# Patient Record
Sex: Female | Born: 1990 | Race: Black or African American | Hispanic: No | Marital: Single
Health system: Midwestern US, Community
[De-identification: ages and names within clinical notes are randomized; demographics above are authoritative.]

## PROBLEM LIST (undated history)

## (undated) ENCOUNTER — Inpatient Hospital Stay (HOSPITAL_COMMUNITY): Payer: Self-pay

## (undated) DIAGNOSIS — J45909 Unspecified asthma, uncomplicated: Secondary | ICD-10-CM

## (undated) DIAGNOSIS — F419 Anxiety disorder, unspecified: Secondary | ICD-10-CM

## (undated) DIAGNOSIS — K589 Irritable bowel syndrome without diarrhea: Secondary | ICD-10-CM

## (undated) DIAGNOSIS — F329 Major depressive disorder, single episode, unspecified: Secondary | ICD-10-CM

## (undated) DIAGNOSIS — R87629 Unspecified abnormal cytological findings in specimens from vagina: Secondary | ICD-10-CM

## (undated) DIAGNOSIS — D649 Anemia, unspecified: Secondary | ICD-10-CM

## (undated) DIAGNOSIS — F32A Depression, unspecified: Secondary | ICD-10-CM

## (undated) DIAGNOSIS — K5909 Other constipation: Secondary | ICD-10-CM

## (undated) HISTORY — PX: NO PAST SURGERIES: SHX2092

---
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
## 2008-05-10 ENCOUNTER — Emergency Department (HOSPITAL_COMMUNITY): Admission: EM | Admit: 2008-05-10 | Discharge: 2008-05-11 | Payer: Self-pay | Admitting: Emergency Medicine

## 2011-02-16 LAB — COMPREHENSIVE METABOLIC PANEL
ALT: 10 U/L (ref 0–35)
Alkaline Phosphatase: 58 U/L (ref 39–117)
BUN: 7 mg/dL (ref 6–23)
CO2: 23 mEq/L (ref 19–32)
GFR calc non Af Amer: >60 mL/min (ref >60)
Glucose, Bld: 89 mg/dL (ref 70–99)
Potassium: 3.6 mEq/L (ref 3.5–5.1)
Sodium: 134 mEq/L — ABNORMAL LOW (ref 135–145)
Total Bilirubin: 0.8 mg/dL (ref 0.3–1.2)

## 2011-02-16 LAB — URINALYSIS, ROUTINE W REFLEX MICROSCOPIC
Bilirubin Urine: NEGATIVE
Glucose, UA: NEGATIVE mg/dL
Ketones, ur: 15 mg/dL — AB
Nitrite: NEGATIVE
Protein, ur: NEGATIVE mg/dL
pH: 6.5 (ref 5.0–8.0)

## 2011-02-16 LAB — URINE MICROSCOPIC-ADD ON

## 2011-02-16 LAB — LIPASE, BLOOD: Lipase: 29 U/L (ref 11–59)

## 2011-02-16 LAB — POCT I-STAT, CHEM 8
Creatinine, Ser: 0.7 mg/dL (ref 0.4–1.2)
HCT: 43 % (ref 36.0–46.0)
Hemoglobin: 14.6 g/dL (ref 12.0–15.0)
Sodium: 142 mEq/L (ref 135–145)
TCO2: 24 mmol/L (ref 0–100)

## 2011-02-16 LAB — CBC
HCT: 40 % (ref 36.0–46.0)
Hemoglobin: 13.3 g/dL (ref 12.0–15.0)
RBC: 4.48 MIL/uL (ref 3.87–5.11)

## 2011-02-16 LAB — DIFFERENTIAL
Basophils Absolute: 0.1 10*3/uL (ref 0.0–0.1)
Basophils Relative: 1 % (ref 0–1)
Eosinophils Absolute: 0 10*3/uL (ref 0.0–0.7)
Neutrophils Relative %: 72 % (ref 43–77)

## 2011-02-16 LAB — POCT PREGNANCY, URINE: Preg Test, Ur: NEGATIVE

## 2012-08-02 NOTE — ED Notes (Signed)
Triage note: pt states she is having pain to bilateral sides and abdominal pain, states she has not been able to have a BM in a few days.

## 2012-08-02 NOTE — ED Provider Notes (Addendum)
HPI Comments:   22 y.o. female presents to the ED C/O constipation and bilateral flank pain starting last night. Her pain got a lot worse tonight so she came in. Pt has not had a BM in the last few days. She has taken Mag Citrate without any help. Pt reports some chronic constipation, states she has to sit on the commode for a long time, frequently has burning pain and notices some blood on the tissue paper. Her LMP was 07/17/12. The patient denies urinary sxs, vaginal pain and discharge and any further symptoms or complaints.     Patient has a past medical history of Asthma. Pt denies hx of DM.   Pt does not have a primary doctor.     Written by Eli Phillips, ED Scribe, as dictated by Cecelia Byars, FNP-C     Patient is a 22 y.o. female presenting with flank pain. The history is provided by the patient. No language interpreter was used.   Flank Pain   This is a new problem. The current episode started yesterday. The problem has been gradually worsening. The problem occurs constantly. Patient reports not work related injury.The pain is associated with no known injury. The pain is present in the left side and right side. The pain is moderate. Associated symptoms include abdominal pain. Pertinent negatives include no abdominal swelling, no bowel incontinence, no perianal numbness, no bladder incontinence, no dysuria, no pelvic pain, no leg pain, no paresthesias, no paresis, no tingling and no weakness.        Past Medical History   Diagnosis Date   ??? Asthma         History reviewed. No pertinent past surgical history.      History reviewed. No pertinent family history.     History     Social History   ??? Marital Status: SINGLE     Spouse Name: N/A     Number of Children: N/A   ??? Years of Education: N/A     Occupational History   ??? Not on file.     Social History Main Topics   ??? Smoking status: Light Tobacco Smoker   ??? Smokeless tobacco: Not on file   ??? Alcohol Use: Yes      Comment: socially   ??? Drug Use: No   ???  Sexually Active: Not on file     Other Topics Concern   ??? Not on file     Social History Narrative   ??? No narrative on file                  ALLERGIES: Review of patient's allergies indicates no known allergies.      Review of Systems   Gastrointestinal: Positive for abdominal pain, constipation, anal bleeding and rectal pain. Negative for nausea, vomiting, abdominal distention and bowel incontinence.   Genitourinary: Positive for flank pain. Negative for bladder incontinence, dysuria, frequency, hematuria, vaginal bleeding, vaginal discharge, difficulty urinating, vaginal pain, menstrual problem and pelvic pain.   Skin: Negative for rash and wound.   Allergic/Immunologic: Negative for immunocompromised state.   Neurological: Negative for tingling, weakness and paresthesias.   Hematological: Does not bruise/bleed easily.   All other systems reviewed and are negative.        Filed Vitals:    08/02/12 2124 08/03/12 0030   BP: 142/70 130/72   Pulse: 91 84   Temp: 99.1 ??F (37.3 ??C)    Resp: 16 16   Height: 5\' 7"  (  1.702 m)    Weight: 67.4 kg (148 lb 9.4 oz)    SpO2: 98% 99%            Physical Exam   Nursing note and vitals reviewed.  Constitutional: She is oriented to person, place, and time. She appears well-developed and well-nourished. No distress.   Pt. Lying quietly on stretcher, texting on phone in NAD.    HENT:   Head: Normocephalic and atraumatic.   Right Ear: External ear normal.   Left Ear: External ear normal.   Nose: Nose normal.   Mouth/Throat: Oropharynx is clear and moist.   Eyes: Conjunctivae and EOM are normal. Pupils are equal, round, and reactive to light.   Neck: Normal range of motion. Neck supple. No JVD present. No tracheal deviation present.   Cardiovascular: Normal rate, regular rhythm, normal heart sounds and intact distal pulses.  Exam reveals no gallop and no friction rub.    No murmur heard.  Pulmonary/Chest: Effort normal and breath sounds normal. No respiratory distress. She has no  wheezes. She has no rales.   Abdominal: Soft. She exhibits no distension and no mass. Bowel sounds are decreased. There is no hepatosplenomegaly. There is generalized tenderness. There is no rebound, no guarding and no CVA tenderness.   Genitourinary: Rectal exam shows internal hemorrhoid and tenderness. Rectal exam shows anal tone normal. Guaiac negative stool.   Musculoskeletal: Normal range of motion. She exhibits no edema and no tenderness.   Neurological: She is alert and oriented to person, place, and time. She has normal reflexes. No cranial nerve deficit.   Skin: Skin is warm and dry. No rash noted.   Psychiatric: She has a normal mood and affect. Her behavior is normal.      RESULTS:  RADIOLOGY FINDINGS  11:39 PM   Abdominal Acute Series with Chest X-ray shows moderate amount of stool in the colon.   Pending review by Radiologist    XR ABD ACUTE W 1 V CHEST    (Results Pending)          LABS REVIEWED:  Labs Reviewed   CBC WITH AUTOMATED DIFF - Abnormal; Notable for the following:     ABS. BASOPHILS 0.1 (*)     All other components within normal limits   METABOLIC PANEL, COMPREHENSIVE - Abnormal; Notable for the following:     BUN 4 (*)     BUN/Creatinine ratio 5 (*)     AST 12 (*)     All other components within normal limits   URINALYSIS W/ RFLX MICROSCOPIC   HCG URINE, QL. - POC   POC URINE PREGNANCY TEST      Recent Results (from the past 12 hour(s))   URINALYSIS W/ RFLX MICROSCOPIC    Collection Time     08/02/12  9:30 PM       Result Value Range    Color YELLOW      Appearance CLEAR      Specific gravity 1.020  1.003 - 1.030      pH (UA) 6.0  5.0 - 8.0      Protein NEGATIVE   NEGATIVE mg/dL    Glucose NEGATIVE   NEGATIVE mg/dL    Ketone NEGATIVE   NEGATIVE mg/dL    Bilirubin NEGATIVE   NEGATIVE    Blood NEGATIVE   NEGATIVE    Urobilinogen 0.2  0.2 - 1.0 EU/dL    Nitrites NEGATIVE   NEGATIVE    Leukocyte Esterase NEGATIVE   NEGATIVE  HCG URINE, QL. - POC    Collection Time     08/02/12  9:39 PM        Result Value Range    Pregnancy test,urine (POC) NEGATIVE   NEGATIVE   CBC WITH AUTOMATED DIFF    Collection Time     08/02/12 10:39 PM       Result Value Range    WBC 7.8  4.6 - 13.2 K/uL    RBC 4.56  4.20 - 5.30 M/uL    HGB 13.7  12.0 - 16.0 g/dL    HCT 16.1  09.6 - 04.5 %    MCV 85.7  74.0 - 97.0 FL    MCH 30.0  24.0 - 34.0 PG    MCHC 35.0  31.0 - 37.0 g/dL    RDW 40.9  81.1 - 91.4 %    PLATELET 235  135 - 420 K/uL    MPV 11.4  9.2 - 11.8 FL    NEUTROPHILS 68  40 - 73 %    LYMPHOCYTES 23  21 - 52 %    MONOCYTES 7  3 - 10 %    EOSINOPHILS 1  0 - 5 %    BASOPHILS 1  0 - 2 %    ABS. NEUTROPHILS 5.3  1.8 - 8.0 K/UL    ABS. LYMPHOCYTES 1.8  0.9 - 3.6 K/UL    ABS. MONOCYTES 0.6  0.05 - 1.2 K/UL    ABS. EOSINOPHILS 0.1  0.0 - 0.4 K/UL    ABS. BASOPHILS 0.1 (*) 0.0 - 0.06 K/UL    DF AUTOMATED     METABOLIC PANEL, COMPREHENSIVE    Collection Time     08/02/12 10:39 PM       Result Value Range    Sodium 144  136 - 145 mmol/L    Potassium 3.7  3.5 - 5.5 mmol/L    Chloride 108  100 - 108 mmol/L    CO2 28  21 - 32 mmol/L    Anion gap 8  3.0 - 18 mmol/L    Glucose 87  74 - 99 mg/dL    BUN 4 (*) 7.0 - 18 MG/DL    Creatinine 7.82  0.6 - 1.3 MG/DL    BUN/Creatinine ratio 5 (*) 12 - 20      GFR est AA >60  >60 ml/min/1.3m2    GFR est non-AA >60  >60 ml/min/1.63m2    Calcium 9.1  8.5 - 10.1 MG/DL    Bilirubin, total 0.3  0.2 - 1.0 MG/DL    ALT 18  95.6 - 21.3 U/L    AST 12 (*) 15 - 37 U/L    Alk. phosphatase 84  50 - 136 U/L    Protein, total 7.5  6.4 - 8.2 g/dL    Albumin 3.9  3.4 - 5.0 g/dL    Globulin 3.6  2.0 - 4.0 g/dL    A-G Ratio 1.1  0.8 - 1.7           MDM     Amount and/or Complexity of Data Reviewed:   Clinical lab tests:  Reviewed and ordered  Tests in the radiology section of CPT??:  Ordered and reviewed  Discussion of test results with the performing providers:  No   Decide to obtain previous medical records or to obtain history from someone other than the patient:  No   Obtain history from someone other than the  patient:  No   Review and summarize  past medical records:  No   Discuss the patient with another provider:  No   Independant visualization of image, tracing, or specimen:  No      Procedures    MEDICATIONS GIVEN:  Medications   magnesium citrate solution 296 mL (296 mL Oral Given 08/03/12 0006)         PROGRESS NOTES:   10:21 PM   Performed initial assessment.     Procedure Note - Rectal Exam:   11:43 PM  Performed by: Cecelia Byars, FNP-C   Rectal exam performed.  Brown stool was collected.  Stool was Hemoccult tested, and found to be heme Negative.   The procedure took 1-15 minutes, and pt tolerated well.       11:48 PM    Derrica Grandpre's  results have been reviewed with her.  She has been counseled regarding her diagnosis, treatment, and plan.  She verbally conveys understanding and agreement of the signs, symptoms, diagnosis, treatment and prognosis and additionally agrees to follow up as discussed.  She also agrees with the care-plan and conveys that all of her questions have been answered.  I have also provided discharge instructions for her that include: educational information regarding their diagnosis and treatment, and list of reasons why they would want to return to the ED prior to their follow-up appointment, should her condition change.       CLINICAL IMPRESSION:    1. Constipation    2. Hemorrhoid              PLAN:  1. D/C home  2. lactulose  3. F/u with PCP, Dr. Cathlean Cower  4. Return if sxs worsen      Written by Eli Phillips, ED Scribe, as dictated by Cecelia Byars, FNP-C      I was personally available for consultation in the emergency department.  I have reviewed the chart and agree with the documentation recorded by the [facility], including the assessment, treatment plan, and disposition.  Darlys Gales, MD

## 2012-08-03 LAB — CBC WITH AUTOMATED DIFF
ABS. BASOPHILS: 0.1 10*3/uL — ABNORMAL HIGH (ref 0.0–0.06)
ABS. EOSINOPHILS: 0.1 10*3/uL (ref 0.0–0.4)
ABS. LYMPHOCYTES: 1.8 10*3/uL (ref 0.9–3.6)
ABS. MONOCYTES: 0.6 10*3/uL (ref 0.05–1.2)
ABS. NEUTROPHILS: 5.3 10*3/uL (ref 1.8–8.0)
BASOPHILS: 1 % (ref 0–2)
EOSINOPHILS: 1 % (ref 0–5)
HCT: 39.1 % (ref 35.0–45.0)
HGB: 13.7 g/dL (ref 12.0–16.0)
LYMPHOCYTES: 23 % (ref 21–52)
MCH: 30 PG (ref 24.0–34.0)
MCHC: 35 g/dL (ref 31.0–37.0)
MCV: 85.7 FL (ref 74.0–97.0)
MONOCYTES: 7 % (ref 3–10)
MPV: 11.4 FL (ref 9.2–11.8)
NEUTROPHILS: 68 % (ref 40–73)
PLATELET: 235 10*3/uL (ref 135–420)
RBC: 4.56 M/uL (ref 4.20–5.30)
RDW: 13.2 % (ref 11.6–14.5)
WBC: 7.8 10*3/uL (ref 4.6–13.2)

## 2012-08-03 LAB — URINALYSIS W/ RFLX MICROSCOPIC
Bilirubin: NEGATIVE
Blood: NEGATIVE
Glucose: NEGATIVE mg/dL
Ketone: NEGATIVE mg/dL
Leukocyte Esterase: NEGATIVE
Nitrites: NEGATIVE
Protein: NEGATIVE mg/dL
Specific gravity: 1.02 (ref 1.003–1.030)
Urobilinogen: 0.2 EU/dL (ref 0.2–1.0)
pH (UA): 6 (ref 5.0–8.0)

## 2012-08-03 LAB — METABOLIC PANEL, COMPREHENSIVE
A-G Ratio: 1.1 (ref 0.8–1.7)
ALT (SGPT): 18 U/L (ref 12.0–78.0)
AST (SGOT): 12 U/L — ABNORMAL LOW (ref 15–37)
Albumin: 3.9 g/dL (ref 3.4–5.0)
Alk. phosphatase: 84 U/L (ref 50–136)
Anion gap: 8 mmol/L (ref 3.0–18)
BUN/Creatinine ratio: 5 — ABNORMAL LOW (ref 12–20)
BUN: 4 MG/DL — ABNORMAL LOW (ref 7.0–18)
Bilirubin, total: 0.3 MG/DL (ref 0.2–1.0)
CO2: 28 mmol/L (ref 21–32)
Calcium: 9.1 MG/DL (ref 8.5–10.1)
Chloride: 108 mmol/L (ref 100–108)
Creatinine: 0.82 MG/DL (ref 0.6–1.3)
GFR est AA: >60 mL/min/{1.73_m2} (ref >60)
GFR est non-AA: >60 mL/min/{1.73_m2} (ref >60)
Globulin: 3.6 g/dL (ref 2.0–4.0)
Glucose: 87 mg/dL (ref 74–99)
Potassium: 3.7 mmol/L (ref 3.5–5.5)
Protein, total: 7.5 g/dL (ref 6.4–8.2)
Sodium: 144 mmol/L (ref 136–145)

## 2012-08-03 LAB — HCG URINE, QL. - POC: Pregnancy test,urine (POC): NEGATIVE

## 2012-08-03 MED ORDER — MAGNESIUM CITRATE ORAL SOLN
ORAL | Status: AC
Start: 2012-08-03 — End: 2012-08-03
  Administered 2012-08-03: 04:00:00 via ORAL

## 2012-08-03 NOTE — ED Notes (Signed)
I have reviewed discharge instructions with the patient.  The patient verbalized understanding.Patient armband removed and shredded.

## 2012-08-05 LAB — URINALYSIS W/ RFLX MICROSCOPIC
Bilirubin: NEGATIVE
Blood: NEGATIVE
Glucose: NEGATIVE mg/dL
Ketone: NEGATIVE mg/dL
Leukocyte Esterase: NEGATIVE
Nitrites: NEGATIVE
Protein: NEGATIVE mg/dL
Specific gravity: 1.015 (ref 1.003–1.030)
Urobilinogen: 0.2 EU/dL (ref 0.2–1.0)
pH (UA): 8.5 — ABNORMAL HIGH (ref 5.0–8.0)

## 2012-08-05 LAB — HCG URINE, QL. - POC: Pregnancy test,urine (POC): NEGATIVE

## 2012-08-05 NOTE — ED Notes (Signed)
"  I was here on Saturday with stomach pain and side pain and trouble going to the bathroom and I still can't go."

## 2012-08-05 NOTE — ED Provider Notes (Signed)
Patient is a 22 y.o. female presenting with abdominal pain.   Abdominal Pain          Past Medical History   Diagnosis Date   ??? Asthma         No past surgical history on file.      No family history on file.     History     Social History   ??? Marital Status: SINGLE     Spouse Name: N/A     Number of Children: N/A   ??? Years of Education: N/A     Occupational History   ??? Not on file.     Social History Main Topics   ??? Smoking status: Light Tobacco Smoker   ??? Smokeless tobacco: Not on file   ??? Alcohol Use: Yes      Comment: socially   ??? Drug Use: No   ??? Sexually Active: Not on file     Other Topics Concern   ??? Not on file     Social History Narrative   ??? No narrative on file                  ALLERGIES: Review of patient's allergies indicates no known allergies.      Review of Systems   Gastrointestinal: Positive for abdominal pain.       Filed Vitals:    08/05/12 1136   BP: 112/73   Pulse: 69   Temp: 98.8 ??F (37.1 ??C)   Resp: 20   Height: 5\' 7"  (1.702 m)   Weight: 67.132 kg (148 lb)   SpO2: 100%            Physical Exam     MDM    Procedures    10:24 PM    I was inadvertently assigned to this patient's treatment team.  I did not see this patient nor did I have any contact with this patient.  I had no involvement during the evaluation, treatment or disposition of this patient.  I am signing off this note to indicate only why my name appeared in the record.  Tami Ribas, MD

## 2012-08-05 NOTE — ED Notes (Signed)
No answer when called to be brought back to treatment room.

## 2014-07-05 ENCOUNTER — Inpatient Hospital Stay
Admit: 2014-07-05 | Discharge: 2014-07-05 | Disposition: A | Discharge status: Home / Self Care | Payer: MEDICAID | Attending: Emergency Medicine

## 2014-07-05 DIAGNOSIS — O200 Threatened abortion: Secondary | ICD-10-CM

## 2014-07-05 LAB — CBC WITH AUTOMATED DIFF
ABS. BASOPHILS: 0 10*3/uL (ref 0.0–0.06)
ABS. EOSINOPHILS: 0.1 10*3/uL (ref 0.0–0.4)
ABS. LYMPHOCYTES: 1.5 10*3/uL (ref 0.9–3.6)
ABS. MONOCYTES: 0.5 10*3/uL (ref 0.05–1.2)
ABS. NEUTROPHILS: 5.7 10*3/uL (ref 1.8–8.0)
BASOPHILS: 0 % (ref 0–2)
EOSINOPHILS: 1 % (ref 0–5)
HCT: 36.3 % (ref 35.0–45.0)
HGB: 12.7 g/dL (ref 12.0–16.0)
LYMPHOCYTES: 19 % — ABNORMAL LOW (ref 21–52)
MCH: 30.5 PG (ref 24.0–34.0)
MCHC: 35 g/dL (ref 31.0–37.0)
MCV: 87.1 FL (ref 74.0–97.0)
MONOCYTES: 7 % (ref 3–10)
MPV: 10.6 FL (ref 9.2–11.8)
NEUTROPHILS: 73 % (ref 40–73)
PLATELET: 231 10*3/uL (ref 135–420)
RBC: 4.17 M/uL — ABNORMAL LOW (ref 4.20–5.30)
RDW: 12.8 % (ref 11.6–14.5)
WBC: 7.7 10*3/uL (ref 4.6–13.2)

## 2014-07-05 LAB — BETA HCG, QT
Beta HCG, QT: 6661 m[IU]/mL — ABNORMAL HIGH (ref 1.0–6.0)
hCG Quant: 6661 m[IU]/mL — ABNORMAL HIGH (ref 1.0–6.0)

## 2014-07-05 LAB — URINALYSIS W/ RFLX MICROSCOPIC
Bilirubin: NEGATIVE
Blood: NEGATIVE
Glucose: NEGATIVE mg/dL
Ketone: NEGATIVE mg/dL
Leukocyte Esterase: NEGATIVE
Nitrites: NEGATIVE
Protein: NEGATIVE mg/dL
Specific gravity: <1.005 — ABNORMAL LOW (ref 1.005–1.030)
Urobilinogen: 0.2 EU/dL (ref 0.2–1.0)
pH (UA): 5.5 (ref 5.0–8.0)

## 2014-07-05 LAB — METABOLIC PANEL, COMPREHENSIVE
A-G Ratio: 1.2 (ref 0.8–1.7)
ALT (SGPT): 22 U/L (ref 13–56)
AST (SGOT): 12 U/L — ABNORMAL LOW (ref 15–37)
Albumin: 4.1 g/dL (ref 3.4–5.0)
Alk. phosphatase: 59 U/L (ref 45–117)
Anion gap: 11 mmol/L (ref 3.0–18)
BUN/Creatinine ratio: 11 — ABNORMAL LOW (ref 12–20)
BUN: 7 MG/DL (ref 7.0–18)
Bilirubin, total: 0.3 MG/DL (ref 0.2–1.0)
CO2: 26 mmol/L (ref 21–32)
Calcium: 8.7 MG/DL (ref 8.5–10.1)
Chloride: 105 mmol/L (ref 100–108)
Creatinine: 0.63 MG/DL (ref 0.6–1.3)
GFR est AA: >60 mL/min/{1.73_m2} (ref >60)
GFR est non-AA: >60 mL/min/{1.73_m2} (ref >60)
Globulin: 3.4 g/dL (ref 2.0–4.0)
Glucose: 81 mg/dL (ref 74–99)
Potassium: 3.5 mmol/L (ref 3.5–5.5)
Protein, total: 7.5 g/dL (ref 6.4–8.2)
Sodium: 142 mmol/L (ref 136–145)

## 2014-07-05 LAB — BLOOD TYPE, (ABO+RH)
ABO/Rh(D): O POS
ABO/Rh: O POS

## 2014-07-05 NOTE — ED Notes (Signed)
Triage note: pt reports being 6 weeks 3 days pregnant. Pt reports she noticed brown discharge while at work, pt then reports she wiped again and it was bright red. Pt also reports slight abdominal cramping. Pt denies saturating pads.   Sepsis Screening completed    (  )Patient meets SIRS criteria.  ( x )Patient does not meet SIRS criteria.      SIRS Criteria is achieved when two or more of the following are present  ? Temperature < 96.8??F (36??C) or > 100.9??F (38.3??C)  ? Heart Rate > 90 beats per minute  ? Respiratory Rate > 20 beats per minute  ? WBC count > 12,000 or <4,000 or > 10% bands      (  )Patient has a suspected source of infection.  ( x )Patient does not have a suspected source of infection.

## 2014-07-05 NOTE — ED Notes (Signed)
Pt given a warm blanket, call light with in reach. Pt reports she just voided as this rn asks her to provide a urine specimen. . Pt verbalizes understanding of providing a specimen as soon as she can.   Pt also informed the provider during the initial exam that pt has had an ultra sound to confirm her pregnancy.

## 2014-07-05 NOTE — ED Provider Notes (Signed)
HPI Comments:   3:16pm    24 y.o. [redacted] weeks pregnant female, G1P0A0 presents to the ED C/O abdominal pain and spotting, onset today. Seen by OB on the 17th, had ultrasound, everything normal. Associated symptoms include back pain. Hx of asthma. Hx of social alcohol and smoking, denies current smoking or drinking. Pt denies any other symptoms or complaints.     The history is provided by the patient. No language interpreter was used.        Past Medical History:   Diagnosis Date   ??? Asthma        History reviewed. No pertinent past surgical history.      History reviewed. No pertinent family history.    History     Social History   ??? Marital Status: SINGLE     Spouse Name: N/A   ??? Number of Children: N/A   ??? Years of Education: N/A     Occupational History   ??? Not on file.     Social History Main Topics   ??? Smoking status: Light Tobacco Smoker   ??? Smokeless tobacco: Not on file   ??? Alcohol Use: Yes      Comment: socially   ??? Drug Use: No   ??? Sexual Activity: Not on file     Other Topics Concern   ??? Not on file     Social History Narrative           ALLERGIES: Review of patient's allergies indicates no known allergies.      Review of Systems   Constitutional: Negative for fever and fatigue.   HENT: Negative for rhinorrhea and sore throat.    Respiratory: Negative for cough and shortness of breath.    Cardiovascular: Negative for chest pain and palpitations.   Gastrointestinal: Positive for abdominal pain. Negative for nausea, vomiting and diarrhea.   Genitourinary: Positive for vaginal bleeding. Negative for dysuria and difficulty urinating.   Musculoskeletal: Positive for back pain. Negative for myalgias and arthralgias.   Skin: Negative for color change and rash.   Neurological: Negative for light-headedness and headaches.   All other systems reviewed and are negative.      Filed Vitals:    07/05/14 1507 07/05/14 1704   BP: 125/86 117/64   Pulse: 94 76   Temp: 99.2 ??F (37.3 ??C)    Resp: 18 18    Height: 5\' 5"  (1.651 m)    Weight: 69.854 kg (154 lb)    SpO2: 98% 99%            Physical Exam   Nursing note and vitals reviewed.   GEN:  Nontoxic appearing; Alert and Oriented;  Appears well hydrated.  SKIN:  Warm and dry, no rashes. No petechia. Good skin turgor.  HEAD:  Normocephalic and Atraumatic;    ENT: Oral mucosa moist, pharynx clear;   NECK:  Supple. Trachea is Midline; No adenopathy.   HEART:  Regular rate and rhythm No murmur. No rubs.  LUNGS:  Clear bilaterally;  Normal respiratory effort,  ABD:  Normoactive bowel sounds.  Soft, slight tenderness suprapubic.  No organomegaly. No hernias.  BACK: No obvious bony spinal defects or changes; Non tender paraspinous muscles  GU: Normal inspection; no rash; NO CVAT  EXT:  No obvious soft tissue tenderness or sites of bony tenderness; Joints- good ROM  NEURO: CN- intact; No focal motor deficits; Cerebellar function- intact; DTR's - 2+     MDM  Number of Diagnoses or Management Options  Diagnosis management comments: INITIAL CLINICAL IMPRESSION and PLANS:  The patient presents with the primary complaint(s) [id number] spotting. The presentation, to include historical aspects and clinical findings are consistent with the DX of Threatened miscarriage. However, other possible DX's to consider as primary, associated with, or exacerbated by include:    Considering the above, my initial management plan to evaluate and therapeutic interventions include the following and as noted in the orders:    1.  Labs:   2.  Imaging:  3.  Medications:     Pennelope Bracken, MD       Amount and/or Complexity of Data Reviewed  Clinical lab tests: ordered and reviewed (CMP, UA, CBC)        Pelvic Exam  Date/Time: 07/05/2014 3:21 PM  Performed by: attending  Exam assisted by:  female present.  Type of exam performed: bimanual and speculum.    External genitalia appearance: normal.    Vaginal exam:  bleeding.    Bleeding: mild   Cervical exam:  normal, os closed and no cervical motion tenderness.    Bimanual exam:  normal and uterine enlargement.    Patient tolerance: Patient tolerated the procedure well with no immediate complications      ED CLINICAL SUMMARY - DISCHARGE     3:24 PM  CLINICAL COURSE while in the ED:      Intervention)s) while in ED:  ACTIONS / APPROACH: Based on the presenting ACUTE history of VAGINAL BLEEDING My initial focus was to Determine the cause and extent of the problem and Initiate Treatment as Appropriate .  Details of actions taken are noted below.       1. DIAGNOSTIC RESULTS:   No orders to display            Labs Reviewed   TOTAL HCG, QT. - Abnormal; Notable for the following:     HCG, Qt. 6661 (*)     All other components within normal limits   CBC WITH AUTOMATED DIFF - Abnormal; Notable for the following:     RBC 4.17 (*)     LYMPHOCYTES 19 (*)     All other components within normal limits   METABOLIC PANEL, COMPREHENSIVE - Abnormal; Notable for the following:     BUN/Creatinine ratio 11 (*)     AST 12 (*)     All other components within normal limits   URINALYSIS W/ RFLX MICROSCOPIC - Abnormal; Notable for the following:     Specific gravity <1.005 (*)     All other components within normal limits   TYPE, ABO & RH         No results found for this or any previous visit (from the past 12 hour(s)).    2. MEDICATIONS GIVEN: Medications - No data to display     Response to Intervention(s):   IMPROVED       Unanticipated Developments: NONE     ED COURSE - General Comment:  During the ED course I had re-evaluated the patient, answered their and /or their family's questions regarding my clinical impression, the patient's condition and plans for therapeutic interventions. The patient's ED course was uneventful and remained stable throughout.    CLINICAL IMPRESSION AND DISCUSSION:   I reviewed our electronic medical record system for any past medical  records that were available that may contribute to the patients current condition, the nursing notes and vital signs from today's visit. Based on the clinical presentation, findings and results of diagnostic studies, as well as  developments while in the ED,  I suspect the following:        For the presentation noted above    The most likely cause or diagnosis is: THREATEN MISCARRAGE        DISCUSSION REGARDING DIAGNOSIS: The specifics regarding my clinical impression / diagnosis are as follows:     At this time there is no clinical evidence to support other pertinent diagnostic considerations such as:   N/A    Finally, other diagnostic considerations during this visit are noted below in Clinical Impression.       Specific Conversations:  NONE      DISPOSITION DECISION:     DISCHARGE: I feel that we have optimized outpatient assessment and management such that Remee Charley is stable to be discharged and to continue with her care or complete any additional evaluation as appropriate at home or as an outpatient. Preparations will be made to discharge the patient.    Present condition at the time of disposition: STABLE    DISCUSSION REGARDING THE DISPOSITION:         DISCHARGE NOTE:    Joei Johndrow's  results have been reviewed with her.  She has been counseled regarding her diagnosis, treatment, and plan.  She verbally conveys understanding and agreement of the signs, symptoms, diagnosis, treatment and prognosis and additionally agrees to follow up as discussed.  She also agrees with the care-plan and conveys that all of her questions have been answered.  I have also provided discharge instructions for her that include: educational information regarding their diagnosis and treatment, and list of reasons why they would want to return to the ED prior to their follow-up appointment, should her condition change.      CLINICAL IMPRESSION  1. Threatened miscarriage in early pregnancy        PLAN:  1. D/C home  2.    Discharge Medication List as of 07/05/2014  4:56 PM      CONTINUE these medications which have NOT CHANGED    Details   hydrocortisone (ANUSOL-HC) 2.5 % rectal cream Insert  into rectum four (4) times daily., Print, Disp-30 g, R-0           3.   Follow-up Information     Follow up With Details Comments Contact Info    [facility] EMERGENCY DEPT  As needed, If symptoms worsen [address]  [place] [address]  [phone]    Betsy Coder, MD  Follow up   [address]  [facility]  [address]  [place] [address]  [phone]          Return if sxs worsen    ATTESTATIONS:  This note is prepared by Tami Ribas, MD, acting as Scribe for Tami Ribas, MD.    Tami Ribas, MD: The scribe's documentation has been prepared under my direction and personally reviewed by me in its entirety. I confirm that the note above accurately reflects all work, treatment, procedures, and medical decision making performed by me.

## 2014-07-05 NOTE — ED Notes (Signed)
I have reviewed discharge instructions with the patient.  The patient verbalized understanding.  Patient armband removed and shredded.

## 2014-11-01 ENCOUNTER — Inpatient Hospital Stay
Admit: 2014-11-01 | Discharge: 2014-11-01 | Disposition: A | Discharge status: Home / Self Care | Payer: MEDICAID | Attending: Emergency Medicine

## 2014-11-01 DIAGNOSIS — S335XXA Sprain of ligaments of lumbar spine, initial encounter: Secondary | ICD-10-CM

## 2014-11-01 LAB — URINALYSIS W/ RFLX MICROSCOPIC
Bilirubin: NEGATIVE
Blood: NEGATIVE
Glucose: NEGATIVE mg/dL
Ketone: NEGATIVE mg/dL
Leukocyte Esterase: NEGATIVE
Nitrites: NEGATIVE
Protein: NEGATIVE mg/dL
Specific gravity: 1.013 (ref 1.005–1.030)
Urobilinogen: 0.2 EU/dL (ref 0.2–1.0)
pH (UA): 5.5 (ref 5.0–8.0)

## 2014-11-01 LAB — HCG URINE, QL: HCG urine, QL: NEGATIVE

## 2014-11-01 MED ORDER — OXYCODONE-ACETAMINOPHEN 5 MG-325 MG TAB
5-325 mg | ORAL_TABLET | ORAL | Status: AC | PRN
Start: 2014-11-01 — End: ?

## 2014-11-01 MED ORDER — OXYCODONE-ACETAMINOPHEN 5 MG-325 MG TAB
5-325 mg | ORAL | Status: DC
Start: 2014-11-01 — End: 2014-11-01

## 2014-11-01 MED ORDER — CYCLOBENZAPRINE 10 MG TAB
10 mg | ORAL_TABLET | Freq: Three times a day (TID) | ORAL | Status: AC | PRN
Start: 2014-11-01 — End: ?

## 2014-11-01 MED FILL — OXYCODONE-ACETAMINOPHEN 5 MG-325 MG TAB: 5-325 mg | ORAL | Qty: 1

## 2014-11-01 NOTE — ED Notes (Signed)
Assumed care for discharge instructions.  Patient is awake, alert x 4 with quite unlabored respirations.  Home care instructions, work note for 3 days, and prescriptions x 2 were explained to patient to include warnings about driving.  Patient was ambulatory to the exit with an even steady gait

## 2014-11-01 NOTE — ED Notes (Signed)
2 weeks back pain, denies injury, increase pain this past week. "i cant get out of bed or get dressed by myself"

## 2014-11-01 NOTE — ED Provider Notes (Signed)
HPI Comments: Cheryl Benton is a  24 y.o. Normal build african [place] female smoker h/o Asthma presents to the ED via POV c/o constant non-radiating lower back pain x 2 weeks, pain increases with movement. Denies fever, chills, dizziness, HA, neck pain/stiffness, cp, sob, palps, cough, leg swelling, abd pain, n/v/d, constipation, brbpr, melena, loss of bowel/bladder control, flank pain, blood in urine, saddle anesthesia, numbness/tingling, weakness, difficulty ambulating.              Patient is a 24 y.o. female presenting with back pain.   Back Pain   Pertinent negatives include no chest pain, no fever, no numbness, no headaches, no abdominal pain, no dysuria and no weakness.        Past Medical History:   Diagnosis Date   ??? Asthma    ??? Miscarriage within last 12 months February 2016       History reviewed. No pertinent past surgical history.      History reviewed. No pertinent family history.    History     Social History   ??? Marital Status: SINGLE     Spouse Name: N/A   ??? Number of Children: N/A   ??? Years of Education: N/A     Occupational History   ??? Not on file.     Social History Main Topics   ??? Smoking status: Light Tobacco Smoker   ??? Smokeless tobacco: Not on file   ??? Alcohol Use: Yes      Comment: socially   ??? Drug Use: No   ??? Sexual Activity: Not on file     Other Topics Concern   ??? Not on file     Social History Narrative         ALLERGIES: Review of patient's allergies indicates no known allergies.    Review of Systems   Constitutional: Negative for fever and chills.   HENT: Negative for ear pain and sore throat.    Eyes: Negative for pain and visual disturbance.   Respiratory: Negative for cough and shortness of breath.    Cardiovascular: Negative for chest pain and palpitations.   Gastrointestinal: Negative for nausea, vomiting, abdominal pain and diarrhea.   Genitourinary: Negative for dysuria, urgency and frequency.   Musculoskeletal: Positive for back pain. Negative for joint swelling,  arthralgias, neck pain and neck stiffness.   Skin: Negative for color change, pallor, rash and wound.   Neurological: Negative for dizziness, weakness, numbness and headaches.   Psychiatric/Behavioral: Negative for behavioral problems. The patient is not nervous/anxious.        Filed Vitals:    11/01/14 0803   BP: 126/82   Pulse: 90   Temp: 97.7 ??F (36.5 ??C)   Resp: 16   Height:  (1.676 m)   Weight: 68.493 kg (151 lb)   SpO2: 98%            Physical Exam   Constitutional: She is oriented to person, place, and time. She appears well-developed and well-nourished. No distress.   Cardiovascular: Normal rate, regular rhythm, normal heart sounds and intact distal pulses.  Exam reveals no gallop and no friction rub.    No murmur heard.  Pulmonary/Chest: Effort normal and breath sounds normal. No respiratory distress. She has no wheezes. She has no rales.   Abdominal: Soft. Bowel sounds are normal. There is no tenderness. There is no rebound and no guarding.   Musculoskeletal:        Cervical back: Normal.  Thoracic back: Normal.        Lumbar back: She exhibits tenderness, pain and spasm. She exhibits normal range of motion, no bony tenderness, no swelling, no edema, no deformity, no laceration and normal pulse.   + Paraspinal muscle bilateral TTP. Normal inspection. No midline TTP. FROM w/ pain on flexion. MS 5/5 throughout BUE/BLE. Normal gait. Neg SLR.    Neurological: She is alert and oriented to person, place, and time. She has normal strength. She is not disoriented. No sensory deficit.   MS 5/5 throughout BUE/BLE. Normal gait.    Skin: Skin is warm and dry. No rash noted. She is not diaphoretic.   Nursing note and vitals reviewed.       MDM  Number of Diagnoses or Management Options  Sprain of lumbar region, initial encounter: new and requires workup  Diagnosis management comments: DDX: Sciatica, Lumbar radiculopathy, Back Sprain, Back Strain, DJD, HNP, Epidural abscess, Cauda Equina Syndrome,  Contusion, Pyelonephritis, Renal Colic, Rib Fracture, Rib Contusion, Costochondritis, Pleurisy, Pleurodynia, Pneumothorax, Thoracic Aneurysm, Abdominal Aortic Aneurysm, Nephro-ureteral Calculus, Acute Myocardial Infarction.     Afebrile, no focal neuro deficits. Normal gait.     Reviewed workup results, any meds, and discharge instructions OR admission plan with patient and any family present.  Answered all questions. Michaelyn Barter, PA  9:47 AM    PLEASE FOLLOW-UP AS DIRECTED WITHOUT FAIL WITHIN THE TIME FRAME RECOMMENDED AS FAILURE TO DO SO COULD RESULT IN WORSENING OF YOUR PHYSICAL CONDITION, DEATH, AND OR PERMANENT DISABILITY.     RETURN TO THE EMERGENCY DEPARTMENT AT IF YOU ARE UNABLE TO FOLLOW-UP AS DIRECTED.     RETURN TO THE EMERGENCY DEPARTMENT AT ONCE IF YOU HAVE SYMPTOMS THAT DO NOT IMPROVE WITH TREATMENT, NEW SYMPTOMS, WORSENING SYMPTOMS, OR ANY OTHER CONCERNS.     THE PATIENT AGREES WITH THE DISCHARGE PLAN AND FOLLOW-UP INSTRUCTIONS. THE PATIENT AGREES TO REVIEW ALL HANDOUTS.          Amount and/or Complexity of Data Reviewed  Clinical lab tests: reviewed and ordered  Tests in the medicine section of CPT??: reviewed and ordered  Decide to obtain previous medical records or to obtain history from someone other than the patient: yes  Review and summarize past medical records: yes  Independent visualization of images, tracings, or specimens: yes    Risk of Complications, Morbidity, and/or Mortality  Presenting problems: moderate  Diagnostic procedures: moderate  Management options: moderate    Patient Progress  Patient progress: stable      Procedures    Diagnosis:   1. Sprain of lumbar region, initial encounter          Disposition: HOME    Follow-up Information     Follow up With Details Comments Contact Info    [facility] EMERGENCY DEPT  As needed, If symptoms worsen [address] [place] [address]  [phone]    [facility] Call today Orthopedic:  Schedule appointment to be seen within 2-3 days.  [address]  [place] [address]  [phone]    Caroleen Hamman, MD Call today Primary Care Physician: Schedule appointment to be seen within 1 week for re-evaluation and establish Primary Care Physician.  [address]    Carola Rhine [place] [address]  [phone]            Current Discharge Medication List      START taking these medications  Details   oxyCODONE-acetaminophen (PERCOCET) 5-325 mg per tablet Take 1 Tab by mouth every four (4) hours as needed (BREAKTHROUGH PAIN ONLY, CAUTION MAY CAUSE DROWSINESS.). Max Daily Amount: 6 Tabs.  Qty: 20 Tab, Refills: 0      cyclobenzaprine (FLEXERIL) 10 mg tablet Take 1 Tab by mouth three (3) times daily as needed for Muscle Spasm(s).  Qty: 20 Tab, Refills: 0         CONTINUE these medications which have NOT CHANGED    Details   multivitamin (ONE A DAY) tablet Take 1 Tab by mouth daily.             Recent Results (from the past 24 hour(s))   URINALYSIS W/ RFLX MICROSCOPIC    Collection Time: 11/01/14  9:10 AM   Result Value Ref Range    Color YELLOW      Appearance CLEAR      Specific gravity 1.013 1.005 - 1.030      pH (UA) 5.5 5.0 - 8.0      Protein NEGATIVE  NEG mg/dL    Glucose NEGATIVE  NEG mg/dL    Ketone NEGATIVE  NEG mg/dL    Bilirubin NEGATIVE  NEG      Blood NEGATIVE  NEG      Urobilinogen 0.2 0.2 - 1.0 EU/dL    Nitrites NEGATIVE  NEG      Leukocyte Esterase NEGATIVE  NEG     HCG URINE, QL    Collection Time: 11/01/14  9:10 AM   Result Value Ref Range    HCG urine, Ql. NEGATIVE  NEG

## 2015-01-22 ENCOUNTER — Inpatient Hospital Stay
Admit: 2015-01-22 | Discharge: 2015-01-23 | Disposition: A | Discharge status: Home / Self Care | Payer: Self-pay | Attending: Emergency Medicine

## 2015-01-22 DIAGNOSIS — S39012A Strain of muscle, fascia and tendon of lower back, initial encounter: Secondary | ICD-10-CM

## 2015-01-22 MED ORDER — METHOCARBAMOL 750 MG TAB
750 mg | ORAL_TABLET | ORAL | 0 refills | Status: AC
Start: 2015-01-22 — End: ?

## 2015-01-22 MED ORDER — IBUPROFEN 600 MG TAB
600 mg | ORAL_TABLET | Freq: Four times a day (QID) | ORAL | 0 refills | Status: AC | PRN
Start: 2015-01-22 — End: ?

## 2015-01-22 MED ORDER — IBUPROFEN 400 MG TAB
400 mg | ORAL | Status: AC
Start: 2015-01-22 — End: 2015-01-22
  Administered 2015-01-22: 21:00:00 via ORAL

## 2015-01-22 MED ORDER — METHOCARBAMOL 750 MG TAB
750 mg | ORAL | Status: AC
Start: 2015-01-22 — End: 2015-01-22
  Administered 2015-01-22: 21:00:00 via ORAL

## 2015-01-22 MED FILL — METHOCARBAMOL 750 MG TAB: 750 mg | ORAL | Qty: 1

## 2015-01-22 MED FILL — IBUPROFEN 400 MG TAB: 400 mg | ORAL | Qty: 1
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
## 2015-03-28 ENCOUNTER — Emergency Department (HOSPITAL_COMMUNITY)
Admission: EM | Admit: 2015-03-28 | Discharge: 2015-03-28 | Discharge status: Against Medical Advice | Payer: Medicaid - Out of State | Attending: Emergency Medicine | Admitting: Emergency Medicine

## 2015-03-28 ENCOUNTER — Encounter (HOSPITAL_COMMUNITY): Payer: Self-pay | Admitting: *Deleted

## 2015-03-28 DIAGNOSIS — F17210 Nicotine dependence, cigarettes, uncomplicated: Secondary | ICD-10-CM | POA: Insufficient documentation

## 2015-03-28 DIAGNOSIS — J45909 Unspecified asthma, uncomplicated: Secondary | ICD-10-CM | POA: Insufficient documentation

## 2015-03-28 DIAGNOSIS — N644 Mastodynia: Secondary | ICD-10-CM | POA: Insufficient documentation

## 2015-03-28 HISTORY — DX: Unspecified asthma, uncomplicated: J45.909

## 2015-03-28 NOTE — ED Notes (Signed)
Pt reports taking a shower this am and noticed white discharge from left breast. Pt reports pain and that discharge is now bloody. No acute distress noted at triage.

## 2015-08-11 ENCOUNTER — Emergency Department (HOSPITAL_COMMUNITY): Payer: Self-pay

## 2015-08-11 ENCOUNTER — Emergency Department (HOSPITAL_COMMUNITY)
Admission: EM | Admit: 2015-08-11 | Discharge: 2015-08-11 | Disposition: A | Discharge status: Home / Self Care | Payer: Self-pay | Attending: Emergency Medicine | Admitting: Emergency Medicine

## 2015-08-11 ENCOUNTER — Encounter (HOSPITAL_COMMUNITY): Payer: Self-pay

## 2015-08-11 DIAGNOSIS — N390 Urinary tract infection, site not specified: Secondary | ICD-10-CM

## 2015-08-11 DIAGNOSIS — R109 Unspecified abdominal pain: Secondary | ICD-10-CM

## 2015-08-11 DIAGNOSIS — K5900 Constipation, unspecified: Secondary | ICD-10-CM

## 2015-08-11 DIAGNOSIS — F17210 Nicotine dependence, cigarettes, uncomplicated: Secondary | ICD-10-CM | POA: Insufficient documentation

## 2015-08-11 DIAGNOSIS — Z3202 Encounter for pregnancy test, result negative: Secondary | ICD-10-CM | POA: Insufficient documentation

## 2015-08-11 DIAGNOSIS — Z79899 Other long term (current) drug therapy: Secondary | ICD-10-CM | POA: Insufficient documentation

## 2015-08-11 DIAGNOSIS — J45909 Unspecified asthma, uncomplicated: Secondary | ICD-10-CM | POA: Insufficient documentation

## 2015-08-11 DIAGNOSIS — R112 Nausea with vomiting, unspecified: Secondary | ICD-10-CM | POA: Insufficient documentation

## 2015-08-11 LAB — URINALYSIS, ROUTINE W REFLEX MICROSCOPIC
Bilirubin Urine: NEGATIVE
GLUCOSE, UA: NEGATIVE mg/dL
Hgb urine dipstick: NEGATIVE
KETONES UR: NEGATIVE mg/dL
Nitrite: POSITIVE — AB
PROTEIN: NEGATIVE mg/dL
Specific Gravity, Urine: 1.011 (ref 1.005–1.030)
pH: 6 (ref 5.0–8.0)

## 2015-08-11 LAB — COMPREHENSIVE METABOLIC PANEL
ALK PHOS: 55 U/L (ref 38–126)
ALT: 8 U/L — AB (ref 14–54)
AST: 12 U/L — AB (ref 15–41)
Albumin: 4.4 g/dL (ref 3.5–5.0)
Anion gap: 9 (ref 5–15)
BILIRUBIN TOTAL: 0.2 mg/dL — AB (ref 0.3–1.2)
BUN: 8 mg/dL (ref 6–20)
CALCIUM: 9.4 mg/dL (ref 8.9–10.3)
CO2: 25 mmol/L (ref 22–32)
CREATININE: 0.5 mg/dL (ref 0.44–1.00)
Chloride: 108 mmol/L (ref 101–111)
Glucose, Bld: 92 mg/dL (ref 65–99)
Potassium: 3.9 mmol/L (ref 3.5–5.1)
Sodium: 142 mmol/L (ref 135–145)
TOTAL PROTEIN: 7.1 g/dL (ref 6.5–8.1)

## 2015-08-11 LAB — URINE MICROSCOPIC-ADD ON

## 2015-08-11 LAB — I-STAT BETA HCG BLOOD, ED (MC, WL, AP ONLY)

## 2015-08-11 LAB — CBC
HCT: 36.2 % (ref 36.0–46.0)
Hemoglobin: 12.6 g/dL (ref 12.0–15.0)
MCH: 29.7 pg (ref 26.0–34.0)
MCHC: 34.8 g/dL (ref 30.0–36.0)
MCV: 85.4 fL (ref 78.0–100.0)
PLATELETS: 260 10*3/uL (ref 150–400)
RBC: 4.24 MIL/uL (ref 3.87–5.11)
RDW: 13.8 % (ref 11.5–15.5)
WBC: 8 10*3/uL (ref 4.0–10.5)

## 2015-08-11 LAB — LIPASE, BLOOD: Lipase: 31 U/L (ref 11–51)

## 2015-08-11 MED ORDER — SULFAMETHOXAZOLE-TRIMETHOPRIM 800-160 MG PO TABS: 1.0000 | ORAL_TABLET | Freq: Two times a day (BID) | ORAL | Status: DC

## 2015-08-11 MED ORDER — ONDANSETRON 8 MG PO TBDP: 8.0000 mg | ORAL_TABLET | Freq: Three times a day (TID) | ORAL | Status: DC | PRN

## 2015-08-11 NOTE — ED Notes (Signed)
EDP at bedside  

## 2015-08-11 NOTE — Discharge Instructions (Signed)
Continue the mineral lax and Colace. Follow-up with gastroenterology as needed.  Abdominal Pain, Adult Many things can cause abdominal pain. Usually, abdominal pain is not caused by a disease and will improve without treatment. It can often be observed and treated at home. Your health care provider will do a physical exam and possibly order blood tests and X-rays to help determine the seriousness of your pain. However, in many cases, more time must pass before a clear cause of the pain can be found. Before that point, your health care provider may not know if you need more testing or further treatment. HOME CARE INSTRUCTIONS Monitor your abdominal pain for any changes. The following actions may help to alleviate any discomfort you are experiencing:  Only take over-the-counter or prescription medicines as directed by your health care provider.  Do not take laxatives unless directed to do so by your health care provider.  Try a clear liquid diet (broth, tea, or water) as directed by your health care provider. Slowly move to a bland diet as tolerated. SEEK MEDICAL CARE IF:  You have unexplained abdominal pain.  You have abdominal pain associated with nausea or diarrhea.  You have pain when you urinate or have a bowel movement.  You experience abdominal pain that wakes you in the night.  You have abdominal pain that is worsened or improved by eating food.  You have abdominal pain that is worsened with eating fatty foods.  You have a fever. SEEK IMMEDIATE MEDICAL CARE IF:  Your pain does not go away within 2 hours.  You keep throwing up (vomiting).  Your pain is felt only in portions of the abdomen, such as the right side or the left lower portion of the abdomen.  You pass bloody or black tarry stools. MAKE SURE YOU:  Understand these instructions.  Will watch your condition.  Will get help right away if you are not doing well or get worse.   This information is not intended to  replace advice given to you by your health care provider. Make sure you discuss any questions you have with your health care provider.   Document Released: 02/07/2005 Document Revised: 01/19/2015 Document Reviewed: 01/07/2013 Elsevier Interactive Patient Education 2016 [employer]  Constipation, Adult Constipation is when a person has fewer than three bowel movements a week, has difficulty having a bowel movement, or has stools that are dry, hard, or larger than normal. As people grow older, constipation is more common. A low-fiber diet, not taking in enough fluids, and taking certain medicines may make constipation worse.  CAUSES   Certain medicines, such as antidepressants, pain medicine, iron supplements, antacids, and water pills.   Certain diseases, such as diabetes, irritable bowel syndrome (IBS), thyroid disease, or depression.   Not drinking enough water.   Not eating enough fiber-rich foods.   Stress or travel.   Lack of physical activity or exercise.   Ignoring the urge to have a bowel movement.   Using laxatives too much.  SIGNS AND SYMPTOMS   Having fewer than three bowel movements a week.   Straining to have a bowel movement.   Having stools that are hard, dry, or larger than normal.   Feeling full or bloated.   Pain in the lower abdomen.   Not feeling relief after having a bowel movement.  DIAGNOSIS  Your health care provider will take a medical history and perform a physical exam. Further testing may be done for severe constipation. Some tests may include:  A barium enema X-ray to examine your rectum, colon, and, sometimes, your small intestine.   A sigmoidoscopy to examine your lower colon.   A colonoscopy to examine your entire colon. TREATMENT  Treatment will depend on the severity of your constipation and what is causing it. Some dietary treatments include drinking more fluids and eating more fiber-rich foods. Lifestyle treatments  may include regular exercise. If these diet and lifestyle recommendations do not help, your health care provider may recommend taking over-the-counter laxative medicines to help you have bowel movements. Prescription medicines may be prescribed if over-the-counter medicines do not work.  HOME CARE INSTRUCTIONS   Eat foods that have a lot of fiber, such as fruits, vegetables, whole grains, and beans.  Limit foods high in fat and processed sugars, such as french fries, hamburgers, cookies, candies, and soda.   A fiber supplement may be added to your diet if you cannot get enough fiber from foods.   Drink enough fluids to keep your urine clear or pale yellow.   Exercise regularly or as directed by your health care provider.   Go to the restroom when you have the urge to go. Do not hold it.   Only take over-the-counter or prescription medicines as directed by your health care provider. Do not take other medicines for constipation without talking to your health care provider first.  SEEK IMMEDIATE MEDICAL CARE IF:   You have bright red blood in your stool.   Your constipation lasts for more than 4 days or gets worse.   You have abdominal or rectal pain.   You have thin, pencil-like stools.   You have unexplained weight loss. MAKE SURE YOU:   Understand these instructions.  Will watch your condition.  Will get help right away if you are not doing well or get worse.   This information is not intended to replace advice given to you by your health care provider. Make sure you discuss any questions you have with your health care provider.   Document Released: 01/27/2004 Document Revised: 05/21/2014 Document Reviewed: 02/09/2013 Elsevier Interactive Patient Education [employer]

## 2015-08-11 NOTE — ED Notes (Addendum)
Pt c/o generalized abdominal pain and constipation x 2 weeks and "dark" urine, emesis, and diarrhea x 4 days.  Pain score 4/10 w/ "painful spasms."  Pt has been taking OTC medications w/o relief.  Denies Hx of IBS or SBO.  Sts "my poo is just balls."

## 2015-08-11 NOTE — ED Notes (Signed)
Pt to xray

## 2015-08-11 NOTE — ED Provider Notes (Signed)
CSN: [id number]     Arrival date & time 08/11/15  1131 History   First MD Initiated Contact with Patient 08/11/15 1531     Chief Complaint  Patient presents with  . Constipation  . Abdominal Pain     Patient is a 25 y.o. female presenting with constipation and abdominal pain. The history is provided by the patient.  Constipation Associated symptoms: abdominal pain, nausea and vomiting   Abdominal Pain Associated symptoms: constipation, nausea and vomiting   Associated symptoms: no chest pain and no shortness of breath   Patient presents with constipation or abdominal pain. She's had some constipation worse for the last 2 weeks but she does struggle with chronic constipation. No fevers. She states she has not taken many different medicines to help but still has had hard balls of stool. States she's also had some diarrhea around it but still had some pulsatile stool. She's had some dull abdominal pain. No fevers. No dysuria.  Past Medical History  Diagnosis Date  . Asthma    History reviewed. No pertinent past surgical history. History reviewed. No pertinent family history. Social History  Substance Use Topics  . Smoking status: Current Every Day Smoker    Types: Cigarettes  . Smokeless tobacco: None  . Alcohol Use: Yes     Comment: occ   OB History    No data available     Review of Systems  Constitutional: Negative for appetite change.  Respiratory: Negative for shortness of breath and stridor.   Cardiovascular: Negative for chest pain.  Gastrointestinal: Positive for nausea, vomiting, abdominal pain and constipation.  Genitourinary: Negative for flank pain.  Skin: Negative for wound.  Neurological: Negative for seizures.      Allergies  Review of patient's allergies indicates no known allergies.  Home Medications   Prior to Admission medications   Medication Sig Start Date End Date Taking? Authorizing Provider  albuterol (PROVENTIL HFA;VENTOLIN HFA) 108 (90  Base) MCG/ACT inhaler Inhale 2 puffs into the lungs every 6 (six) hours as needed for wheezing or shortness of breath.   Yes Historical Provider, MD  docusate sodium (COLACE) 100 MG capsule Take 200 mg by mouth daily as needed for mild constipation or moderate constipation.   Yes Historical Provider, MD  polyethylene glycol (MIRALAX / GLYCOLAX) packet Take 17 g by mouth daily as needed for mild constipation or moderate constipation.   Yes Historical Provider, MD  Probiotic Product (PROBIOTIC DAILY PO) Take 1 capsule by mouth daily.   Yes Historical Provider, MD   BP 119/80 mmHg  Pulse 79  Temp(Src) 98 F (36.7 C) (Oral)  Resp 16  SpO2 100%  LMP 08/09/2015 Physical Exam  Constitutional: She appears well-developed.  Neck: Neck supple.  Cardiovascular: Normal rate.   Pulmonary/Chest: Effort normal.  Abdominal: There is tenderness.  Mild diffuse abdominal pain without rebound or guarding.  Musculoskeletal: She exhibits no edema.  Neurological: She is alert.  Skin: Skin is warm.    ED Course  Procedures (including critical care time) Labs Review Labs Reviewed  COMPREHENSIVE METABOLIC PANEL - Abnormal; Notable for the following:    AST 12 (*)    ALT 8 (*)    Total Bilirubin 0.2 (*)    All other components within normal limits  URINALYSIS, ROUTINE W REFLEX MICROSCOPIC (NOT AT [facility]) - Abnormal; Notable for the following:    APPearance CLOUDY (*)    Nitrite POSITIVE (*)    Leukocytes, UA SMALL (*)    All other  components within normal limits  URINE MICROSCOPIC-ADD ON - Abnormal; Notable for the following:    Squamous Epithelial / LPF 0-5 (*)    Bacteria, UA MANY (*)    All other components within normal limits  LIPASE, BLOOD  CBC  I-STAT BETA HCG BLOOD, ED ([facility], [facility], [facility] ONLY)    Imaging Review Dg Abd 2 Views  08/11/2015  CLINICAL DATA:  Chronic constipation, abdominal pain, nausea and vomiting EXAM: ABDOMEN - 2 VIEW COMPARISON:  05/11/2008 FINDINGS: There is normal small bowel  gas pattern. Moderate colonic stool and gas noted in right colon and transverse colon. Some colonic stool and gas noted in descending colon and sigmoid colon. No free abdominal air. IMPRESSION: Normal small bowel gas pattern. Colonic stool and gas as described above. No free abdominal air. Electronically Signed   By: Natasha MeadLiviu  Pop M.D.   On: 08/11/2015 16:18   I have personally reviewed and evaluated these images and lab results as part of my medical decision-making.   EKG Interpretation None      MDM   Final diagnoses:  Constipation  Abdominal pain, unspecified abdominal location  UTI (lower urinary tract infection)    Patient with constipation and abdominal pain. History of chronic constipation. Rectal exam showed noObstruction. Urinalysis does show UTI. Will discharge with antibiotics     Benjiman CoreNathan Eben Choinski, MD 08/11/15 [phone]

## 2015-08-13 ENCOUNTER — Emergency Department (HOSPITAL_COMMUNITY)
Admission: EM | Admit: 2015-08-13 | Discharge: 2015-08-13 | Disposition: A | Discharge status: Home / Self Care | Payer: Medicaid - Out of State | Attending: Physician Assistant | Admitting: Physician Assistant

## 2015-08-13 ENCOUNTER — Encounter (HOSPITAL_COMMUNITY): Payer: Self-pay | Admitting: *Deleted

## 2015-08-13 DIAGNOSIS — F17210 Nicotine dependence, cigarettes, uncomplicated: Secondary | ICD-10-CM | POA: Insufficient documentation

## 2015-08-13 DIAGNOSIS — Z792 Long term (current) use of antibiotics: Secondary | ICD-10-CM | POA: Insufficient documentation

## 2015-08-13 DIAGNOSIS — Y998 Other external cause status: Secondary | ICD-10-CM | POA: Insufficient documentation

## 2015-08-13 DIAGNOSIS — Z23 Encounter for immunization: Secondary | ICD-10-CM | POA: Insufficient documentation

## 2015-08-13 DIAGNOSIS — Y9289 Other specified places as the place of occurrence of the external cause: Secondary | ICD-10-CM | POA: Insufficient documentation

## 2015-08-13 DIAGNOSIS — S61216A Laceration without foreign body of right little finger without damage to nail, initial encounter: Secondary | ICD-10-CM | POA: Insufficient documentation

## 2015-08-13 DIAGNOSIS — Y9389 Activity, other specified: Secondary | ICD-10-CM | POA: Insufficient documentation

## 2015-08-13 DIAGNOSIS — J45909 Unspecified asthma, uncomplicated: Secondary | ICD-10-CM | POA: Insufficient documentation

## 2015-08-13 DIAGNOSIS — S61219A Laceration without foreign body of unspecified finger without damage to nail, initial encounter: Secondary | ICD-10-CM

## 2015-08-13 DIAGNOSIS — W268XXA Contact with other sharp object(s), not elsewhere classified, initial encounter: Secondary | ICD-10-CM | POA: Insufficient documentation

## 2015-08-13 DIAGNOSIS — Z79899 Other long term (current) drug therapy: Secondary | ICD-10-CM | POA: Insufficient documentation

## 2015-08-13 MED ORDER — LIDOCAINE HCL 2 % IJ SOLN
20.0000 mL | Freq: Once | INTRAMUSCULAR | Status: AC
  Administered 2015-08-13: 400 mg via INTRADERMAL
  Filled 2015-08-13: qty 20

## 2015-08-13 MED ORDER — TETANUS-DIPHTH-ACELL PERTUSSIS 5-2.5-18.5 LF-MCG/0.5 IM SUSP
0.5000 mL | Freq: Once | INTRAMUSCULAR | Status: AC
  Administered 2015-08-13: 0.5 mL via INTRAMUSCULAR
  Filled 2015-08-13: qty 0.5

## 2015-08-13 NOTE — ED Provider Notes (Signed)
CSN: [id number]     Arrival date & time 08/13/15  1728 History  By signing my name below, I, Marisue HumbleMichelle Chaffee, attest that this documentation has been prepared under the direction and in the presence of non-physician practitioner, Rhea BleacherJosh Silverio Hagan PA-C. Electronically Signed: Marisue HumbleMichelle Chaffee, Scribe. 08/13/2015. 6:17 PM.     Chief Complaint  Patient presents with  . Finger Injury   The history is provided by the patient. No language interpreter was used.   HPI Comments:  Audrey Pearson is a 25 y.o. female with PMHx of asthma who presents to the Emergency Department complaining of laceration to right short finger ~2 hours ago. Pt states she was using a vegetable slicer when she cut her finger. She reports the wound is not very painful currently. No treatments attempted PTA and bleeding controlled PTA. Pt has no other symptoms or complaints at this time.  Past Medical History  Diagnosis Date  . Asthma    History reviewed. No pertinent past surgical history. History reviewed. No pertinent family history. Social History  Substance Use Topics  . Smoking status: Current Every Day Smoker    Types: Cigarettes  . Smokeless tobacco: None  . Alcohol Use: Yes     Comment: occ   OB History    No data available     Review of Systems  Constitutional: Negative for activity change.  Musculoskeletal: Positive for arthralgias. Negative for back pain, joint swelling and neck pain.  Skin: Positive for wound.  Neurological: Negative for weakness and numbness.    Allergies  Review of patient's allergies indicates no known allergies.  Home Medications   Prior to Admission medications   Medication Sig Start Date End Date Taking? Authorizing Provider  albuterol (PROVENTIL HFA;VENTOLIN HFA) 108 (90 Base) MCG/ACT inhaler Inhale 2 puffs into the lungs every 6 (six) hours as needed for wheezing or shortness of breath.    Historical Provider, MD  docusate sodium (COLACE) 100 MG capsule Take 200 mg by mouth  daily as needed for mild constipation or moderate constipation.    Historical Provider, MD  ondansetron (ZOFRAN-ODT) 8 MG disintegrating tablet Take 1 tablet (8 mg total) by mouth every 8 (eight) hours as needed for nausea or vomiting. 08/11/15   Benjiman CoreNathan Pickering, MD  polyethylene glycol (MIRALAX / GLYCOLAX) packet Take 17 g by mouth daily as needed for mild constipation or moderate constipation.    Historical Provider, MD  Probiotic Product (PROBIOTIC DAILY PO) Take 1 capsule by mouth daily.    Historical Provider, MD  sulfamethoxazole-trimethoprim (BACTRIM DS,SEPTRA DS) 800-160 MG tablet Take 1 tablet by mouth 2 (two) times daily. 08/11/15   Benjiman CoreNathan Pickering, MD   BP 108/60 mmHg  Pulse 68  Temp(Src) 98.6 F (37 C) (Oral)  Resp 18  Ht 5\' 7"  (1.702 m)  Wt 125 lb 8 oz (56.926 kg)  BMI 19.65 kg/m2  SpO2 98%  LMP 08/09/2015 Physical Exam  Constitutional: She appears well-developed and well-nourished.  HENT:  Head: Normocephalic and atraumatic.  Eyes: Pupils are equal, round, and reactive to light.  Neck: Normal range of motion. Neck supple.  Cardiovascular: Exam reveals no decreased pulses.   Musculoskeletal: She exhibits tenderness. She exhibits no edema.  2cm laceration noted to the ulnar aspect of the right short finger. Base of wound explored, no foreign body seen or palpated.  Neurological: She is alert. No sensory deficit.  Motor, sensation, and vascular distal to the injury is fully intact.   Skin: Skin is warm and dry.  Psychiatric: She has a normal mood and affect.  Nursing note and vitals reviewed.   ED Course  Procedures  DIAGNOSTIC STUDIES:  Oxygen Saturation is 98% on RA, normal by my interpretation.    COORDINATION OF CARE:  6:01 PM Will clean wound, numb finger, and repair laceration with stitches. Discussed treatment plan with pt at bedside and pt agreed to plan.  Digital block performed Technique: single injection into base of finger into flexor tendon  sheath Finger: R short Area prepped with alcohol wipe and iodine Medication: 2.10mL of 2% lidocaine without epinephrine  Patient tolerated procedure well. Adequate anesthesia achieved.   6:11 PM LACERATION REPAIR Performed by: Rhea Bleacher PA-C Consent: Verbal consent obtained. Risks and benefits: risks, benefits and alternatives were discussed Patient identity confirmed: provided demographic data Time out performed prior to procedure Prepped and Draped in normal sterile fashion Wound explored Laceration Location: R 5th digit Laceration Length: 2cm No Foreign Bodies seen or palpated Anesthesia: digital block (see above) Irrigation method: dermal cleanser Amount of cleaning: standard Skin closure: 6-0 Ethilon Number of sutures or staples: 6 Technique: simple interrupted Patient tolerance: Patient tolerated the procedure well with no immediate complications.   6:48 PM Patient counseled on wound care. Patient counseled on need to return or see PCP/urgent care for suture removal in 7 days. Patient was urged to return to the Emergency Department urgently with worsening pain, swelling, expanding erythema especially if it streaks away from the affected area, fever, or if they have any other concerns. Patient verbalized understanding.     MDM   Final diagnoses:  Finger laceration, initial encounter   Patient with finger laceration. Full range of motion and I do not suspect tendon involvement. Base of wound fully explored in a bloodless field, no foreign body seen. CMS intact distally. Tetanus updated.   I personally performed the services described in this documentation, which was scribed in my presence. The recorded information has been reviewed and is accurate.     Renne Crigler, PA-C 08/13/15 1849  Courteney Randall An, MD 08/13/15 1900

## 2015-08-13 NOTE — Discharge Instructions (Signed)
Please read and follow all provided instructions.  Your diagnoses today include:  1. Finger laceration, initial encounter    Tests performed today include:  Vital signs. See below for your results today.   Medications prescribed:   None  Take any prescribed medications only as directed.   Home care instructions:  Follow any educational materials and wound care instructions contained in this packet.   Keep affected area above the level of your heart when possible to minimize swelling. Wash area gently twice a day with warm soapy water. Do not apply alcohol or hydrogen peroxide. Cover the area if it draining or weeping.   Follow-up instructions: Suture Removal: Return to the Emergency Department or see your primary care care doctor in 7 days for a recheck of your wound and removal of your sutures or staples.    Return instructions:  Return to the Emergency Department if you have:  Fever  Worsening pain  Worsening swelling of the wound  Pus draining from the wound  Redness of the skin that moves away from the wound, especially if it streaks away from the affected area   Any other emergent concerns  Your vital signs today were: BP 108/60 mmHg   Pulse 68   Temp(Src) 98.6 F (37 C) (Oral)   Resp 18   Ht 5\' 7"  (1.702 m)   Wt 56.926 kg   BMI 19.65 kg/m2   SpO2 98%   LMP 08/09/2015 If your blood pressure (BP) was elevated above 135/85 this visit, please have this repeated by your doctor within one month. --------------

## 2015-08-13 NOTE — ED Notes (Signed)
Declined W/C at D/C and was escorted to lobby by RN. 

## 2015-08-13 NOTE — ED Notes (Signed)
PT reports she was using a vegetable slicer and cut her Rt small finger

## 2015-08-18 ENCOUNTER — Emergency Department (HOSPITAL_COMMUNITY)
Admission: EM | Admit: 2015-08-18 | Discharge: 2015-08-18 | Disposition: A | Discharge status: Home / Self Care | Payer: Self-pay | Attending: Emergency Medicine | Admitting: Emergency Medicine

## 2015-08-18 ENCOUNTER — Encounter (HOSPITAL_COMMUNITY): Payer: Self-pay | Admitting: *Deleted

## 2015-08-18 DIAGNOSIS — Z87891 Personal history of nicotine dependence: Secondary | ICD-10-CM | POA: Insufficient documentation

## 2015-08-18 DIAGNOSIS — J45909 Unspecified asthma, uncomplicated: Secondary | ICD-10-CM | POA: Insufficient documentation

## 2015-08-18 DIAGNOSIS — Z792 Long term (current) use of antibiotics: Secondary | ICD-10-CM | POA: Insufficient documentation

## 2015-08-18 DIAGNOSIS — N12 Tubulo-interstitial nephritis, not specified as acute or chronic: Secondary | ICD-10-CM

## 2015-08-18 DIAGNOSIS — Z79899 Other long term (current) drug therapy: Secondary | ICD-10-CM | POA: Insufficient documentation

## 2015-08-18 DIAGNOSIS — Z87440 Personal history of urinary (tract) infections: Secondary | ICD-10-CM | POA: Insufficient documentation

## 2015-08-18 DIAGNOSIS — R630 Anorexia: Secondary | ICD-10-CM | POA: Insufficient documentation

## 2015-08-18 LAB — COMPREHENSIVE METABOLIC PANEL
ALT: 12 U/L — AB (ref 14–54)
ANION GAP: 11 (ref 5–15)
AST: 16 U/L (ref 15–41)
Albumin: 3.7 g/dL (ref 3.5–5.0)
Alkaline Phosphatase: 56 U/L (ref 38–126)
BILIRUBIN TOTAL: 0.9 mg/dL (ref 0.3–1.2)
BUN: 5 mg/dL — ABNORMAL LOW (ref 6–20)
CALCIUM: 9.3 mg/dL (ref 8.9–10.3)
CO2: 22 mmol/L (ref 22–32)
CREATININE: 0.68 mg/dL (ref 0.44–1.00)
Chloride: 108 mmol/L (ref 101–111)
Glucose, Bld: 99 mg/dL (ref 65–99)
Potassium: 3.6 mmol/L (ref 3.5–5.1)
SODIUM: 141 mmol/L (ref 135–145)
TOTAL PROTEIN: 7 g/dL (ref 6.5–8.1)

## 2015-08-18 LAB — CBC WITH DIFFERENTIAL/PLATELET
Basophils Absolute: 0 10*3/uL (ref 0.0–0.1)
Basophils Relative: 0 %
EOS ABS: 0 10*3/uL (ref 0.0–0.7)
Eosinophils Relative: 0 %
HCT: 38.4 % (ref 36.0–46.0)
HEMOGLOBIN: 13 g/dL (ref 12.0–15.0)
LYMPHS ABS: 1.3 10*3/uL (ref 0.7–4.0)
LYMPHS PCT: 10 %
MCH: 29.5 pg (ref 26.0–34.0)
MCHC: 33.9 g/dL (ref 30.0–36.0)
MCV: 87.3 fL (ref 78.0–100.0)
MONOS PCT: 8 %
Monocytes Absolute: 1.1 10*3/uL — ABNORMAL HIGH (ref 0.1–1.0)
Neutro Abs: 10.9 10*3/uL — ABNORMAL HIGH (ref 1.7–7.7)
Neutrophils Relative %: 82 %
Platelets: 202 10*3/uL (ref 150–400)
RBC: 4.4 MIL/uL (ref 3.87–5.11)
RDW: 13.9 % (ref 11.5–15.5)
WBC: 13.3 10*3/uL — ABNORMAL HIGH (ref 4.0–10.5)

## 2015-08-18 LAB — URINALYSIS, ROUTINE W REFLEX MICROSCOPIC
BILIRUBIN URINE: NEGATIVE
Glucose, UA: NEGATIVE mg/dL
KETONES UR: 15 mg/dL — AB
Nitrite: POSITIVE — AB
Protein, ur: 30 mg/dL — AB
SPECIFIC GRAVITY, URINE: 1.015 (ref 1.005–1.030)
pH: 5.5 (ref 5.0–8.0)

## 2015-08-18 LAB — I-STAT BETA HCG BLOOD, ED (MC, WL, AP ONLY): I-stat hCG, quantitative: <5 m[IU]/mL (ref <5)

## 2015-08-18 LAB — URINE MICROSCOPIC-ADD ON

## 2015-08-18 MED ORDER — CEPHALEXIN 500 MG PO CAPS
500.0000 mg | ORAL_CAPSULE | Freq: Four times a day (QID) | ORAL | Status: DC
Start: 2015-08-18 — End: 2016-01-12

## 2015-08-18 MED ORDER — NAPROXEN 250 MG PO TABS: 250.0000 mg | ORAL_TABLET | Freq: Two times a day (BID) | ORAL | Status: DC

## 2015-08-18 MED ORDER — ONDANSETRON HCL 4 MG/2ML IJ SOLN
4.0000 mg | Freq: Once | INTRAMUSCULAR | Status: AC
  Administered 2015-08-18: 4 mg via INTRAVENOUS
  Filled 2015-08-18: qty 2

## 2015-08-18 MED ORDER — ONDANSETRON 4 MG PO TBDP: 4.0000 mg | ORAL_TABLET | Freq: Three times a day (TID) | ORAL | Status: DC | PRN

## 2015-08-18 MED ORDER — SODIUM CHLORIDE 0.9 % IV BOLUS (SEPSIS)
1000.0000 mL | Freq: Once | INTRAVENOUS | Status: AC
  Administered 2015-08-18: 1000 mL via INTRAVENOUS

## 2015-08-18 MED ORDER — CEFTRIAXONE SODIUM 1 G IJ SOLR
1.0000 g | Freq: Once | INTRAMUSCULAR | Status: AC
  Administered 2015-08-18: 1 g via INTRAVENOUS
  Filled 2015-08-18: qty 10

## 2015-08-18 MED ORDER — ACETAMINOPHEN 325 MG PO TABS
650.0000 mg | ORAL_TABLET | Freq: Once | ORAL | Status: AC
  Administered 2015-08-18: 650 mg via ORAL
  Filled 2015-08-18: qty 2

## 2015-08-18 MED ORDER — MORPHINE SULFATE (PF) 4 MG/ML IV SOLN
4.0000 mg | Freq: Once | INTRAVENOUS | Status: AC
  Administered 2015-08-18: 4 mg via INTRAVENOUS
  Filled 2015-08-18: qty 1

## 2015-08-18 NOTE — ED Notes (Signed)
Pt ambulates independently and with steady gait at time of discharge. Discharge instructions and follow up information reviewed with patient. No other questions or concerns voiced at this time. RX x 3. 

## 2015-08-18 NOTE — Discharge Instructions (Signed)

## 2015-08-18 NOTE — ED Notes (Signed)
Pt reports R flank pain, pt reports having dx of UTI x 1 wk ago, pt reports fever, pt A&O x4

## 2015-08-18 NOTE — ED Notes (Signed)
Pa at the bedside

## 2015-08-18 NOTE — ED Provider Notes (Signed)
CSN: [id number]     Arrival date & time 08/18/15  0816 History   First MD Initiated Contact with Patient 08/18/15 [phone]     Chief Complaint  Patient presents with  . Fever    Audrey Pearson is a 25 y.o. female who presents the emergency department complaining of right sided abdominal pain and fever since yesterday. The patient reports she was seen in the emergency department diagnosed with a urinary tract infection approximately 1 week ago. Patient completed a three-day course of Bactrim with relief. Patient reports that yesterday she began having right flank pain with fever and chills. She also reports some intermittent nausea. No vomiting or diarrhea. She reports decreased appetite. She denies any current urinary symptoms. She denies vaginal bleeding or vaginal discharge. No previous abdominal surgeries. Nothing for treatment prior to arrival today. The patient denies vomiting, diarrhea, hematemesis, hematochezia, dysuria, hematuria, urinary frequency, urinary urgency, vaginal bleeding, vaginal discharge or rashes.  The history is provided by the patient. No language interpreter was used.    Past Medical History  Diagnosis Date  . Asthma    History reviewed. No pertinent past surgical history. No family history on file. Social History  Substance Use Topics  . Smoking status: Former Smoker    Types: Cigarettes    Quit date: 05/15/2015  . Smokeless tobacco: None  . Alcohol Use: No     Comment: occ   OB History    No data available     Review of Systems  Constitutional: Positive for fever, chills and appetite change.  HENT: Negative for congestion and sore throat.   Eyes: Negative for visual disturbance.  Respiratory: Negative for cough, shortness of breath and wheezing.   Cardiovascular: Negative for chest pain and palpitations.  Gastrointestinal: Positive for nausea and abdominal pain. Negative for vomiting, diarrhea and blood in stool.  Genitourinary: Positive for flank pain.  Negative for dysuria, urgency, frequency, hematuria, vaginal bleeding, vaginal discharge, difficulty urinating and pelvic pain.  Musculoskeletal: Negative for back pain and neck pain.  Skin: Negative for rash.  Neurological: Negative for syncope, light-headedness and headaches.      Allergies  Review of patient's allergies indicates no known allergies.  Home Medications   Prior to Admission medications   Medication Sig Start Date End Date Taking? Authorizing Provider  CHARCOAL ACTIVATED PO Take 2 tablets by mouth 2 (two) times daily.   Yes Historical Provider, MD  naproxen sodium (ANAPROX) 220 MG tablet Take 440 mg by mouth daily as needed (pain (pinky)).   Yes Historical Provider, MD  albuterol (PROVENTIL HFA;VENTOLIN HFA) 108 (90 Base) MCG/ACT inhaler Inhale 2 puffs into the lungs every 6 (six) hours as needed for wheezing or shortness of breath.    Historical Provider, MD  cephALEXin (KEFLEX) 500 MG capsule Take 1 capsule (500 mg total) by mouth 4 (four) times daily. 08/18/15   Everlene FarrierWilliam Rylah Fukuda, PA-C  docusate sodium (COLACE) 100 MG capsule Take 200 mg by mouth daily as needed for mild constipation or moderate constipation.    Historical Provider, MD  naproxen (NAPROSYN) 250 MG tablet Take 1 tablet (250 mg total) by mouth 2 (two) times daily with a meal. 08/18/15   Everlene FarrierWilliam Jarick Harkins, PA-C  ondansetron (ZOFRAN ODT) 4 MG disintegrating tablet Take 1 tablet (4 mg total) by mouth every 8 (eight) hours as needed for nausea or vomiting. 08/18/15   Everlene FarrierWilliam Maryella Abood, PA-C  polyethylene glycol (MIRALAX / GLYCOLAX) packet Take 17 g by mouth daily as needed for mild  constipation or moderate constipation.    Historical Provider, MD  Probiotic Product (PROBIOTIC DAILY PO) Take 1 capsule by mouth daily.    Historical Provider, MD   BP 103/71 mmHg  Pulse 89  Temp(Src) 99.4 F (37.4 C) (Oral)  Resp 18  Ht  (1.702 m)  Wt 56.501 kg  BMI 19.50 kg/m2  SpO2 100%  LMP 08/09/2015 Physical Exam   Constitutional: She appears well-developed and well-nourished. No distress.  Nontoxic appearing.  HENT:  Head: Normocephalic and atraumatic.  Mouth/Throat: Oropharynx is clear and moist.  Eyes: Conjunctivae are normal. Pupils are equal, round, and reactive to light. Right eye exhibits no discharge. Left eye exhibits no discharge.  Neck: Neck supple.  Cardiovascular: Normal rate, regular rhythm, normal heart sounds and intact distal pulses.  Exam reveals no gallop and no friction rub.   No murmur heard. Heart rate is 92.  Pulmonary/Chest: Effort normal and breath sounds normal. No respiratory distress. She has no wheezes. She has no rales.  Lungs are clear to auscultation bilaterally.  Abdominal: Soft. Bowel sounds are normal. She exhibits no distension and no mass. There is tenderness. There is no rebound and no guarding.  Patient has right CVA tenderness. No RLQ tenderness. Abdomen is soft. Bowel sounds are present. No psoas or obturator sign.   Musculoskeletal: She exhibits no edema.  Lymphadenopathy:    She has no cervical adenopathy.  Neurological: She is alert. Coordination normal.  Skin: Skin is warm and dry. No rash noted. She is not diaphoretic. No erythema. No pallor.  Psychiatric: She has a normal mood and affect. Her behavior is normal.  Nursing note and vitals reviewed.   ED Course  Procedures (including critical care time) Labs Review Labs Reviewed  URINALYSIS, ROUTINE W REFLEX MICROSCOPIC (NOT AT [facility]) - Abnormal; Notable for the following:    Color, Urine AMBER (*)    APPearance TURBID (*)    Hgb urine dipstick MODERATE (*)    Ketones, ur 15 (*)    Protein, ur 30 (*)    Nitrite POSITIVE (*)    Leukocytes, UA LARGE (*)    All other components within normal limits  URINE MICROSCOPIC-ADD ON - Abnormal; Notable for the following:    Squamous Epithelial / LPF 6-30 (*)    Bacteria, UA MANY (*)    All other components within normal limits  CBC WITH  DIFFERENTIAL/PLATELET - Abnormal; Notable for the following:    WBC 13.3 (*)    Neutro Abs 10.9 (*)    Monocytes Absolute 1.1 (*)    All other components within normal limits  COMPREHENSIVE METABOLIC PANEL - Abnormal; Notable for the following:    BUN 5 (*)    ALT 12 (*)    All other components within normal limits  URINE CULTURE  I-STAT BETA HCG BLOOD, ED ([facility], [facility], [facility] ONLY)    Imaging Review No results found. I have personally reviewed and evaluated these lab results as part of my medical decision-making.   EKG Interpretation None      Filed Vitals:   08/18/15 1030 08/18/15 1141 08/18/15 1200 08/18/15 1245  BP: 113/64  103/62 103/71  Pulse: 98  100 89  Temp:  99.4 F (37.4 C)    TempSrc:  Oral    Resp:      Height:      Weight:      SpO2: 99%  95% 100%     MDM   Meds given in ED:  Medications  sodium chloride 0.9 % bolus 1,000 mL (0 mLs Intravenous Stopped 08/18/15 1141)  ondansetron (ZOFRAN) injection 4 mg (4 mg Intravenous Given 08/18/15 1038)  acetaminophen (TYLENOL) tablet 650 mg (650 mg Oral Given 08/18/15 1034)  cefTRIAXone (ROCEPHIN) 1 g in dextrose 5 % 50 mL IVPB (0 g Intravenous Stopped 08/18/15 1113)  morphine 4 MG/ML injection 4 mg (4 mg Intravenous Given 08/18/15 1316)    New Prescriptions   CEPHALEXIN (KEFLEX) 500 MG CAPSULE    Take 1 capsule (500 mg total) by mouth 4 (four) times daily.   NAPROXEN (NAPROSYN) 250 MG TABLET    Take 1 tablet (250 mg total) by mouth 2 (two) times daily with a meal.   ONDANSETRON (ZOFRAN ODT) 4 MG DISINTEGRATING TABLET    Take 1 tablet (4 mg total) by mouth every 8 (eight) hours as needed for nausea or vomiting.    Final diagnoses:  Pyelonephritis   This is a 25 y.o. female who presents the emergency department complaining of right sided abdominal pain and fever since yesterday. The patient reports she was seen in the emergency department diagnosed with a urinary tract infection approximately 1 week ago. Patient completed a  three-day course of Bactrim with relief. Patient reports that yesterday she began having right flank pain with fever and chills. She also reports some intermittent nausea. No vomiting or diarrhea. She reports decreased appetite. She denies any current urinary symptoms. She denies vaginal bleeding or vaginal discharge.  On arrival to the emergency room the patient has a temperature of 100.3. This improved after Tylenol. On exam she is nontoxic appearing. Her abdomen is soft and she has right CVA tenderness. Patient avoids of constant pain that is not colicky. I have low suspicion for renal stone.  She is a negative pregnancy test. CBC is remarkable for a white count of 13,000. CMP is unremarkable. Preserved kidney function. Urinalysis is nitrite positive with large leukocytes and too numerous to count white blood cells. Urine sent for culture. Patient with right pyelonephritis. Patient is provided with a gram of Rocephin in the emergency department. She is tolerating by mouth in the emergency department she is tolerating water and ginger ale prior to discharge. She reports having better after morphine. Will discharge with Keflex 500 mg 4 times a day for 10 days for pyelonephritis. Will also provide with prescriptions for naproxen and Zofran. I discussed certain specific return precautions. I advised the patient to follow-up with their primary care provider this week. I advised the patient to return to the emergency department with new or worsening symptoms or new concerns. The patient verbalized understanding and agreement with plan.      Everlene Farrier, PA-C 08/18/15 1409  Benjiman Core, MD 08/18/15 [phone]

## 2015-08-20 ENCOUNTER — Encounter (HOSPITAL_COMMUNITY): Payer: Self-pay | Admitting: *Deleted

## 2015-08-20 ENCOUNTER — Ambulatory Visit (HOSPITAL_COMMUNITY)
Admission: EM | Admit: 2015-08-20 | Discharge: 2015-08-20 | Disposition: A | Discharge status: Home / Self Care | Payer: Self-pay | Attending: Family Medicine | Admitting: Family Medicine

## 2015-08-20 DIAGNOSIS — Z4802 Encounter for removal of sutures: Secondary | ICD-10-CM

## 2015-08-20 LAB — URINE CULTURE

## 2015-08-20 NOTE — ED Notes (Signed)
Pt       Reports       She  Is    Here  For  Suture  Removal          Sutures  Have  Been in  For  About  1  Week  Appears  To be  Well  Healing

## 2015-08-20 NOTE — Discharge Instructions (Signed)
RETURN AS NEEDED 

## 2015-08-20 NOTE — ED Provider Notes (Signed)
CSN: [id number]     Arrival date & time 08/20/15  1430 History   First MD Initiated Contact with Patient 08/20/15 1512     Chief Complaint  Patient presents with  . Suture / Staple Removal   (Consider location/radiation/quality/duration/timing/severity/associated sxs/prior Treatment) Patient is a 25 y.o. female presenting with suture removal. The history is provided by the patient and a friend.  Suture / Staple Removal This is a new problem. The current episode started more than 2 days ago (lac to r5th finger, 1wk ago , here for sr.). The problem has been rapidly improving. Associated symptoms comments: No problems..    Past Medical History  Diagnosis Date  . Asthma    History reviewed. No pertinent past surgical history. History reviewed. No pertinent family history. Social History  Substance Use Topics  . Smoking status: Former Smoker    Types: Cigarettes    Quit date: 05/15/2015  . Smokeless tobacco: None  . Alcohol Use: No     Comment: occ   OB History    No data available     Review of Systems  Constitutional: Negative.   Skin: Positive for wound.  All other systems reviewed and are negative.   Allergies  Review of patient's allergies indicates no known allergies.  Home Medications   Prior to Admission medications   Medication Sig Start Date End Date Taking? Authorizing Provider  albuterol (PROVENTIL HFA;VENTOLIN HFA) 108 (90 Base) MCG/ACT inhaler Inhale 2 puffs into the lungs every 6 (six) hours as needed for wheezing or shortness of breath.    Historical Provider, MD  cephALEXin (KEFLEX) 500 MG capsule Take 1 capsule (500 mg total) by mouth 4 (four) times daily. 08/18/15   Everlene Farrier, PA-C  CHARCOAL ACTIVATED PO Take 2 tablets by mouth 2 (two) times daily.    Historical Provider, MD  docusate sodium (COLACE) 100 MG capsule Take 200 mg by mouth daily as needed for mild constipation or moderate constipation.    Historical Provider, MD  naproxen (NAPROSYN) 250 MG  tablet Take 1 tablet (250 mg total) by mouth 2 (two) times daily with a meal. 08/18/15   Everlene Farrier, PA-C  naproxen sodium (ANAPROX) 220 MG tablet Take 440 mg by mouth daily as needed (pain (pinky)).    Historical Provider, MD  ondansetron (ZOFRAN ODT) 4 MG disintegrating tablet Take 1 tablet (4 mg total) by mouth every 8 (eight) hours as needed for nausea or vomiting. 08/18/15   Everlene Farrier, PA-C  polyethylene glycol (MIRALAX / GLYCOLAX) packet Take 17 g by mouth daily as needed for mild constipation or moderate constipation.    Historical Provider, MD  Probiotic Product (PROBIOTIC DAILY PO) Take 1 capsule by mouth daily.    Historical Provider, MD   Meds Ordered and Administered this Visit  Medications - No data to display  BP 112/73 mmHg  Pulse 82  Temp(Src) 98.7 F (37.1 C) (Oral)  Resp 16  SpO2 100%  LMP 08/09/2015 No data found.   Physical Exam  Constitutional: She is oriented to person, place, and time. She appears well-developed and well-nourished. No distress.  Musculoskeletal: Normal range of motion. She exhibits no tenderness.  Neurological: She is alert and oriented to person, place, and time.  Skin: Skin is warm and dry.  6 stitches removed, without diff, nvt intact, no infection.  Nursing note and vitals reviewed.   ED Course  Procedures (including critical care time)  Labs Review Labs Reviewed - No data to display  Imaging  Review No results found.   Visual Acuity Review  Right Eye Distance:   Left Eye Distance:   Bilateral Distance:    Right Eye Near:   Left Eye Near:    Bilateral Near:         MDM   1. Visit for suture removal    6 STITCHES REMOVED.    Linna HoffJames D Kindl, MD 08/20/15 [phone]

## 2015-08-21 ENCOUNTER — Telehealth: Payer: Self-pay

## 2015-08-21 NOTE — Telephone Encounter (Signed)
Post ED Visit - Positive Culture Follow-up  Culture report reviewed by antimicrobial stewardship pharmacist:  []  Enzo BiNathan Batchelder, Pharm.D. []  Celedonio MiyamotoJeremy Frens, Pharm.D., BCPS []  Garvin FilaMike Maccia, Pharm.D. []  Georgina PillionElizabeth Martin, Pharm.D., BCPS []  BredaMinh Pham, [address]Ph[address]D., BCPS, AAHIVP []  Estella HuskMichelle Turner, Pharm.D., BCPS, AAHIVP []  Tennis Mustassie Stewart, Pharm.D. []  Sherle Poeob Vincent, [address]Ph[address] Leone HavenAubrey Jones Pharm D Positive urine culture Treated with cephalexin, organism sensitive to the same and no further patient follow-up is required at this time.  Jerry CarasCullom, Chaunte Hornbeck Burnett 08/21/2015, 1:56 PM

## 2015-09-14 ENCOUNTER — Encounter (HOSPITAL_COMMUNITY): Payer: Self-pay | Admitting: Emergency Medicine

## 2015-09-14 ENCOUNTER — Emergency Department (HOSPITAL_COMMUNITY)
Admission: EM | Admit: 2015-09-14 | Discharge: 2015-09-15 | Disposition: A | Discharge status: Home / Self Care | Payer: Self-pay | Attending: Emergency Medicine | Admitting: Emergency Medicine

## 2015-09-14 DIAGNOSIS — J45901 Unspecified asthma with (acute) exacerbation: Secondary | ICD-10-CM | POA: Insufficient documentation

## 2015-09-14 DIAGNOSIS — Z3202 Encounter for pregnancy test, result negative: Secondary | ICD-10-CM | POA: Insufficient documentation

## 2015-09-14 DIAGNOSIS — R1012 Left upper quadrant pain: Secondary | ICD-10-CM | POA: Insufficient documentation

## 2015-09-14 DIAGNOSIS — R1110 Vomiting, unspecified: Secondary | ICD-10-CM | POA: Insufficient documentation

## 2015-09-14 DIAGNOSIS — Z87891 Personal history of nicotine dependence: Secondary | ICD-10-CM | POA: Insufficient documentation

## 2015-09-14 LAB — CBC
HCT: 39.6 % (ref 36.0–46.0)
Hemoglobin: 13.5 g/dL (ref 12.0–15.0)
MCH: 29.7 pg (ref 26.0–34.0)
MCHC: 34.1 g/dL (ref 30.0–36.0)
MCV: 87.2 fL (ref 78.0–100.0)
PLATELETS: 241 10*3/uL (ref 150–400)
RBC: 4.54 MIL/uL (ref 3.87–5.11)
RDW: 13.8 % (ref 11.5–15.5)
WBC: 8.9 10*3/uL (ref 4.0–10.5)

## 2015-09-14 LAB — URINALYSIS, ROUTINE W REFLEX MICROSCOPIC
BILIRUBIN URINE: NEGATIVE
GLUCOSE, UA: NEGATIVE mg/dL
HGB URINE DIPSTICK: NEGATIVE
KETONES UR: NEGATIVE mg/dL
Leukocytes, UA: NEGATIVE
Nitrite: NEGATIVE
Protein, ur: NEGATIVE mg/dL
Specific Gravity, Urine: 1.017 (ref 1.005–1.030)
pH: 6.5 (ref 5.0–8.0)

## 2015-09-14 LAB — COMPREHENSIVE METABOLIC PANEL
ALBUMIN: 4.1 g/dL (ref 3.5–5.0)
ALK PHOS: 58 U/L (ref 38–126)
ALT: 17 U/L (ref 14–54)
ANION GAP: 9 (ref 5–15)
AST: 21 U/L (ref 15–41)
BUN: 8 mg/dL (ref 6–20)
CHLORIDE: 105 mmol/L (ref 101–111)
CO2: 26 mmol/L (ref 22–32)
CREATININE: 0.66 mg/dL (ref 0.44–1.00)
Calcium: 9.4 mg/dL (ref 8.9–10.3)
GFR calc Af Amer: >60 mL/min (ref >60)
GFR calc non Af Amer: >60 mL/min (ref >60)
GLUCOSE: 88 mg/dL (ref 65–99)
Potassium: 3.8 mmol/L (ref 3.5–5.1)
SODIUM: 140 mmol/L (ref 135–145)
Total Bilirubin: 0.4 mg/dL (ref 0.3–1.2)
Total Protein: 7.2 g/dL (ref 6.5–8.1)

## 2015-09-14 LAB — LIPASE, BLOOD: Lipase: 44 U/L (ref 11–51)

## 2015-09-14 MED ORDER — ALBUTEROL SULFATE (2.5 MG/3ML) 0.083% IN NEBU
5.0000 mg | INHALATION_SOLUTION | Freq: Once | RESPIRATORY_TRACT | Status: AC
  Administered 2015-09-14: 5 mg via RESPIRATORY_TRACT

## 2015-09-14 MED ORDER — ALBUTEROL SULFATE (2.5 MG/3ML) 0.083% IN NEBU
INHALATION_SOLUTION | RESPIRATORY_TRACT | Status: AC
  Filled 2015-09-14: qty 3

## 2015-09-14 NOTE — ED Notes (Signed)
Pt c/o stomach pains, vomiting, and asthma attack, pt has bilaterally wheezing. Pt tender to palpation on abdomen. Pt instructed to take deep breaths and is calming down. Pain 7/10.

## 2015-09-14 NOTE — ED Provider Notes (Signed)
CSN: [id number]     Arrival date & time 09/14/15  2139 History  By signing my name below, I, Phillis Haggis, attest that this documentation has been prepared under the direction and in the presence of Gilda Crease, MD. Electronically Signed: Phillis Haggis, ED Scribe. 09/14/2015. 12:15 AM.  Chief Complaint  Patient presents with  . Abdominal Pain  . Asthma   The history is provided by the patient. No language interpreter was used.  HPI Comments: Audrey Pearson is a 25 y.o. Female with a hx of asthma who presents to the Emergency Department complaining of cramping, upper abdominal pain that radiates to the left middle back onset earlier this evening. Pt reports associated intermittent vomiting that began when she was at church. She reports that she had an asthma attack at church with wheezing that has since resolved. She did not have her inhaler on hand during the asthma attack. Pt states that she had "kidney problems" last month with similar symptoms. She has not taken anything for her symptoms. She denies diarrhea, dysuria, hematuria, frequency, or urgency. Pt states that she missed her period last month and does not know if her pills from her kidney infection last month may have caused this. She is not currently on her menstrual period. 08/09/15 was the last day of her last menstrual period.  Past Medical History  Diagnosis Date  . Asthma    History reviewed. No pertinent past surgical history. No family history on file. Social History  Substance Use Topics  . Smoking status: Former Smoker    Types: Cigarettes    Quit date: 05/15/2015  . Smokeless tobacco: None  . Alcohol Use: No     Comment: occ   OB History    No data available     Review of Systems  Respiratory: Positive for wheezing.   Gastrointestinal: Positive for vomiting and abdominal pain.  All other systems reviewed and are negative.  Allergies  Review of patient's allergies indicates no known allergies.  Home  Medications   Prior to Admission medications   Medication Sig Start Date End Date Taking? Authorizing Provider  cephALEXin (KEFLEX) 500 MG capsule Take 1 capsule (500 mg total) by mouth 4 (four) times daily. Patient not taking: Reported on 09/15/2015 08/18/15   Everlene Farrier, PA-C  dicyclomine (BENTYL) 20 MG tablet Take 1 tablet (20 mg total) by mouth 2 (two) times daily. 09/15/15   Gilda Crease, MD  famotidine (PEPCID) 20 MG tablet Take 1 tablet (20 mg total) by mouth 2 (two) times daily. 09/15/15   Gilda Crease, MD  naproxen (NAPROSYN) 250 MG tablet Take 1 tablet (250 mg total) by mouth 2 (two) times daily with a meal. Patient not taking: Reported on 09/15/2015 08/18/15   Everlene Farrier, PA-C  ondansetron (ZOFRAN ODT) 4 MG disintegrating tablet Take 1 tablet (4 mg total) by mouth every 8 (eight) hours as needed for nausea or vomiting. Patient not taking: Reported on 09/15/2015 08/18/15   Everlene Farrier, PA-C  traMADol (ULTRAM) 50 MG tablet Take 1 tablet (50 mg total) by mouth every 6 (six) hours as needed. 09/15/15   Gilda Crease, MD   BP 103/65 mmHg  Pulse 79  Temp(Src) 98.3 F (36.8 C) (Oral)  Resp 16  Ht  (1.702 m)  Wt 125 lb (56.7 kg)  BMI 19.57 kg/m2  SpO2 100% Physical Exam  Constitutional: She is oriented to person, place, and time. She appears well-developed and well-nourished. No distress.  HENT:  Head: Normocephalic and atraumatic.  Right Ear: Hearing normal.  Left Ear: Hearing normal.  Nose: Nose normal.  Mouth/Throat: Oropharynx is clear and moist and mucous membranes are normal.  Eyes: Conjunctivae and EOM are normal. Pupils are equal, round, and reactive to light.  Neck: Normal range of motion. Neck supple.  Cardiovascular: Regular rhythm, S1 normal and S2 normal.  Exam reveals no gallop and no friction rub.   No murmur heard. Pulmonary/Chest: Effort normal and breath sounds normal. No respiratory distress. She has no wheezes. She exhibits no  tenderness.  Abdominal: Soft. Normal appearance and bowel sounds are normal. There is no hepatosplenomegaly. There is no tenderness. There is no rebound, no guarding, no tenderness at McBurney's point and negative Murphy's sign. No hernia.  Musculoskeletal: Normal range of motion.  Neurological: She is alert and oriented to person, place, and time. She has normal strength. No cranial nerve deficit or sensory deficit. Coordination normal. GCS eye subscore is 4. GCS verbal subscore is 5. GCS motor subscore is 6.  Skin: Skin is warm, dry and intact. No rash noted. No cyanosis.  Psychiatric: She has a normal mood and affect. Her speech is normal and behavior is normal. Thought content normal.  Nursing note and vitals reviewed.   ED Course  Procedures (including critical care time) DIAGNOSTIC STUDIES: Oxygen Saturation is 100% on RA, normal by my interpretation.    COORDINATION OF CARE: 12:13 AM-Discussed treatment plan which includes labs with pt at bedside and pt agreed to plan.   Labs Review Labs Reviewed  LIPASE, BLOOD  COMPREHENSIVE METABOLIC PANEL  CBC  URINALYSIS, ROUTINE W REFLEX MICROSCOPIC (NOT AT [facility])  PREGNANCY, URINE    Imaging Review No results found. I have personally reviewed and evaluated these images and lab results as part of my medical decision-making.   EKG Interpretation None      MDM   Final diagnoses:  Left upper quadrant pain   Patient presents to the emergency department for evaluation of abdominal pain. Patient reported diffuse abdominal pain, however, currently she is having left upper abdominal pain. Patient reports that she has had a lot of "stomach problems". Mother reports that she has had frequent episodes of abdominal pain, mostly left-sided. No family history of inflammatory bowel disease. Patient was treated for urinary tract infection last month. Urinalysis today, however, is unremarkable. Blood work is unremarkable. Abdominal examination is  benign today. Patient does not require any imaging at this time, but will recommend gastroenterology follow-up.  Patient also reports that she has not had a menstrual periods since the end of March. Urine pregnancy is negative today. She is normally fairly regular. Not expressing any pelvic pain, bleeding, discharge. Will recommend follow-up with OB/GYN.  I personally performed the services described in this documentation, which was scribed in my presence. The recorded information has been reviewed and is accurate.    Gilda Creasehristopher J Pollina, MD 09/15/15 [phone]

## 2015-09-15 LAB — PREGNANCY, URINE: PREG TEST UR: NEGATIVE

## 2015-09-15 MED ORDER — FAMOTIDINE 20 MG PO TABS: 20.0000 mg | ORAL_TABLET | Freq: Two times a day (BID) | ORAL | Status: DC

## 2015-09-15 MED ORDER — TRAMADOL HCL 50 MG PO TABS: 50.0000 mg | ORAL_TABLET | Freq: Four times a day (QID) | ORAL | Status: DC | PRN

## 2015-09-15 MED ORDER — DICYCLOMINE HCL 20 MG PO TABS: 20.0000 mg | ORAL_TABLET | Freq: Two times a day (BID) | ORAL | Status: DC

## 2015-09-15 MED ORDER — OXYCODONE-ACETAMINOPHEN 5-325 MG PO TABS
1.0000 | ORAL_TABLET | Freq: Once | ORAL | Status: AC
  Administered 2015-09-15: 1 via ORAL
  Filled 2015-09-15: qty 1

## 2015-09-15 NOTE — Discharge Instructions (Signed)

## 2015-09-30 ENCOUNTER — Other Ambulatory Visit: Payer: Self-pay | Admitting: Obstetrics & Gynecology

## 2015-09-30 DIAGNOSIS — N6452 Nipple discharge: Secondary | ICD-10-CM

## 2015-10-04 ENCOUNTER — Ambulatory Visit
Admission: RE | Admit: 2015-10-04 | Discharge: 2015-10-04 | Disposition: A | Discharge status: Home / Self Care | Payer: No Typology Code available for payment source | Source: Ambulatory Visit | Attending: Obstetrics & Gynecology | Admitting: Obstetrics & Gynecology

## 2015-10-04 DIAGNOSIS — N6452 Nipple discharge: Secondary | ICD-10-CM

## 2015-11-16 ENCOUNTER — Encounter (HOSPITAL_COMMUNITY): Payer: Self-pay

## 2015-11-16 ENCOUNTER — Emergency Department (HOSPITAL_COMMUNITY): Payer: Self-pay

## 2015-11-16 ENCOUNTER — Emergency Department (HOSPITAL_COMMUNITY)
Admission: EM | Admit: 2015-11-16 | Discharge: 2015-11-16 | Disposition: A | Discharge status: Home / Self Care | Payer: Self-pay | Attending: Emergency Medicine | Admitting: Emergency Medicine

## 2015-11-16 DIAGNOSIS — Z3202 Encounter for pregnancy test, result negative: Secondary | ICD-10-CM | POA: Insufficient documentation

## 2015-11-16 DIAGNOSIS — Z79899 Other long term (current) drug therapy: Secondary | ICD-10-CM | POA: Insufficient documentation

## 2015-11-16 DIAGNOSIS — R1032 Left lower quadrant pain: Secondary | ICD-10-CM

## 2015-11-16 DIAGNOSIS — J45909 Unspecified asthma, uncomplicated: Secondary | ICD-10-CM | POA: Insufficient documentation

## 2015-11-16 DIAGNOSIS — K5900 Constipation, unspecified: Secondary | ICD-10-CM | POA: Insufficient documentation

## 2015-11-16 DIAGNOSIS — F17210 Nicotine dependence, cigarettes, uncomplicated: Secondary | ICD-10-CM | POA: Insufficient documentation

## 2015-11-16 HISTORY — DX: Irritable bowel syndrome, unspecified: K58.9

## 2015-11-16 LAB — CBC
HCT: 40.6 % (ref 36.0–46.0)
Hemoglobin: 13.9 g/dL (ref 12.0–15.0)
MCH: 30.4 pg (ref 26.0–34.0)
MCHC: 34.2 g/dL (ref 30.0–36.0)
MCV: 88.8 fL (ref 78.0–100.0)
PLATELETS: 271 10*3/uL (ref 150–400)
RBC: 4.57 MIL/uL (ref 3.87–5.11)
RDW: 13.1 % (ref 11.5–15.5)
WBC: 9.1 10*3/uL (ref 4.0–10.5)

## 2015-11-16 LAB — URINE MICROSCOPIC-ADD ON

## 2015-11-16 LAB — URINALYSIS, ROUTINE W REFLEX MICROSCOPIC
Bilirubin Urine: NEGATIVE
GLUCOSE, UA: NEGATIVE mg/dL
Ketones, ur: NEGATIVE mg/dL
Leukocytes, UA: NEGATIVE
Nitrite: NEGATIVE
Protein, ur: NEGATIVE mg/dL
SPECIFIC GRAVITY, URINE: 1.02 (ref 1.005–1.030)
pH: 8.5 — ABNORMAL HIGH (ref 5.0–8.0)

## 2015-11-16 LAB — COMPREHENSIVE METABOLIC PANEL
ALBUMIN: 3.9 g/dL (ref 3.5–5.0)
ALT: 7 U/L — AB (ref 14–54)
AST: 12 U/L — AB (ref 15–41)
Alkaline Phosphatase: 54 U/L (ref 38–126)
Anion gap: 8 (ref 5–15)
BUN: 5 mg/dL — ABNORMAL LOW (ref 6–20)
CHLORIDE: 109 mmol/L (ref 101–111)
CO2: 22 mmol/L (ref 22–32)
CREATININE: 0.59 mg/dL (ref 0.44–1.00)
Calcium: 9.3 mg/dL (ref 8.9–10.3)
GFR calc non Af Amer: >60 mL/min (ref >60)
GLUCOSE: 86 mg/dL (ref 65–99)
Potassium: 3.9 mmol/L (ref 3.5–5.1)
SODIUM: 139 mmol/L (ref 135–145)
Total Bilirubin: 0.6 mg/dL (ref 0.3–1.2)
Total Protein: 6.8 g/dL (ref 6.5–8.1)

## 2015-11-16 LAB — I-STAT BETA HCG BLOOD, ED (MC, WL, AP ONLY): I-stat hCG, quantitative: <5 m[IU]/mL (ref <5)

## 2015-11-16 LAB — LIPASE, BLOOD: LIPASE: 19 U/L (ref 11–51)

## 2015-11-16 MED ORDER — DICYCLOMINE HCL 10 MG PO CAPS
10.0000 mg | ORAL_CAPSULE | Freq: Once | ORAL | Status: AC
  Administered 2015-11-16: 10 mg via ORAL
  Filled 2015-11-16: qty 1

## 2015-11-16 NOTE — ED Provider Notes (Signed)
CSN: [id number]     Arrival date & time 11/16/15  1220 History   First MD Initiated Contact with Patient 11/16/15 1310     Chief Complaint  Patient presents with  . Abdominal Pain     (Consider location/radiation/quality/duration/timing/severity/associated sxs/prior Treatment) HPI Comments: 25 year old female presents with progressive abdominal pain and constipation x2 weeks.  She describes her pain as persistent LLQ cramping.  She states she has not had a normal bowel movement in 2 weeks, but has had a couple small, hard bowel movements.  She also endorses nausea, vomiting x4 yesterday, and constipation.  She denies fever, chills, urinary symptoms, melena, hematochezia, or hematemesis.  She also denies recent illness, history of abdominal surgery, or use of medications. She states that she has had similar pain in the past.  The patient does have a history of constipation for several years and reports that she was diagnosed with IBS a couple months ago by her gastroenterologist, but she states she cannot afford the medications she was prescribed.  She does use OTC stool softeners at home without success, and states only enemas relieve her constipation.   Past Medical History  Diagnosis Date  . Asthma   . IBS (irritable bowel syndrome)    History reviewed. No pertinent past surgical history. No family history on file. Social History  Substance Use Topics  . Smoking status: Current Every Day Smoker    Types: Cigarettes    Last Attempt to Quit: 05/15/2015  . Smokeless tobacco: None  . Alcohol Use: No     Comment: occ   OB History    No data available     Review of Systems  All other systems reviewed and are negative.     Allergies  Review of patient's allergies indicates no known allergies.  Home Medications   Prior to Admission medications   Medication Sig Start Date End Date Taking? Authorizing Provider  docusate sodium (COLACE) 100 MG capsule Take 100 mg by mouth 2 (two)  times daily as needed for mild constipation.   Yes Historical Provider, MD  ibuprofen (ADVIL,MOTRIN) 200 MG tablet Take 200 mg by mouth every 6 (six) hours as needed for moderate pain.   Yes Historical Provider, MD  Probiotic Product (PROBIOTIC DAILY PO) Take 1 tablet by mouth daily.   Yes Historical Provider, MD  cephALEXin (KEFLEX) 500 MG capsule Take 1 capsule (500 mg total) by mouth 4 (four) times daily. Patient not taking: Reported on 09/15/2015 08/18/15   Everlene FarrierWilliam Dansie, PA-C  dicyclomine (BENTYL) 20 MG tablet Take 1 tablet (20 mg total) by mouth 2 (two) times daily. Patient not taking: Reported on 11/16/2015 09/15/15   Gilda Creasehristopher J Pollina, MD  famotidine (PEPCID) 20 MG tablet Take 1 tablet (20 mg total) by mouth 2 (two) times daily. Patient not taking: Reported on 11/16/2015 09/15/15   Gilda Creasehristopher J Pollina, MD  naproxen (NAPROSYN) 250 MG tablet Take 1 tablet (250 mg total) by mouth 2 (two) times daily with a meal. Patient not taking: Reported on 09/15/2015 08/18/15   Everlene FarrierWilliam Dansie, PA-C  ondansetron (ZOFRAN ODT) 4 MG disintegrating tablet Take 1 tablet (4 mg total) by mouth every 8 (eight) hours as needed for nausea or vomiting. Patient not taking: Reported on 09/15/2015 08/18/15   Everlene FarrierWilliam Dansie, PA-C  traMADol (ULTRAM) 50 MG tablet Take 1 tablet (50 mg total) by mouth every 6 (six) hours as needed. Patient not taking: Reported on 11/16/2015 09/15/15   Gilda Creasehristopher J Pollina, MD  BP 127/79 mmHg  Pulse 73  Temp(Src) 98.8 F (37.1 C) (Oral)  Resp 18  Ht 5\' 6"  (1.676 m)  Wt 58.151 kg  BMI 20.70 kg/m2  SpO2 100%  LMP 11/13/2015 Physical Exam  Constitutional: She appears well-developed and well-nourished.  HENT:  Head: Normocephalic and atraumatic.  Neck: Normal range of motion. Neck supple.  Cardiovascular: Normal rate, regular rhythm and normal heart sounds.   Pulmonary/Chest: Effort normal and breath sounds normal.  Abdominal: Soft. Bowel sounds are normal. She exhibits no distension and no mass.  There is tenderness. There is no rebound and no guarding.  Mild tenderness to palpation of the LLQ  Neurological: She is alert.  Skin: Skin is warm and dry.  Psychiatric: She has a normal mood and affect.  Nursing note and vitals reviewed.   ED Course  Procedures (including critical care time) Labs Review Labs Reviewed  COMPREHENSIVE METABOLIC PANEL - Abnormal; Notable for the following:    BUN 5 (*)    AST 12 (*)    ALT 7 (*)    All other components within normal limits  URINALYSIS, ROUTINE W REFLEX MICROSCOPIC (NOT AT [facility]) - Abnormal; Notable for the following:    pH 8.5 (*)    Hgb urine dipstick LARGE (*)    All other components within normal limits  URINE MICROSCOPIC-ADD ON - Abnormal; Notable for the following:    Squamous Epithelial / LPF 0-5 (*)    Bacteria, UA RARE (*)    All other components within normal limits  LIPASE, BLOOD  CBC  I-STAT BETA HCG BLOOD, ED ([facility], [facility], [facility] ONLY)    Imaging Review Dg Abd Acute W/chest  11/16/2015  CLINICAL DATA:  Abdominal pain and constipation EXAM: DG ABDOMEN ACUTE W/ 1V CHEST COMPARISON:  08/11/2015 abdominal series FINDINGS: There is no evidence of dilated bowel loops or free intraperitoneal air. No abnormal stool retention or impaction. No radiopaque calculi or other significant radiographic abnormality is seen. Heart size and mediastinal contours are within normal limits. Both lungs are clear. IMPRESSION: Negative abdominal radiographs.  No acute cardiopulmonary disease. Electronically Signed   By: Marnee SpringJonathon  Watts M.D.   On: 11/16/2015 14:37   I have personally reviewed and evaluated these images and lab results as part of my medical decision-making.   EKG Interpretation None      MDM   Final diagnoses:  None   Patient presents today with LLQ abdominal pain.  She has a history of the same.  She states that she was recently seen by GI and was diagnosed with IBS.  She does have some tenderness to palpation of the LLQ on exam,  but no rebound or guarding.  She reports that she did vomit yesterday, but no vomiting today.  Able to tolerate PO liquids in the ED.  No history of abdominal surgeries.  Acute Ab series is negative.  No evidence of obstruction or free intraperitoneal air.  She states that she is constipated, but declined any medication or enema in the ED.  Feel that the patient is stable for discharge.  Return precautions given.      Santiago GladHeather Posie Lillibridge, PA-C 11/17/15 2135  Glynn OctaveStephen Rancour, MD 11/18/15 [phone]

## 2015-11-16 NOTE — ED Notes (Signed)
Patient complains of LLQ abdominal cramping for the past several days. Developed vomiting with same last pm and into this am, reports chronic constipation butt did have BM this am. Has IBS

## 2015-11-16 NOTE — Discharge Instructions (Signed)

## 2015-11-16 NOTE — ED Notes (Signed)
Pt verbalized understanding of d/c instructions and has no further questions. Pt stable and NAD. Pt removed all belongings from room.

## 2016-01-11 ENCOUNTER — Encounter (HOSPITAL_COMMUNITY): Payer: Self-pay | Admitting: *Deleted

## 2016-01-11 DIAGNOSIS — N939 Abnormal uterine and vaginal bleeding, unspecified: Secondary | ICD-10-CM | POA: Insufficient documentation

## 2016-01-11 DIAGNOSIS — F17210 Nicotine dependence, cigarettes, uncomplicated: Secondary | ICD-10-CM | POA: Insufficient documentation

## 2016-01-11 DIAGNOSIS — J45909 Unspecified asthma, uncomplicated: Secondary | ICD-10-CM | POA: Insufficient documentation

## 2016-01-11 DIAGNOSIS — R102 Pelvic and perineal pain: Secondary | ICD-10-CM | POA: Insufficient documentation

## 2016-01-11 NOTE — ED Triage Notes (Signed)
Pt now reporting lower abdominal pain with large amount of bleeding and clots. Hx of one previous miscarriage, thinks she may be having a miscarriage at present.

## 2016-01-11 NOTE — ED Triage Notes (Signed)
Pt c/o abdominal pain associated with NVx 2. Pt unable to describe pain, tearful and rocking back and forth in triage.

## 2016-01-12 ENCOUNTER — Emergency Department (HOSPITAL_COMMUNITY): Payer: Self-pay

## 2016-01-12 ENCOUNTER — Emergency Department (HOSPITAL_COMMUNITY)
Admission: EM | Admit: 2016-01-12 | Discharge: 2016-01-12 | Disposition: A | Discharge status: Home / Self Care | Payer: Self-pay | Attending: Emergency Medicine | Admitting: Emergency Medicine

## 2016-01-12 DIAGNOSIS — R1084 Generalized abdominal pain: Secondary | ICD-10-CM

## 2016-01-12 DIAGNOSIS — N939 Abnormal uterine and vaginal bleeding, unspecified: Secondary | ICD-10-CM

## 2016-01-12 LAB — COMPREHENSIVE METABOLIC PANEL
ALBUMIN: 3.5 g/dL (ref 3.5–5.0)
ALK PHOS: 42 U/L (ref 38–126)
ALT: 7 U/L — AB (ref 14–54)
ANION GAP: 5 (ref 5–15)
AST: 13 U/L — ABNORMAL LOW (ref 15–41)
BUN: 6 mg/dL (ref 6–20)
CALCIUM: 8.4 mg/dL — AB (ref 8.9–10.3)
CO2: 22 mmol/L (ref 22–32)
CREATININE: 0.64 mg/dL (ref 0.44–1.00)
Chloride: 110 mmol/L (ref 101–111)
GFR calc Af Amer: >60 mL/min (ref >60)
GFR calc non Af Amer: >60 mL/min (ref >60)
GLUCOSE: 95 mg/dL (ref 65–99)
Potassium: 3.3 mmol/L — ABNORMAL LOW (ref 3.5–5.1)
SODIUM: 137 mmol/L (ref 135–145)
Total Bilirubin: 0.7 mg/dL (ref 0.3–1.2)
Total Protein: 5.5 g/dL — ABNORMAL LOW (ref 6.5–8.1)

## 2016-01-12 LAB — DIFFERENTIAL
BASOS PCT: 0 %
Basophils Absolute: 0 10*3/uL (ref 0.0–0.1)
EOS PCT: 1 %
Eosinophils Absolute: 0.1 10*3/uL (ref 0.0–0.7)
Lymphocytes Relative: 27 %
Lymphs Abs: 2 10*3/uL (ref 0.7–4.0)
MONO ABS: 0.6 10*3/uL (ref 0.1–1.0)
Monocytes Relative: 7 %
NEUTROS ABS: 4.8 10*3/uL (ref 1.7–7.7)
NEUTROS PCT: 65 %

## 2016-01-12 LAB — WET PREP, GENITAL
SPERM: NONE SEEN
TRICH WET PREP: NONE SEEN
Yeast Wet Prep HPF POC: NONE SEEN

## 2016-01-12 LAB — CBC
HCT: 35.5 % — ABNORMAL LOW (ref 36.0–46.0)
HEMOGLOBIN: 12.2 g/dL (ref 12.0–15.0)
MCH: 30.4 pg (ref 26.0–34.0)
MCHC: 34.4 g/dL (ref 30.0–36.0)
MCV: 88.5 fL (ref 78.0–100.0)
Platelets: 211 10*3/uL (ref 150–400)
RBC: 4.01 MIL/uL (ref 3.87–5.11)
RDW: 13.2 % (ref 11.5–15.5)
WBC: 7.4 10*3/uL (ref 4.0–10.5)

## 2016-01-12 LAB — GC/CHLAMYDIA PROBE AMP (~~LOC~~) NOT AT ARMC
CHLAMYDIA, DNA PROBE: NEGATIVE
Neisseria Gonorrhea: NEGATIVE

## 2016-01-12 LAB — URINALYSIS, ROUTINE W REFLEX MICROSCOPIC
BILIRUBIN URINE: NEGATIVE
GLUCOSE, UA: NEGATIVE mg/dL
KETONES UR: 15 mg/dL — AB
Leukocytes, UA: NEGATIVE
NITRITE: NEGATIVE
PH: 6 (ref 5.0–8.0)
Protein, ur: NEGATIVE mg/dL
Specific Gravity, Urine: 1.013 (ref 1.005–1.030)

## 2016-01-12 LAB — URINE MICROSCOPIC-ADD ON: WBC, UA: NONE SEEN WBC/hpf (ref 0–5)

## 2016-01-12 LAB — ABO/RH: ABO/RH(D): O POS

## 2016-01-12 LAB — LIPASE, BLOOD: Lipase: 26 U/L (ref 11–51)

## 2016-01-12 LAB — HCG, QUANTITATIVE, PREGNANCY

## 2016-01-12 MED ORDER — SODIUM CHLORIDE 0.9 % IV BOLUS (SEPSIS)
1000.0000 mL | Freq: Once | INTRAVENOUS | Status: AC
  Administered 2016-01-12: 1000 mL via INTRAVENOUS

## 2016-01-12 MED ORDER — IBUPROFEN 800 MG PO TABS: 800.0000 mg | ORAL_TABLET | Freq: Three times a day (TID) | ORAL | 0 refills | Status: DC | PRN

## 2016-01-12 MED ORDER — METOCLOPRAMIDE HCL 5 MG/ML IJ SOLN
10.0000 mg | Freq: Once | INTRAMUSCULAR | Status: AC
  Administered 2016-01-12: 10 mg via INTRAVENOUS
  Filled 2016-01-12: qty 2

## 2016-01-12 MED ORDER — KETOROLAC TROMETHAMINE 30 MG/ML IJ SOLN
30.0000 mg | Freq: Once | INTRAMUSCULAR | Status: AC
Start: 2016-01-12 — End: 2016-01-12
  Administered 2016-01-12: 30 mg via INTRAVENOUS
  Filled 2016-01-12: qty 1

## 2016-01-12 MED ORDER — PROMETHAZINE HCL 25 MG PO TABS: 25.0000 mg | ORAL_TABLET | Freq: Four times a day (QID) | ORAL | 0 refills | Status: DC | PRN

## 2016-01-12 MED ORDER — DIPHENHYDRAMINE HCL 50 MG/ML IJ SOLN
25.0000 mg | Freq: Once | INTRAMUSCULAR | Status: AC
  Administered 2016-01-12: 25 mg via INTRAVENOUS
  Filled 2016-01-12: qty 1

## 2016-01-12 MED ORDER — ACETAMINOPHEN 500 MG PO TABS
1000.0000 mg | ORAL_TABLET | Freq: Once | ORAL | Status: AC
  Administered 2016-01-12: 1000 mg via ORAL
  Filled 2016-01-12: qty 2

## 2016-01-12 NOTE — Discharge Instructions (Signed)
°  [facility] °[email or web address] °[address] °[place], [place] °[phone]  ° ° °[facility] °[email or web address] °[address] °[place], [place] °[phone]  ° ° °[facility] °[address] °[place], [place] °[phone]  ° °[facility] °[email or web address] °[address] °[place], [place] °[phone]  ° °[facility] °[email or web address] °[address] °[place], [place] °[phone]  ° °[facility] °[email or web address] °[address] °[place], [place] °[phone] ° °

## 2016-01-12 NOTE — ED Notes (Signed)
EDP at bedside  

## 2016-01-12 NOTE — ED Notes (Signed)
Patient transported to Ultrasound 

## 2016-01-12 NOTE — ED Provider Notes (Signed)
By signing my name below, I, Emmanuella Mensah, attest that this documentation has been prepared under the direction and in the presence of Kristen N Ward, DO. Electronically Signed: Angelene GiovanniEmmanuella Mensah, ED Scribe. 01/12/16. 1:15 AM.   TIME SEEN: 1:03 AM   CHIEF COMPLAINT: Abdominal pain   HPI:  Audrey Pearson is a 25 y.o. female who presents to the Emergency Department complaining of gradually worsening cramping moderate generalized abdominal pain onset today. She reports associated vaginal bleeding and decreased appetite. She attributes her vaginal bleeding to her menstrual cycle but states it is early for her. She adds that she has had nausea and multiple episodes of non-bloody vomiting for one week. No alleviating factors noted. Pt has not tried any medications PTA. She reports that her LKMP was 12/18/15. She is a G1 P0 A1. She is concerned that she could be having another miscarriage today. She denies any hx of abdominal surgeries. No fever or chills.   ROS: See HPI Constitutional: no fever  Eyes: no drainage  ENT: no runny nose   Cardiovascular:  no chest pain  Resp: no SOB  GI: vomiting GU: no dysuria Integumentary: no rash  Allergy: no hives  Musculoskeletal: no leg swelling  Neurological: no slurred speech ROS otherwise negative  PAST MEDICAL HISTORY/PAST SURGICAL HISTORY:  Past Medical History:  Diagnosis Date  . Asthma   . IBS (irritable bowel syndrome)     MEDICATIONS:  Prior to Admission medications   Medication Sig Start Date End Date Taking? Authorizing Provider  cephALEXin (KEFLEX) 500 MG capsule Take 1 capsule (500 mg total) by mouth 4 (four) times daily. Patient not taking: Reported on 09/15/2015 08/18/15   Everlene FarrierWilliam Dansie, PA-C  dicyclomine (BENTYL) 20 MG tablet Take 1 tablet (20 mg total) by mouth 2 (two) times daily. Patient not taking: Reported on 11/16/2015 09/15/15   Gilda Creasehristopher J Pollina, MD  docusate sodium (COLACE) 100 MG capsule Take 100 mg by mouth 2 (two) times  daily as needed for mild constipation.    Historical Provider, MD  famotidine (PEPCID) 20 MG tablet Take 1 tablet (20 mg total) by mouth 2 (two) times daily. Patient not taking: Reported on 11/16/2015 09/15/15   Gilda Creasehristopher J Pollina, MD  ibuprofen (ADVIL,MOTRIN) 200 MG tablet Take 200 mg by mouth every 6 (six) hours as needed for moderate pain.    Historical Provider, MD  naproxen (NAPROSYN) 250 MG tablet Take 1 tablet (250 mg total) by mouth 2 (two) times daily with a meal. Patient not taking: Reported on 09/15/2015 08/18/15   Everlene FarrierWilliam Dansie, PA-C  ondansetron (ZOFRAN ODT) 4 MG disintegrating tablet Take 1 tablet (4 mg total) by mouth every 8 (eight) hours as needed for nausea or vomiting. Patient not taking: Reported on 09/15/2015 08/18/15   Everlene FarrierWilliam Dansie, PA-C  Probiotic Product (PROBIOTIC DAILY PO) Take 1 tablet by mouth daily.    Historical Provider, MD  traMADol (ULTRAM) 50 MG tablet Take 1 tablet (50 mg total) by mouth every 6 (six) hours as needed. Patient not taking: Reported on 11/16/2015 09/15/15   Gilda Creasehristopher J Pollina, MD    ALLERGIES:  No Known Allergies  SOCIAL HISTORY:  Social History  Substance Use Topics  . Smoking status: Current Every Day Smoker    Types: Cigarettes    Last attempt to quit: 05/15/2015  . Smokeless tobacco: Never Used  . Alcohol use No     Comment: occ    FAMILY HISTORY: No family history on file.  EXAM: BP 116/89 (BP  Location: Left Arm)   Pulse 89   Temp 98 F (36.7 C) (Oral)   Resp 16   LMP 01/08/2016   SpO2 100%  CONSTITUTIONAL: Alert and oriented and responds appropriately to questions. Well-appearing; well-nourished, Appears uncomfortable, tearful. Appears afebrile, non-toxic HEAD: Normocephalic EYES: Conjunctivae clear, PERRL ENT: normal nose; no rhinorrhea; moist mucous membranes NECK: Supple, no meningismus, no LAD  CARD: RRR; S1 and S2 appreciated; no murmurs, no clicks, no rubs, no gallops RESP: Normal chest excursion without splinting or  tachypnea; breath sounds clear and equal bilaterally; no wheezes, no rhonchi, no rales, no hypoxia or respiratory distress, speaking full sentences ABD/GI: Normal bowel sounds; non-distended; soft, Diffusely TTP, no rebound, no guarding, no peritoneal signs GU:  Normal external genitalia. No lesions, rashes noted. Patient has mild amount of dark red vaginal bleeding on exam coming from the cervical os. No appreciable vaginal discharge.  Patient does have bilateral adnexal tenderness without fullness. No cervical motion tenderness.. Cervix is not appear friable.  Cervix is closed.  Chaperone present for exam. BACK:  The back appears normal and is non-tender to palpation, there is no CVA tenderness EXT: Normal ROM in all joints; non-tender to palpation; no edema; normal capillary refill; no cyanosis, no calf tenderness or swelling    SKIN: Normal color for age and race; warm; no rash NEURO: Moves all extremities equally, sensation to light touch intact diffusely, cranial nerves II through XII intact PSYCH: The patient's mood and manner are appropriate. Grooming and personal hygiene are appropriate.  MEDICAL DECISION MAKING: Patient here with complaints of abdominal pain, vaginal bleeding. States that this is an early menstrual cycle for her and she is concerned she could be pregnant. Has been pregnant once before and has had a miscarriage. No history of STDs. No fever. States she has been vomiting recently. No diarrhea. Pregnancy test in the emergency department is negative. She does state she has a history of endometriosis which may be causing her pain. We'll treat with Toradol, Reglan and reassess. We'll obtain labs, urine, pelvic cultures and transvaginal ultrasound to rule out torsion, TOA, cyst. Doubt PID given no cervical motion tenderness or significant discharge.   ED PROGRESS: Patient's labs are unremarkable. Transvaginal ultrasound shows no acute abnormality. Urine shows blood but this is likely  from her vaginal bleeding. No other sign of infection. She does have clue cells on her wet prep given no odor, itching, significant vaginal discharge, do not feel a CT be treated at this time. I suspect that her pain is from her endometriosis. She reports feeling much better after Toradol. We'll discharge with ibuprofen, Phenergan and have her follow-up with OB/GYN as an outpatient. Doubt appendicitis, colitis, diverticulitis, bowel obstruction or other surgical, life-threatening pathology. This time I do not feel she needs a CT of her abdomen and pelvis. Abdominal exam is now completely benign.   At this time, I do not feel there is any life-threatening condition present. I have reviewed and discussed all results (EKG, imaging, lab, urine as appropriate), exam findings with patient/family. I have reviewed nursing notes and appropriate previous records.  I feel the patient is safe to be discharged home without further emergent workup and can continue workup as an outpatient as needed. Discussed usual and customary return precautions. Patient/family verbalize understanding and are comfortable with this plan.  Outpatient follow-up has been provided. All questions have been answered.  I personally performed the services described in this documentation, which was scribed in my presence. The recorded information  has been reviewed and is accurate.    Layla Maw Ward, DO 01/12/16 [id number]

## 2016-03-24 ENCOUNTER — Encounter (HOSPITAL_COMMUNITY): Payer: Self-pay

## 2016-03-24 ENCOUNTER — Emergency Department (HOSPITAL_COMMUNITY)
Admission: EM | Admit: 2016-03-24 | Discharge: 2016-03-24 | Disposition: A | Discharge status: Home / Self Care | Payer: Medicaid Other | Attending: Emergency Medicine | Admitting: Emergency Medicine

## 2016-03-24 ENCOUNTER — Emergency Department (HOSPITAL_COMMUNITY): Payer: Medicaid Other

## 2016-03-24 DIAGNOSIS — F17210 Nicotine dependence, cigarettes, uncomplicated: Secondary | ICD-10-CM | POA: Diagnosis not present

## 2016-03-24 DIAGNOSIS — J45909 Unspecified asthma, uncomplicated: Secondary | ICD-10-CM | POA: Insufficient documentation

## 2016-03-24 DIAGNOSIS — R05 Cough: Secondary | ICD-10-CM | POA: Insufficient documentation

## 2016-03-24 DIAGNOSIS — R059 Cough, unspecified: Secondary | ICD-10-CM

## 2016-03-24 DIAGNOSIS — R6889 Other general symptoms and signs: Secondary | ICD-10-CM

## 2016-03-24 MED ORDER — IPRATROPIUM-ALBUTEROL 0.5-2.5 (3) MG/3ML IN SOLN
3.0000 mL | Freq: Once | RESPIRATORY_TRACT | Status: AC
  Administered 2016-03-24: 3 mL via RESPIRATORY_TRACT
  Filled 2016-03-24: qty 3

## 2016-03-24 MED ORDER — PREDNISONE 20 MG PO TABS
40.0000 mg | ORAL_TABLET | Freq: Every day | ORAL | 0 refills | Status: DC
Start: 2016-03-24 — End: 2016-05-18

## 2016-03-24 NOTE — ED Triage Notes (Signed)
Patient here with cough, body aches, chills and fever x 3 days. States that she has fatigue with same. Took tylenol at 0730 this am.

## 2016-03-24 NOTE — Discharge Instructions (Signed)
Use over the counter cold medications such as dayquil/nyquil.  You appear to have an upper respiratory infection (URI). An upper respiratory tract infection, or cold, is a viral infection of the air passages leading to the lungs. It is contagious and can be spread to others, especially during the first 3 or 4 days. It cannot be cured by antibiotics or other medicines. RETURN IMMEDIATELY IF you develop shortness of breath, confusion or altered mental status, a new rash, become dizzy, faint, or poorly responsive, or are unable to be cared for at home.

## 2016-03-24 NOTE — ED Provider Notes (Signed)
[facility]-EMERGENCY DEPT Provider Note   CSN: [id number] Arrival date & time: 03/24/16  1011   By signing my name below, I, Teofilo PodMatthew P. Jamison, attest that this documentation has been prepared under the direction and in the presence of Arthor CaptainAbigail Tsuneo Faison, PA-C. Electronically Signed: Teofilo PodMatthew P. Jamison, ED Scribe. 03/24/2016. 11:54 AM.   History   Chief Complaint No chief complaint on file.   The history is provided by the patient. No language interpreter was used.   HPI Comments:  Audrey Pearson is a 25 y.o. female with PMHx of asthma who presents to the Emergency Department, here with multiple complaints x 3 days. Pt complains of associated generalized body aches, general fatigue, chills, fever, and productive cough. All symptoms have remained constant. Pt also reports that she had rhinorrhea last night, but it has since resolved. Pt measured her fever at 103 at home PTA.  Pt took tylenol at 0730 today with mild relief of fever. Pt notes that she has been using her inhaler more often. Pt denies sick contact. Pt denies other associated symptoms.   Past Medical History:  Diagnosis Date  . Asthma   . IBS (irritable bowel syndrome)     There are no active problems to display for this patient.   History reviewed. No pertinent surgical history.  OB History    No data available       Home Medications    Prior to Admission medications   Medication Sig Start Date End Date Taking? Authorizing Provider  albuterol (PROVENTIL HFA;VENTOLIN HFA) 108 (90 Base) MCG/ACT inhaler Inhale 1-2 puffs into the lungs every 6 (six) hours as needed for wheezing or shortness of breath.    Historical Provider, MD  docusate sodium (COLACE) 100 MG capsule Take 100 mg by mouth 2 (two) times daily as needed for mild constipation.    Historical Provider, MD  ibuprofen (ADVIL,MOTRIN) 800 MG tablet Take 1 tablet (800 mg total) by mouth every 8 (eight) hours as needed for mild pain. 01/12/16   Kristen N Ward, DO    promethazine (PHENERGAN) 25 MG tablet Take 1 tablet (25 mg total) by mouth every 6 (six) hours as needed for nausea or vomiting. 01/12/16   Layla MawKristen N Ward, DO    Family History No family history on file.  Social History Social History  Substance Use Topics  . Smoking status: Current Every Day Smoker    Types: Cigarettes    Last attempt to quit: 05/15/2015  . Smokeless tobacco: Never Used  . Alcohol use No     Comment: occ     Allergies   Patient has no known allergies.   Review of Systems Review of Systems  Constitutional: Positive for chills, fatigue and fever.  Respiratory: Positive for cough.   Musculoskeletal: Positive for myalgias.     Physical Exam Updated Vital Signs BP 133/85 (BP Location: Left Arm)   Pulse 98   Temp 99.3 F (37.4 C) (Oral)   Resp 18   Wt 128 lb (58.1 kg)   SpO2 100%   BMI 20.66 kg/m   Physical Exam Physical Exam  Constitutional: Pt  is oriented to person, place, and time. Glassy-eyed appearance, appears ill. Appears well-developed and well-nourished. No distress.  HENT:  Head: Normocephalic and atraumatic.  Right Ear: Tympanic membrane, external ear and ear canal normal.  Left Ear: Tympanic membrane, external ear and ear canal normal.  Nose: Mucosal edema and no rhinorrhea present. No epistaxis. Right sinus exhibits no maxillary sinus tenderness and  no frontal sinus tenderness. Left sinus exhibits no maxillary sinus tenderness and no frontal sinus tenderness.  Mouth/Throat: Uvula is midline and mucous membranes are normal. Mucous membranes are not pale and not cyanotic. No oropharyngeal exudate, posterior oropharyngeal edema, posterior oropharyngeal erythema or tonsillar abscesses.  Eyes: Conjunctivae are normal. Pupils are equal, round, and reactive to light.  Neck: Normal range of motion and full passive range of motion without pain.  Cardiovascular: Normal rate and intact distal pulses.   Pulmonary/Chest: Effort normal and breath  sounds normal. No stridor.  Dry cough. Clear and equal breath sounds without focal wheezes, rhonchi, rales  Abdominal: Soft. Bowel sounds are normal. There is no tenderness.  Musculoskeletal: Normal range of motion.  Lymphadenopathy:    Pthas no cervical adenopathy.  Neurological: Pt is alert and oriented to person, place, and time.  Skin: Skin is warm and dry. No rash noted. Pt is not diaphoretic.  Psychiatric: Normal mood and affect.  Nursing note and vitals reviewed.   ED Treatments / Results  DIAGNOSTIC STUDIES:  Oxygen Saturation is 100% on RA, normal by my interpretation.    COORDINATION OF CARE:  11:54 AM Discussed treatment plan with pt at bedside and pt agreed to plan.   Labs (all labs ordered are listed, but only abnormal results are displayed) Labs Reviewed - No data to display  EKG  EKG Interpretation None       Radiology No results found.  Procedures Procedures (including critical care time)  Medications Ordered in ED Medications - No data to display   Initial Impression / Assessment and Plan / ED Course  Pt symptoms consistent with URI. CXR negative for acute infiltrate. Pt will be discharged with symptomatic treatment.  Discussed return precautions.  Pt is hemodynamically stable & in NAD prior to discharge.   I have reviewed the triage vital signs and the nursing notes.  Pertinent labs & imaging results that were available during my care of the patient were reviewed by me and considered in my medical decision making (see chart for details).  Clinical Course     \  Final Clinical Impressions(s) / ED Diagnoses   Final diagnoses:  Cough    New Prescriptions New Prescriptions   No medications on file  I personally performed the services described in this documentation, which was scribed in my presence. The recorded information has been reviewed and is accurate.        Arthor Captainbigail Boateng [place], PA-C 03/24/16 1247    Benjiman CoreNathan Pickering,  MD 03/24/16 1536

## 2016-05-01 ENCOUNTER — Inpatient Hospital Stay (HOSPITAL_COMMUNITY)
Admission: AD | Admit: 2016-05-01 | Discharge: 2016-05-02 | Disposition: A | Discharge status: Home / Self Care | Payer: Medicaid Other | Source: Ambulatory Visit | Attending: Obstetrics and Gynecology | Admitting: Obstetrics and Gynecology

## 2016-05-01 ENCOUNTER — Encounter (HOSPITAL_COMMUNITY): Payer: Self-pay | Admitting: *Deleted

## 2016-05-01 DIAGNOSIS — R109 Unspecified abdominal pain: Secondary | ICD-10-CM | POA: Diagnosis not present

## 2016-05-01 DIAGNOSIS — O26891 Other specified pregnancy related conditions, first trimester: Secondary | ICD-10-CM | POA: Diagnosis not present

## 2016-05-01 DIAGNOSIS — O210 Mild hyperemesis gravidarum: Secondary | ICD-10-CM | POA: Diagnosis not present

## 2016-05-01 DIAGNOSIS — F17210 Nicotine dependence, cigarettes, uncomplicated: Secondary | ICD-10-CM | POA: Insufficient documentation

## 2016-05-01 DIAGNOSIS — O26899 Other specified pregnancy related conditions, unspecified trimester: Secondary | ICD-10-CM

## 2016-05-01 DIAGNOSIS — O208 Other hemorrhage in early pregnancy: Secondary | ICD-10-CM | POA: Insufficient documentation

## 2016-05-01 DIAGNOSIS — O99331 Smoking (tobacco) complicating pregnancy, first trimester: Secondary | ICD-10-CM | POA: Diagnosis not present

## 2016-05-01 DIAGNOSIS — O219 Vomiting of pregnancy, unspecified: Secondary | ICD-10-CM

## 2016-05-01 DIAGNOSIS — Z3A01 Less than 8 weeks gestation of pregnancy: Secondary | ICD-10-CM | POA: Insufficient documentation

## 2016-05-01 DIAGNOSIS — O468X1 Other antepartum hemorrhage, first trimester: Secondary | ICD-10-CM

## 2016-05-01 DIAGNOSIS — O418X10 Other specified disorders of amniotic fluid and membranes, first trimester, not applicable or unspecified: Secondary | ICD-10-CM

## 2016-05-01 LAB — URINALYSIS, ROUTINE W REFLEX MICROSCOPIC
Bilirubin Urine: NEGATIVE
Glucose, UA: NEGATIVE mg/dL
Hgb urine dipstick: NEGATIVE
Ketones, ur: NEGATIVE mg/dL
LEUKOCYTES UA: NEGATIVE
NITRITE: NEGATIVE
PH: 7 (ref 5.0–8.0)
Protein, ur: NEGATIVE mg/dL
SPECIFIC GRAVITY, URINE: 1.01 (ref 1.005–1.030)

## 2016-05-01 LAB — POCT PREGNANCY, URINE: Preg Test, Ur: POSITIVE — AB

## 2016-05-01 NOTE — MAU Note (Signed)
Pt states she has not been able to keep anything down all day today and this pm she started having sharp left lower abd pain.

## 2016-05-02 ENCOUNTER — Encounter (HOSPITAL_COMMUNITY): Payer: Self-pay | Admitting: Medical

## 2016-05-02 ENCOUNTER — Inpatient Hospital Stay (HOSPITAL_COMMUNITY): Payer: Medicaid Other

## 2016-05-02 DIAGNOSIS — O26891 Other specified pregnancy related conditions, first trimester: Secondary | ICD-10-CM

## 2016-05-02 DIAGNOSIS — R109 Unspecified abdominal pain: Secondary | ICD-10-CM

## 2016-05-02 LAB — WET PREP, GENITAL
CLUE CELLS WET PREP: NONE SEEN
Sperm: NONE SEEN
TRICH WET PREP: NONE SEEN
YEAST WET PREP: NONE SEEN

## 2016-05-02 LAB — CBC WITH DIFFERENTIAL/PLATELET
BASOS ABS: 0.1 10*3/uL (ref 0.0–0.1)
BASOS PCT: 1 %
EOS PCT: 1 %
Eosinophils Absolute: 0.1 10*3/uL (ref 0.0–0.7)
HCT: 34.8 % — ABNORMAL LOW (ref 36.0–46.0)
Hemoglobin: 12.5 g/dL (ref 12.0–15.0)
LYMPHS PCT: 20 %
Lymphs Abs: 2 10*3/uL (ref 0.7–4.0)
MCH: 31.5 pg (ref 26.0–34.0)
MCHC: 35.9 g/dL (ref 30.0–36.0)
MCV: 87.7 fL (ref 78.0–100.0)
MONO ABS: 0.3 10*3/uL (ref 0.1–1.0)
Monocytes Relative: 3 %
Neutro Abs: 8 10*3/uL — ABNORMAL HIGH (ref 1.7–7.7)
Neutrophils Relative %: 75 %
PLATELETS: 215 10*3/uL (ref 150–400)
RBC: 3.97 MIL/uL (ref 3.87–5.11)
RDW: 13.5 % (ref 11.5–15.5)
WBC: 10.4 10*3/uL (ref 4.0–10.5)

## 2016-05-02 LAB — HIV ANTIBODY (ROUTINE TESTING W REFLEX): HIV Screen 4th Generation wRfx: NONREACTIVE

## 2016-05-02 LAB — RPR: RPR Ser Ql: NONREACTIVE

## 2016-05-02 LAB — HCG, QUANTITATIVE, PREGNANCY: HCG, BETA CHAIN, QUANT, S: 103972 m[IU]/mL — AB (ref <5)

## 2016-05-02 MED ORDER — PROMETHAZINE HCL 25 MG PO TABS
25.0000 mg | ORAL_TABLET | Freq: Four times a day (QID) | ORAL | 0 refills | Status: DC | PRN
Start: 2016-05-02 — End: 2016-07-30

## 2016-05-02 NOTE — Discharge Instructions (Signed)
Eating Plan for Nausea and Vomiting in pregnancy Hyperemesis gravidarum is a severe form of morning sickness. Because this condition causes severe nausea and vomiting, it can lead to dehydration, malnutrition, and weight loss. One way to lessen the symptoms of nausea and vomiting is to follow the eating plan for hyperemesis gravidarum. It is often used along with prescribed medicines to control your symptoms. What can I do to relieve my symptoms? Listen to your body. Everyone is different and has different preferences. Find what works best for you. Take any of the following actions that are helpful to you:  Eat and drink slowly.  Eat 5-6 small meals daily instead of 3 large meals.  Eat crackers before you get out of bed in the morning.  Try having a snack in the middle of the night.  Starchy foods are usually tolerated well. Examples include cereal, toast, bread, potatoes, pasta, rice, and pretzels.  Ginger may help with nausea. Add  tsp ground ginger to hot tea or choose ginger tea.  Try drinking 100% fruit juice or an electrolyte drink. An electrolyte drink contains sodium, potassium, and chloride.  Continue to take your prenatal vitamins as told by your health care provider. If you are having trouble taking your prenatal vitamins, talk with your health care provider about different options.  Include at least 1 serving of protein with your meals and snacks. Protein options include meats or poultry, beans, nuts, eggs, and yogurt. Try eating a protein-rich snack before bed. Examples of these snacks include cheese and crackers or half of a peanut butter or [place] sandwich.  Consider eliminating foods that trigger your symptoms. These may include spicy foods, coffee, high-fat foods, very sweet foods, and acidic foods.  Try meals that have more protein combined with bland, salty, lower-fat, and dry foods, such as nuts, seeds, pretzels, crackers, and cereal.  Talk with your healthcare  provider about starting a supplement of vitamin B6.  Have fluids that are cold, clear, and carbonated or sour. Examples include lemonade, ginger ale, lemon-lime soda, ice water, and sparkling water.  Try lemon or mint tea.  Try brushing your teeth or using a mouth rinse after meals. What should I avoid to reduce my symptoms? Avoiding some of the following things may help reduce your symptoms.  Foods with strong smells. Try eating meals in well-ventilated areas that are free of odors.  Drinking water or other beverages with meals. Try not to drink anything during the 30 minutes before and after your meals.  Drinking more than 1 cup of fluid at a time. Sometimes using a straw helps.  Fried or high-fat foods, such as butter and cream sauces.  Spicy foods.  Skipping meals as best as you can. Nausea can be more intense on an empty stomach. If you cannot tolerate food at that time, do not force it. Try sucking on ice chips or other frozen items, and make up for missed calories later.  Lying down within 2 hours after eating.  Environmental triggers. These may include smoky rooms, closed spaces, rooms with strong smells, warm or humid places, overly loud and noisy rooms, and rooms with motion or flickering lights.  Quick and sudden changes in your movement. This information is not intended to replace advice given to you by your health care provider. Make sure you discuss any questions you have with your health care provider. Document Released: 02/25/2007 Document Revised: 12/28/2015 Document Reviewed: 11/29/2015 Elsevier Interactive Patient Education  2017 Elsevier Inc. Morning Sickness Morning  sickness is when you feel sick to your stomach (nauseous) during pregnancy. You may feel sick to your stomach and throw up (vomit). You may feel sick in the morning, but you can feel this way any time of day. Some women feel very sick to their stomach and cannot stop throwing up (hyperemesis  gravidarum). Follow these instructions at home:  Only take medicines as told by your doctor.  Take multivitamins as told by your doctor. Taking multivitamins before getting pregnant can stop or lessen the harshness of morning sickness.  Eat dry toast or unsalted crackers before getting out of bed.  Eat 5 to 6 small meals a day.  Eat dry and bland foods like rice and baked potatoes.  Do not drink liquids with meals. Drink between meals.  Do not eat greasy, fatty, or spicy foods.  Have someone cook for you if the smell of food causes you to feel sick or throw up.  If you feel sick to your stomach after taking prenatal vitamins, take them at night or with a snack.  Eat protein when you need a snack (nuts, yogurt, cheese).  Eat unsweetened gelatins for dessert.  Wear a bracelet used for sea sickness (acupressure wristband).  Go to a doctor that puts thin needles into certain body points (acupuncture) to improve how you feel.  Do not smoke.  Use a humidifier to keep the air in your house free of odors.  Get lots of fresh air. Contact a doctor if:  You need medicine to feel better.  You feel dizzy or lightheaded.  You are losing weight. Get help right away if:  You feel very sick to your stomach and cannot stop throwing up.  You pass out (faint). This information is not intended to replace advice given to you by your health care provider. Make sure you discuss any questions you have with your health care provider. Document Released: 06/07/2004 Document Revised: 10/06/2015 Document Reviewed: 10/15/2012 Elsevier Interactive Patient Education  2017 [employer]

## 2016-05-02 NOTE — MAU Provider Note (Signed)
History     CSN: [id number]  Arrival date and time: 05/01/16 2314   First Provider Initiated Contact with Patient 05/02/16 0006      No chief complaint on file.  HPI [name] is a 25 y.o. G1P0 at 2173w2d who presents to MAU today with complaint of N/V and abdominal pain x 1 week. The patient denies diarrhea, fever, UTI symptoms, vaginal bleeding or discharge. She rates her pain at 7/10. She states it is sharp and comes and goes. She tried Tylenol at 10 am yesterday without relief. LMP 03/12/16.   OB History    Gravida Para Term Preterm AB Living   2 0 0 0 1 0   SAB TAB Ectopic Multiple Live Births   1 0 0 0 0      Past Medical History:  Diagnosis Date  . Asthma   . IBS (irritable bowel syndrome)     Past Surgical History:  Procedure Laterality Date  . NO PAST SURGERIES      History reviewed. No pertinent family history.  Social History  Substance Use Topics  . Smoking status: Current Every Day Smoker    Types: Cigarettes    Last attempt to quit: 05/15/2015  . Smokeless tobacco: Never Used  . Alcohol use No     Comment: occ    Allergies: No Known Allergies  Prescriptions Prior to Admission  Medication Sig Dispense Refill Last Dose  . acetaminophen (TYLENOL) 325 MG tablet Take 650 mg by mouth every 6 (six) hours as needed.   05/01/2016 at 1000  . albuterol (PROVENTIL HFA;VENTOLIN HFA) 108 (90 Base) MCG/ACT inhaler Inhale 1-2 puffs into the lungs every 6 (six) hours as needed for wheezing or shortness of breath.   Past Month at Unknown time  . docusate sodium (COLACE) 100 MG capsule Take 100 mg by mouth 2 (two) times daily as needed for mild constipation.   More than a month at Unknown time  . ibuprofen (ADVIL,MOTRIN) 800 MG tablet Take 1 tablet (800 mg total) by mouth every 8 (eight) hours as needed for mild pain. 30 tablet 0   . predniSONE (DELTASONE) 20 MG tablet Take 2 tablets (40 mg total) by mouth daily. 10 tablet 0 Unknown at Unknown time  .  [DISCONTINUED] promethazine (PHENERGAN) 25 MG tablet Take 1 tablet (25 mg total) by mouth every 6 (six) hours as needed for nausea or vomiting. 15 tablet 0     Review of Systems  Constitutional: Negative for fever and malaise/fatigue.  Gastrointestinal: Positive for abdominal pain, nausea and vomiting. Negative for constipation and diarrhea.  Genitourinary: Negative for dysuria, frequency and urgency.       Neg - vaginal bleeding, discharge   Physical Exam   Blood pressure 119/66, pulse 86, temperature 99 F (37.2 C), temperature source Oral, resp. rate 17, height 5\' 5"  (1.651 m), weight 127 lb (57.6 kg), last menstrual period 03/12/2016, SpO2 99 %.  Physical Exam  Nursing note and vitals reviewed. Constitutional: She is oriented to person, place, and time. She appears well-developed and well-nourished. No distress.  HENT:  Head: Normocephalic and atraumatic.  Cardiovascular: Normal rate.   Respiratory: Effort normal.  GI: Soft. She exhibits no distension and no mass. There is tenderness (mild tenderness to palpation of the LLQ). There is no rebound and no guarding.  Neurological: She is alert and oriented to person, place, and time.  Skin: Skin is warm and dry. No erythema.  Psychiatric: She has a normal mood  and affect.    Results for orders placed or performed during the hospital encounter of 05/01/16 (from the past 24 hour(s))  Urinalysis, Routine w reflex microscopic     Status: Abnormal   Collection Time: 05/01/16 11:37 PM  Result Value Ref Range   Color, Urine STRAW (A) YELLOW   APPearance CLEAR CLEAR   Specific Gravity, Urine 1.010 1.005 - 1.030   pH 7.0 5.0 - 8.0   Glucose, UA NEGATIVE NEGATIVE mg/dL   Hgb urine dipstick NEGATIVE NEGATIVE   Bilirubin Urine NEGATIVE NEGATIVE   Ketones, ur NEGATIVE NEGATIVE mg/dL   Protein, ur NEGATIVE NEGATIVE mg/dL   Nitrite NEGATIVE NEGATIVE   Leukocytes, UA NEGATIVE NEGATIVE  Pregnancy, urine POC     Status: Abnormal   Collection  Time: 05/01/16 11:45 PM  Result Value Ref Range   Preg Test, Ur POSITIVE (A) NEGATIVE  Wet prep, genital     Status: Abnormal   Collection Time: 05/02/16 12:15 AM  Result Value Ref Range   Yeast Wet Prep HPF POC NONE SEEN NONE SEEN   Trich, Wet Prep NONE SEEN NONE SEEN   Clue Cells Wet Prep HPF POC NONE SEEN NONE SEEN   WBC, Wet Prep HPF POC FEW (A) NONE SEEN   Sperm NONE SEEN   CBC with Differential/Platelet     Status: Abnormal   Collection Time: 05/02/16 12:27 AM  Result Value Ref Range   WBC 10.4 4.0 - 10.5 K/uL   RBC 3.97 3.87 - 5.11 MIL/uL   Hemoglobin 12.5 12.0 - 15.0 g/dL   HCT 81.1 (L) 91.4 - 78.2 %   MCV 87.7 78.0 - 100.0 fL   MCH 31.5 26.0 - 34.0 pg   MCHC 35.9 30.0 - 36.0 g/dL   RDW 95.6 21.3 - 08.6 %   Platelets 215 150 - 400 K/uL   Neutrophils Relative % 75 %   Neutro Abs 8.0 (H) 1.7 - 7.7 K/uL   Lymphocytes Relative 20 %   Lymphs Abs 2.0 0.7 - 4.0 K/uL   Monocytes Relative 3 %   Monocytes Absolute 0.3 0.1 - 1.0 K/uL   Eosinophils Relative 1 %   Eosinophils Absolute 0.1 0.0 - 0.7 K/uL   Basophils Relative 1 %   Basophils Absolute 0.1 0.0 - 0.1 K/uL   US Ob Comp Less 14 Wks  Result Date: 05/02/2016 CLINICAL DATA:  25 year old female with left lower quadrant abdominal pain and vomiting. EXAM: OBSTETRIC <14 WK [place] AND TRANSVAGINAL OB US TECHNIQUE: Both transabdominal and transvaginal ultrasound examinations were performed for complete evaluation of the gestation as well as the maternal uterus, adnexal regions, and pelvic cul-de-sac. Transvaginal technique was performed to assess early pregnancy. COMPARISON:  Pelvic ultrasound dated 01/12/2016 FINDINGS: Intrauterine gestational sac: Single intrauterine gestational sac. Yolk sac:  Seen Embryo:  Present Cardiac Activity: Detected Heart Rate: 124  bpm CRL:  10  mm   7 w   0 d                  [place] EDC: 12/19/2016 Subchorionic hemorrhage:  There is a small subchorionic hemorrhage. Maternal uterus/adnexae: The ovaries appear  unremarkable. IMPRESSION: Single live intrauterine pregnancy with an estimated gestational age of [redacted] weeks, 0 days. Small subchorionic hemorrhage.  Follow-up recommended. Electronically Signed   By: Elgie Collard M.D.   On: 05/02/2016 01:16   US Ob Transvaginal  Result Date: 05/02/2016 CLINICAL DATA:  25 year old female with left lower quadrant abdominal pain and vomiting. EXAM: OBSTETRIC <14 WK  US AND TRANSVAGINAL OB US TECHNIQUE: Both transabdominal and transvaginal ultrasound examinations were performed for complete evaluation of the gestation as well as the maternal uterus, adnexal regions, and pelvic cul-de-sac. Transvaginal technique was performed to assess early pregnancy. COMPARISON:  Pelvic ultrasound dated 01/12/2016 FINDINGS: Intrauterine gestational sac: Single intrauterine gestational sac. Yolk sac:  Seen Embryo:  Present Cardiac Activity: Detected Heart Rate: 124  bpm CRL:  10  mm   7 w   0 d                  US EDC: 12/19/2016 Subchorionic hemorrhage:  There is a small subchorionic hemorrhage. Maternal uterus/adnexae: The ovaries appear unremarkable. IMPRESSION: Single live intrauterine pregnancy with an estimated gestational age of [redacted] weeks, 0 days. Small subchorionic hemorrhage.  Follow-up recommended. Electronically Signed   By: Elgie CollardArash  Radparvar M.D.   On: 05/02/2016 01:16    MAU Course  Procedures None  MDM +UPT UA, wet prep, GC/chlamydia, CBC, quant hCG, HIV, RPR and US today to rule out ectopic pregnancy O+ blood type  UA without evidence of dehydration today. Will trial medication at home.  Assessment and Plan  A: SIUP at 663w2d Abdominal pain in pregnancy, first trimester Nausea and vomiting in pregnancy prior to [redacted] weeks gestation  Small subchorionic hemorrhage   P: Discharge home Rx for Phenergan given to patient  First trimester precautions and diet for N/V discussed Patient advised to follow-up with OB provider of choice Pregnancy confirmation letter and list of  area OB providers given  Patient may return to MAU as needed or if her condition were to change or worsen  Marny LowensteinJulie N Shawan Tosh, PA-C  05/02/2016, 1:22 AM

## 2016-05-03 LAB — GC/CHLAMYDIA PROBE AMP (~~LOC~~) NOT AT ARMC
Chlamydia: POSITIVE — AB
NEISSERIA GONORRHEA: NEGATIVE

## 2016-05-04 ENCOUNTER — Encounter (HOSPITAL_COMMUNITY): Payer: Self-pay | Admitting: Advanced Practice Midwife

## 2016-05-04 DIAGNOSIS — A749 Chlamydial infection, unspecified: Secondary | ICD-10-CM | POA: Insufficient documentation

## 2016-05-08 ENCOUNTER — Other Ambulatory Visit: Payer: Self-pay | Admitting: Student

## 2016-05-08 ENCOUNTER — Telehealth (HOSPITAL_COMMUNITY): Payer: Self-pay | Admitting: *Deleted

## 2016-05-08 DIAGNOSIS — A749 Chlamydial infection, unspecified: Secondary | ICD-10-CM

## 2016-05-08 MED ORDER — AZITHROMYCIN 500 MG PO TABS: 1000.0000 mg | ORAL_TABLET | Freq: Once | ORAL | 0 refills | Status: AC

## 2016-05-08 NOTE — Telephone Encounter (Signed)
Notified pt. of positive Chlamyda results from 05/02/16. Prescription called to [facility] 1 gm Azithromycin po x 1 dose. Discussed with pt. To refrain from sexual intercourse x 2 weeks. [facility]

## 2016-05-14 NOTE — L&D Delivery Note (Signed)
Delivery Note At 12:21 AM a viable female was delivered via  (Presentation: ;  ).  APGAR:7 , 9; weight pending  .   Placenta status: intact , .  Cord:  3 vessel cord.   Anesthesia:  epidural  Lacerations:  None  Est. Blood Loss (mL):  50ml  Mom to postpartum.  Baby to Couplet care / Skin to Skin.  Audrey Pearson 12/20/2016, 12:35 AM  Patient is a G2P0010 at 5275w1d who was admitted with PROM with some mildly elevated BPs around the time of admission but otherwise uncomplicated prenatal course.  She progressed with augmentation via foley, cytotec and Pit.  I was gloved and present for delivery in its entirety.  Second stage of labor progressed to SVD.  Mild decels during second stage noted.  Complications: none  Lacerations: none (L labial abrasion and superficial vag sulcus tear- not repaired)  EBL: 50cc  SW to follow pp due to no family support  Cam HaiSHAW, KIMBERLY, CNM 12:45 AM 12/20/2016

## 2016-05-18 ENCOUNTER — Inpatient Hospital Stay (HOSPITAL_COMMUNITY)
Admission: AD | Admit: 2016-05-18 | Discharge: 2016-05-18 | Disposition: A | Discharge status: Home / Self Care | Payer: Medicaid Other | Source: Ambulatory Visit | Attending: Obstetrics & Gynecology | Admitting: Obstetrics & Gynecology

## 2016-05-18 ENCOUNTER — Encounter (HOSPITAL_COMMUNITY): Payer: Self-pay

## 2016-05-18 DIAGNOSIS — K5229 Other allergic and dietetic gastroenteritis and colitis: Secondary | ICD-10-CM | POA: Insufficient documentation

## 2016-05-18 DIAGNOSIS — F17210 Nicotine dependence, cigarettes, uncomplicated: Secondary | ICD-10-CM | POA: Diagnosis not present

## 2016-05-18 DIAGNOSIS — O99612 Diseases of the digestive system complicating pregnancy, second trimester: Secondary | ICD-10-CM | POA: Insufficient documentation

## 2016-05-18 DIAGNOSIS — O99332 Smoking (tobacco) complicating pregnancy, second trimester: Secondary | ICD-10-CM | POA: Insufficient documentation

## 2016-05-18 DIAGNOSIS — Z3A09 9 weeks gestation of pregnancy: Secondary | ICD-10-CM | POA: Diagnosis not present

## 2016-05-18 LAB — URINALYSIS, ROUTINE W REFLEX MICROSCOPIC
BILIRUBIN URINE: NEGATIVE
Glucose, UA: NEGATIVE mg/dL
Hgb urine dipstick: NEGATIVE
KETONES UR: 80 mg/dL — AB
Leukocytes, UA: NEGATIVE
NITRITE: NEGATIVE
Protein, ur: NEGATIVE mg/dL
Specific Gravity, Urine: 1.024 (ref 1.005–1.030)
pH: 5 (ref 5.0–8.0)

## 2016-05-18 MED ORDER — PROMETHAZINE HCL 25 MG PO TABS: 12.5000 mg | ORAL_TABLET | Freq: Once | ORAL | Status: DC

## 2016-05-18 MED ORDER — LACTATED RINGERS IV BOLUS (SEPSIS)
1000.0000 mL | Freq: Once | INTRAVENOUS | Status: AC
  Administered 2016-05-18: 1000 mL via INTRAVENOUS

## 2016-05-18 MED ORDER — PROMETHAZINE HCL 25 MG/ML IJ SOLN
12.5000 mg | Freq: Once | INTRAMUSCULAR | Status: AC
  Administered 2016-05-18: 12.5 mg via INTRAVENOUS
  Filled 2016-05-18: qty 1

## 2016-05-18 MED ORDER — PROMETHAZINE HCL 25 MG RE SUPP: 25.0000 mg | Freq: Four times a day (QID) | RECTAL | 0 refills | Status: DC | PRN

## 2016-05-18 NOTE — MAU Provider Note (Signed)
Chief Complaint: Nausea, vomiting, diarrhea  SUBJECTIVE HPI: Audrey Pearson is a 26 y.o. G2P0010 at [redacted]w[redacted]d who presents to [facility] reporting nausea, vomiting and diarrhea.  Began last night around 10pm, started with vomiting then diarrhea. Has vomited too many times to count, 5 times since here (bile). Having diarrhea, three times since been here in MAU. Diarrhea is tan colored, watery/loose. Last night had hamburger, self cooked, well done. Brother had stomach flu two days ago, she does not live with but visits daily with, and had dinner last night. Tried crackers, gingerale, water, but everything is causing her to throw up. She tried phenergan but threw it up immediately.  Has known IBS-constipation. This is very different from her usual IBS symptoms.    Past Medical History:  Diagnosis Date  . Asthma   . IBS (irritable bowel syndrome)    OB History  Gravida Para Term Preterm AB Living  2 0 0 0 1 0  SAB TAB Ectopic Multiple Live Births  1 0 0 0 0    # Outcome Date GA Lbr Len/2nd Weight Sex Delivery Anes PTL Lv  2 Current           1 SAB 06/2014             Past Surgical History:  Procedure Laterality Date  . NO PAST SURGERIES     Social History   Social History  . Marital status: Single    Spouse name: N/A  . Number of children: N/A  . Years of education: N/A   Occupational History  . Not on file.   Social History Main Topics  . Smoking status: Current Every Day Smoker    Types: Cigarettes    Last attempt to quit: 05/15/2015  . Smokeless tobacco: Never Used  . Alcohol use No     Comment: occ  . Drug use: No  . Sexual activity: Yes    Birth control/ protection: None   Other Topics Concern  . Not on file   Social History Narrative  . No narrative on file   No current facility-administered medications on file prior to encounter.    Current Outpatient Prescriptions on File Prior to Encounter  Medication Sig Dispense Refill  . promethazine (PHENERGAN)  25 MG tablet Take 1 tablet (25 mg total) by mouth every 6 (six) hours as needed for nausea or vomiting. 15 tablet 0  . ibuprofen (ADVIL,MOTRIN) 800 MG tablet Take 1 tablet (800 mg total) by mouth every 8 (eight) hours as needed for mild pain. (Patient not taking: Reported on 05/18/2016) 30 tablet 0  . predniSONE (DELTASONE) 20 MG tablet Take 2 tablets (40 mg total) by mouth daily. (Patient not taking: Reported on 05/18/2016) 10 tablet 0   No Known Allergies  I have reviewed the past Medical Hx, Surgical Hx, Social Hx, Allergies and Medications.   REVIEW OF SYSTEMS  A comprehensive ROS was negative except per HPI.    OBJECTIVE Patient Vitals for the past 24 hrs:  BP Temp Temp src Pulse Resp Weight  05/18/16 1032 115/65 98.5 F (36.9 C) Oral 100 18 126 lb 8 oz (57.4 kg)    PHYSICAL EXAM Constitutional: Well-developed, well-nourished female in no acute distress but looks ill.  Cardiovascular: normal rate, rhythm, no murmurs Respiratory: normal rate and effort. CTAB GI: Abd soft, non-tender when palpated, non-distended. Pos BS x 4, hyperactive MS: Extremities nontender, no edema, normal ROM Neurologic: Alert and oriented x 4.  GU: Neg CVAT.  LAB RESULTS Results for orders placed or performed during the hospital encounter of 05/18/16 (from the past 24 hour(s))  Urinalysis, Routine w reflex microscopic     Status: Abnormal   Collection Time: 05/18/16 10:35 AM  Result Value Ref Range   Color, Urine YELLOW YELLOW   APPearance HAZY (A) CLEAR   Specific Gravity, Urine 1.024 1.005 - 1.030   pH 5.0 5.0 - 8.0   Glucose, UA NEGATIVE NEGATIVE mg/dL   Hgb urine dipstick NEGATIVE NEGATIVE   Bilirubin Urine NEGATIVE NEGATIVE   Ketones, ur 80 (A) NEGATIVE mg/dL   Protein, ur NEGATIVE NEGATIVE mg/dL   Nitrite NEGATIVE NEGATIVE   Leukocytes, UA NEGATIVE NEGATIVE    IMAGING [place] Ob Comp Less 14 Wks  Result Date: 05/02/2016 CLINICAL DATA:  26 year old female with left lower quadrant abdominal  pain and vomiting. EXAM: OBSTETRIC <14 WK US AND TRANSVAGINAL OB US TECHNIQUE: Both transabdominal and transvaginal ultrasound examinations were performed for complete evaluation of the gestation as well as the maternal uterus, adnexal regions, and pelvic cul-de-sac. Transvaginal technique was performed to assess early pregnancy. COMPARISON:  Pelvic ultrasound dated 01/12/2016 FINDINGS: Intrauterine gestational sac: Single intrauterine gestational sac. Yolk sac:  Seen Embryo:  Present Cardiac Activity: Detected Heart Rate: 124  bpm CRL:  10  mm   7 w   0 d                  US EDC: 12/19/2016 Subchorionic hemorrhage:  There is a small subchorionic hemorrhage. Maternal uterus/adnexae: The ovaries appear unremarkable. IMPRESSION: Single live intrauterine pregnancy with an estimated gestational age of [redacted] weeks, 0 days. Small subchorionic hemorrhage.  Follow-up recommended. Electronically Signed   By: Elgie CollardArash  Radparvar M.D.   On: 05/02/2016 01:16   [place] Ob Transvaginal  Result Date: 05/02/2016 CLINICAL DATA:  26 year old female with left lower quadrant abdominal pain and vomiting. EXAM: OBSTETRIC <14 WK US AND TRANSVAGINAL OB US TECHNIQUE: Both transabdominal and transvaginal ultrasound examinations were performed for complete evaluation of the gestation as well as the maternal uterus, adnexal regions, and pelvic cul-de-sac. Transvaginal technique was performed to assess early pregnancy. COMPARISON:  Pelvic ultrasound dated 01/12/2016 FINDINGS: Intrauterine gestational sac: Single intrauterine gestational sac. Yolk sac:  Seen Embryo:  Present Cardiac Activity: Detected Heart Rate: 124  bpm CRL:  10  mm   7 w   0 d                  US EDC: 12/19/2016 Subchorionic hemorrhage:  There is a small subchorionic hemorrhage. Maternal uterus/adnexae: The ovaries appear unremarkable. IMPRESSION: Single live intrauterine pregnancy with an estimated gestational age of [redacted] weeks, 0 days. Small subchorionic hemorrhage.  Follow-up  recommended. Electronically Signed   By: Elgie CollardArash  Radparvar M.D.   On: 05/02/2016 01:16    MAU COURSE IVF bolus IV phenergan  12:55 PM - Reassessed, patient is able to hold down crackers and soda. Feels improved, wants to go home. Feels comfortable with using phenergan suppositories if unable to take PO phenergan (has already at home).   MDM Plan of care reviewed with patient, including labs and tests ordered and medical treatment.   ASSESSMENT 1. Other dietetic gastroenteritis     PLAN Discharge home in stable condition. Phenergan suppositories if cannot tolerate PO Supportive care, increase hydration Bland diet    Allergies as of 05/18/2016   No Known Allergies     Medication List    STOP taking these medications   ibuprofen 800 MG tablet  Commonly known as:  ADVIL,MOTRIN   predniSONE 20 MG tablet Commonly known as:  DELTASONE     TAKE these medications   prenatal multivitamin Tabs tablet Take 1 tablet by mouth daily at 12 noon.   promethazine 25 MG tablet Commonly known as:  PHENERGAN Take 1 tablet (25 mg total) by mouth every 6 (six) hours as needed for nausea or vomiting. What changed:  Another medication with the same name was added. Make sure you understand how and when to take each.   promethazine 25 MG suppository Commonly known as:  PHENERGAN Place 1 suppository (25 mg total) rectally every 6 (six) hours as needed for nausea or vomiting. If cannot tolerate oral intake. What changed:  You were already taking a medication with the same name, and this prescription was added. Make sure you understand how and when to take each.        Jen Mow, DO OB Fellow 05/18/2016 11:45 AM

## 2016-05-18 NOTE — Discharge Instructions (Signed)

## 2016-05-18 NOTE — MAU Note (Signed)
Last night around 1000, started throwing up, continued that and then the diarrhea started.   abd is really sore, cramping. Not able to keep anything down.

## 2016-06-08 ENCOUNTER — Other Ambulatory Visit (HOSPITAL_COMMUNITY)
Admission: RE | Admit: 2016-06-08 | Discharge: 2016-06-08 | Disposition: A | Discharge status: Home / Self Care | Payer: Medicaid Other | Source: Ambulatory Visit | Attending: Certified Nurse Midwife | Admitting: Certified Nurse Midwife

## 2016-06-08 ENCOUNTER — Encounter: Payer: Self-pay | Admitting: Certified Nurse Midwife

## 2016-06-08 ENCOUNTER — Ambulatory Visit (INDEPENDENT_AMBULATORY_CARE_PROVIDER_SITE_OTHER): Payer: Medicaid Other | Admitting: Certified Nurse Midwife

## 2016-06-08 VITALS — BP 121/72 | HR 94 | Temp 100.1°F | Wt 129.5 lb

## 2016-06-08 DIAGNOSIS — Z113 Encounter for screening for infections with a predominantly sexual mode of transmission: Secondary | ICD-10-CM | POA: Insufficient documentation

## 2016-06-08 DIAGNOSIS — J111 Influenza due to unidentified influenza virus with other respiratory manifestations: Secondary | ICD-10-CM

## 2016-06-08 DIAGNOSIS — O219 Vomiting of pregnancy, unspecified: Secondary | ICD-10-CM

## 2016-06-08 DIAGNOSIS — Z3490 Encounter for supervision of normal pregnancy, unspecified, unspecified trimester: Secondary | ICD-10-CM | POA: Diagnosis not present

## 2016-06-08 DIAGNOSIS — Z3493 Encounter for supervision of normal pregnancy, unspecified, third trimester: Secondary | ICD-10-CM

## 2016-06-08 DIAGNOSIS — A749 Chlamydial infection, unspecified: Secondary | ICD-10-CM

## 2016-06-08 DIAGNOSIS — J4520 Mild intermittent asthma, uncomplicated: Secondary | ICD-10-CM

## 2016-06-08 LAB — INFLUENZA A AND B
INFLUENZA A AG, EIA: POSITIVE — AB
Influenza B Ag, EIA: NEGATIVE

## 2016-06-08 LAB — POCT RAPID STREP A (OFFICE): Rapid Strep A Screen: NEGATIVE

## 2016-06-08 MED ORDER — OSELTAMIVIR PHOSPHATE 75 MG PO CAPS: 75.0000 mg | ORAL_CAPSULE | Freq: Two times a day (BID) | ORAL | 0 refills | Status: DC

## 2016-06-08 MED ORDER — PRENATE PIXIE 10-0.6-0.4-200 MG PO CAPS: 1.0000 | ORAL_CAPSULE | Freq: Every day | ORAL | 12 refills | Status: DC

## 2016-06-08 MED ORDER — DOXYLAMINE-PYRIDOXINE 10-10 MG PO TBEC: DELAYED_RELEASE_TABLET | ORAL | 4 refills | Status: DC

## 2016-06-08 MED ORDER — ONDANSETRON HCL 8 MG PO TABS: 8.0000 mg | ORAL_TABLET | Freq: Three times a day (TID) | ORAL | 2 refills | Status: DC | PRN

## 2016-06-10 LAB — CULTURE, OB URINE

## 2016-06-10 LAB — URINE CULTURE, OB REFLEX

## 2016-06-12 LAB — CERVICOVAGINAL ANCILLARY ONLY
BACTERIAL VAGINITIS: NEGATIVE
CANDIDA VAGINITIS: POSITIVE — AB
Chlamydia: NEGATIVE
NEISSERIA GONORRHEA: NEGATIVE
TRICH (WINDOWPATH): NEGATIVE

## 2016-06-13 ENCOUNTER — Telehealth: Payer: Self-pay | Admitting: Family

## 2016-06-13 ENCOUNTER — Encounter: Payer: Self-pay | Admitting: Certified Nurse Midwife

## 2016-06-13 ENCOUNTER — Other Ambulatory Visit: Payer: Self-pay | Admitting: Family

## 2016-06-13 DIAGNOSIS — J4520 Mild intermittent asthma, uncomplicated: Secondary | ICD-10-CM

## 2016-06-13 DIAGNOSIS — O219 Vomiting of pregnancy, unspecified: Secondary | ICD-10-CM | POA: Insufficient documentation

## 2016-06-13 HISTORY — DX: Mild intermittent asthma, uncomplicated: J45.20

## 2016-06-13 MED ORDER — TERCONAZOLE 0.4 % VA CREA
1.0000 | TOPICAL_CREAM | Freq: Every day | VAGINAL | 0 refills | Status: DC
Start: 2016-06-13 — End: 2016-07-06

## 2016-06-13 MED ORDER — ALBUTEROL SULFATE HFA 108 (90 BASE) MCG/ACT IN AERS: 2.0000 | INHALATION_SPRAY | Freq: Four times a day (QID) | RESPIRATORY_TRACT | 1 refills | Status: DC | PRN

## 2016-06-13 NOTE — Progress Notes (Signed)
Subjective:    Audrey Pearson is being seen today for her first obstetrical visit.  This is a planned pregnancy. She is at 105w0d gestation. Her obstetrical history is significant for asthma. Relationship with FOB: significant other, living together. Patient does intend to breast feed. Pregnancy history fully reviewed. + nausea and vomiting, reported. + hx of asthma, does not have a current inhaler.  Not currently employed.  Reports fever since last week.  Highest fever of 104.  Reports chills, rhinorrhea, cough, congestion.  Has not been anywhere for evaluation.    The information documented in the HPI was reviewed and verified.  Menstrual History: OB History    Gravida Para Term Preterm AB Living   2 0 0 0 1 0   SAB TAB Ectopic Multiple Live Births   1 0 0 0 0       Patient's last menstrual period was 03/12/2016.    Past Medical History:  Diagnosis Date  . Asthma   . IBS (irritable bowel syndrome)     Past Surgical History:  Procedure Laterality Date  . NO PAST SURGERIES       (Not in a hospital admission) No Known Allergies  Social History  Substance Use Topics  . Smoking status: Current Every Day Smoker    Types: Cigarettes    Last attempt to quit: 05/15/2015  . Smokeless tobacco: Never Used  . Alcohol use No     Comment: occ    Family History  Problem Relation Age of Onset  . Adopted: Yes  . Breast cancer Mother      Review of Systems Constitutional: negative for weight loss Gastrointestinal: negative for vomiting Genitourinary:negative for genital lesions and vaginal discharge and dysuria Musculoskeletal:negative for back pain Behavioral/Psych: negative for abusive relationship, depression, illegal drug usage and tobacco use    Objective:    BP 121/72   Pulse 94   Temp 100.1 F (37.8 C)   Wt 129 lb 8 oz (58.7 kg)   LMP 03/12/2016   BMI 21.55 kg/m  General Appearance:    Alert, cooperative, no distress, appears stated age  Head:    Normocephalic, without  obvious abnormality, atraumatic  Eyes:    PERRL, conjunctiva/corneas clear, EOM's intact, fundi    benign, both eyes  Ears:    Normal TM's and external ear canals, both ears  Nose:   Nares normal, septum midline, mucosa normal, no drainage    or sinus tenderness  Throat:   Lips, mucosa, and tongue normal; teeth and gums normal  Neck:   Supple, symmetrical, trachea midline, no adenopathy;    thyroid:  no enlargement/tenderness/nodules; no carotid   bruit or JVD  Back:     Symmetric, no curvature, ROM normal, no CVA tenderness  Lungs:     Clear to auscultation bilaterally, respirations unlabored  Chest Wall:    No tenderness or deformity   Heart:    Regular rate and rhythm, S1 and S2 normal, no murmur, rub   or gallop  Breast Exam:    No tenderness, masses, or nipple abnormality  Abdomen:     Soft, non-tender, bowel sounds active all four quadrants,    no masses, no organomegaly  Genitalia:    Normal female without lesion, discharge or tenderness  Extremities:   Extremities normal, atraumatic, no cyanosis or edema  Pulses:   2+ and symmetric all extremities  Skin:   Skin color, texture, turgor normal, no rashes or lesions  Lymph nodes:   Cervical,  supraclavicular, and axillary nodes normal  Neurologic:   CNII-XII intact, normal strength, sensation and reflexes    throughout            Cervix: long, thick, closed and posterior.      Lab Review Urine pregnancy test Labs reviewed yes Radiologic studies reviewed yes Assessment:    Pregnancy at 158w0d weeks   FLU+  N&V in early pregnancy  Plan:     Tamiflu ordered. F/U precautions given.   Prenatal vitamins.  Counseling provided regarding continued use of seat belts, cessation of alcohol consumption, smoking or use of illicit drugs; infection precautions i.e., influenza/TDAP immunizations, toxoplasmosis,CMV, parvovirus, listeria and varicella; workplace safety, exercise during pregnancy; routine dental care, safe medications, sexual  activity, hot tubs, saunas, pools, travel, caffeine use, fish and methlymercury, potential toxins, hair treatments, varicose veins Weight gain recommendations per IOM guidelines reviewed: underweight/BMI< 18.5--> gain 28 - 40 lbs; normal weight/BMI 18.5 - 24.9--> gain 25 - 35 lbs; overweight/BMI 25 - 29.9--> gain 15 - 25 lbs; obese/BMI >30->gain  11 - 20 lbs Problem list reviewed and updated. FIRST/CF mutation testing/NIPT/QUAD SCREEN/fragile X/Ashkenazi Jewish population testing/Spinal muscular atrophy discussed: ordered. Role of ultrasound in pregnancy discussed; fetal survey: requested. Amniocentesis discussed: not indicated. VBAC calculator score: VBAC consent form provided Meds ordered this encounter  Medications  . ondansetron (ZOFRAN) 8 MG tablet    Sig: Take 1 tablet (8 mg total) by mouth every 8 (eight) hours as needed for nausea or vomiting.    Dispense:  40 tablet    Refill:  2  . Doxylamine-Pyridoxine (DICLEGIS) 10-10 MG TBEC    Sig: Take 1 tablet with breakfast and lunch.  Take 2 tablets at bedtime.    Dispense:  100 tablet    Refill:  4  . Prenat-FeAsp-Meth-FA-DHA w/o A (PRENATE PIXIE) 10-0.6-0.4-200 MG CAPS    Sig: Take 1 tablet by mouth daily.    Dispense:  30 capsule    Refill:  12    Please process coupon: Rx BIN: [id number], RxPCN: [id number], RxGRP: [id number]: [id number], Rx: [id number]: [id number]  SUF: 01  . oseltamivir (TAMIFLU) 75 MG capsule    Sig: Take 1 capsule (75 mg total) by mouth 2 (two) times daily.    Dispense:  10 capsule    Refill:  0   Orders Placed This Encounter  Procedures  . Culture, OB Urine  . Influenza a and b    Please call results to Orvilla Cornwallachelle Na Waldrip, CNM at [phone] with results.  . Result  . TSH  . Hemoglobinopathy evaluation  . Varicella zoster antibody, IgG  . MaterniT21 PLUS Core+SCA    Order Specific Question:   Is the patient insulin dependent?    Answer:   No    Order Specific Question:   Please enter gestational age. This should be expressed as weeks  AND days, i.e. 16w 6d. Enter weeks here. Enter days in next question.    Answer:   7912    Order Specific Question:   Please enter gestational age. This should be expressed as weeks AND days, i.e. 16w 6d. Enter days here. Enter weeks in previous question.    Answer:   2    Order Specific Question:   How was gestational age calculated?    Answer:   LMP    Order Specific Question:   Please give the date of LMP OR Ultrasound OR Estimated date of delivery.    Answer:   12/19/2016    Order Specific Question:   Number  of Fetuses (Type of Pregnancy):    Answer:   1    Order Specific Question:   Indications for performing the test? (please choose all that apply):    Answer:   Routine screening    Order Specific Question:   Other Indications? (Y=Yes, N=No)    Answer:   N    Order Specific Question:   If this is a repeat specimen, please indicate the reason:    Answer:   Not indicated    Order Specific Question:   Please specify the patient's race: (C=White/Caucasion, B=Black, I=Native American, A=Asian, H=Hispanic, O=Other, U=Unknown)    Answer:   U    Order Specific Question:   Donor Egg - indicate if the egg was obtained from in vitro fertilization.    Answer:   N    Order Specific Question:   Age of Egg Donor.    Answer:   42    Order Specific Question:   Prior Down Syndrome/ONTD screening during current pregnancy.    Answer:   N    Order Specific Question:   Prior First Trimester Testing    Answer:   N    Order Specific Question:   Prior Second Trimester Testing    Answer:   N    Order Specific Question:   Family History of Neural Tube Defects    Answer:   N    Order Specific Question:   Prior Pregnancy with Down Syndrome    Answer:   N    Order Specific Question:   Please give the patient's weight (in pounds)    Answer:   100  . ToxASSURE Select 13 (MW), Urine  . Hemoglobin A1c  . Obstetric Panel, Including HIV  . Cystic Fibrosis Mutation 97  . POCT rapid strep A    Follow up in 4  weeks. 50% of 30 min visit spent on counseling and coordination of care.

## 2016-06-13 NOTE — Telephone Encounter (Signed)
Left message with patient regardign +influenza A and to continue taking Tamiflu.  Pt informed to call office for remaining results (+yeast infection); RX for terazol sent to pharmacy.

## 2016-06-15 ENCOUNTER — Inpatient Hospital Stay (HOSPITAL_COMMUNITY)
Admission: AD | Admit: 2016-06-15 | Discharge: 2016-06-15 | Disposition: A | Discharge status: Home / Self Care | Payer: Medicaid Other | Source: Ambulatory Visit | Attending: Obstetrics and Gynecology | Admitting: Obstetrics and Gynecology

## 2016-06-15 ENCOUNTER — Inpatient Hospital Stay (HOSPITAL_COMMUNITY): Payer: Medicaid Other

## 2016-06-15 DIAGNOSIS — O209 Hemorrhage in early pregnancy, unspecified: Secondary | ICD-10-CM

## 2016-06-15 DIAGNOSIS — O99512 Diseases of the respiratory system complicating pregnancy, second trimester: Secondary | ICD-10-CM | POA: Diagnosis not present

## 2016-06-15 DIAGNOSIS — O99612 Diseases of the digestive system complicating pregnancy, second trimester: Secondary | ICD-10-CM | POA: Insufficient documentation

## 2016-06-15 DIAGNOSIS — Z3A13 13 weeks gestation of pregnancy: Secondary | ICD-10-CM | POA: Insufficient documentation

## 2016-06-15 DIAGNOSIS — O208 Other hemorrhage in early pregnancy: Secondary | ICD-10-CM | POA: Diagnosis not present

## 2016-06-15 DIAGNOSIS — O99332 Smoking (tobacco) complicating pregnancy, second trimester: Secondary | ICD-10-CM | POA: Diagnosis not present

## 2016-06-15 DIAGNOSIS — O468X1 Other antepartum hemorrhage, first trimester: Secondary | ICD-10-CM | POA: Diagnosis not present

## 2016-06-15 DIAGNOSIS — F17210 Nicotine dependence, cigarettes, uncomplicated: Secondary | ICD-10-CM | POA: Insufficient documentation

## 2016-06-15 DIAGNOSIS — J45909 Unspecified asthma, uncomplicated: Secondary | ICD-10-CM | POA: Insufficient documentation

## 2016-06-15 DIAGNOSIS — K589 Irritable bowel syndrome without diarrhea: Secondary | ICD-10-CM | POA: Insufficient documentation

## 2016-06-15 DIAGNOSIS — O418X10 Other specified disorders of amniotic fluid and membranes, first trimester, not applicable or unspecified: Secondary | ICD-10-CM

## 2016-06-15 LAB — OBSTETRIC PANEL, INCLUDING HIV
ANTIBODY SCREEN: NEGATIVE
BASOS ABS: 0 10*3/uL (ref 0.0–0.2)
BASOS: 0 %
EOS (ABSOLUTE): 0.1 10*3/uL (ref 0.0–0.4)
Eos: 1 %
HIV SCREEN 4TH GENERATION: NONREACTIVE
Hematocrit: 36.6 % (ref 34.0–46.6)
Hemoglobin: 12.5 g/dL (ref 11.1–15.9)
Hepatitis B Surface Ag: NEGATIVE
Immature Grans (Abs): 0 10*3/uL (ref 0.0–0.1)
Immature Granulocytes: 0 %
LYMPHS ABS: 1.7 10*3/uL (ref 0.7–3.1)
Lymphs: 18 %
MCH: 31.1 pg (ref 26.6–33.0)
MCHC: 34.2 g/dL (ref 31.5–35.7)
MCV: 91 fL (ref 79–97)
MONOCYTES: 4 %
MONOS ABS: 0.4 10*3/uL (ref 0.1–0.9)
NEUTROS ABS: 7.1 10*3/uL — AB (ref 1.4–7.0)
Neutrophils: 77 %
PLATELETS: 233 10*3/uL (ref 150–379)
RBC: 4.02 x10E6/uL (ref 3.77–5.28)
RDW: 13.2 % (ref 12.3–15.4)
RPR Ser Ql: NONREACTIVE
Rh Factor: POSITIVE
Rubella Antibodies, IGG: 2.51 index (ref >0.99)
WBC: 9.2 10*3/uL (ref 3.4–10.8)

## 2016-06-15 LAB — URINALYSIS, ROUTINE W REFLEX MICROSCOPIC
Bilirubin Urine: NEGATIVE
GLUCOSE, UA: NEGATIVE mg/dL
HGB URINE DIPSTICK: NEGATIVE
KETONES UR: NEGATIVE mg/dL
Leukocytes, UA: NEGATIVE
Nitrite: NEGATIVE
PH: 6 (ref 5.0–8.0)
PROTEIN: NEGATIVE mg/dL
Specific Gravity, Urine: 1.015 (ref 1.005–1.030)

## 2016-06-15 LAB — HEMOGLOBINOPATHY EVALUATION
HGB C: 0 %
HGB S: 39.6 % — ABNORMAL HIGH
Hemoglobin A2 Quantitation: 3.6 % — ABNORMAL HIGH (ref 1.8–3.2)
Hemoglobin F Quantitation: 0 % (ref 0.0–2.0)
Hgb A: 56.8 % — ABNORMAL LOW (ref 96.4–98.8)

## 2016-06-15 LAB — HEMOGLOBIN A1C
Est. average glucose Bld gHb Est-mCnc: 85 mg/dL
Hgb A1c MFr Bld: 4.6 % — ABNORMAL LOW (ref 4.8–5.6)

## 2016-06-15 LAB — CYSTIC FIBROSIS MUTATION 97: GENE DIS ANAL CARRIER INTERP BLD/T-IMP: NOT DETECTED

## 2016-06-15 LAB — TOXASSURE SELECT 13 (MW), URINE

## 2016-06-15 LAB — TSH: TSH: 1.65 u[IU]/mL (ref 0.450–4.500)

## 2016-06-15 LAB — VARICELLA ZOSTER ANTIBODY, IGG: VARICELLA: 206 {index} (ref >165)

## 2016-06-15 NOTE — Discharge Instructions (Signed)
Subchorionic Hematoma °A subchorionic hematoma is a gathering of blood between the outer wall of the placenta and the inner wall of the womb (uterus). The placenta is the organ that connects the fetus to the wall of the uterus. The placenta performs the feeding, breathing (oxygen to the fetus), and waste removal (excretory work) of the fetus.  °Subchorionic hematoma is the most common abnormality found on a result from ultrasonography done during the first trimester or early second trimester of pregnancy. If there has been little or no vaginal bleeding, early small hematomas usually shrink on their own and do not affect your baby or pregnancy. The blood is gradually absorbed over 1-2 weeks. When bleeding starts later in pregnancy or the hematoma is larger or occurs in an older pregnant woman, the outcome may not be as good. Larger hematomas may get bigger, which increases the chances for miscarriage. Subchorionic hematoma also increases the risk of premature detachment of the placenta from the uterus, preterm (premature) labor, and stillbirth. °HOME CARE INSTRUCTIONS °· Stay on bed rest if your health care provider recommends this. Although bed rest will not prevent more bleeding or prevent a miscarriage, your health care provider may recommend bed rest until you are advised otherwise. °· Avoid heavy lifting (more than 10 lb [4.5 kg]), exercise, sexual intercourse, or douching as directed by your health care provider. °· Keep track of the number of pads you use each day and how soaked (saturated) they are. Write down this information. °· Do not use tampons. °· Keep all follow-up appointments as directed by your health care provider. Your health care provider may ask you to have follow-up blood tests or ultrasound tests or both. °SEEK IMMEDIATE MEDICAL CARE IF: °· You have severe cramps in your stomach, back, abdomen, or pelvis. °· You have a fever. °· You pass large clots or tissue. Save any tissue for your health  care provider to look at. °· Your bleeding increases or you become lightheaded, feel weak, or have fainting episodes. °This information is not intended to replace advice given to you by your health care provider. Make sure you discuss any questions you have with your health care provider. °Document Released: 08/15/2006 Document Revised: 05/21/2014 Document Reviewed: 11/27/2012 °Elsevier Interactive Patient Education © 2017 [employer] ° °

## 2016-06-15 NOTE — MAU Provider Note (Signed)
History     CSN: [id number]  Arrival date and time: 06/15/16 1622   First Provider Initiated Contact with Patient 06/15/16 1900        Chief Complaint  Patient presents with  . Vaginal Bleeding   HPI  Audrey Pearson is a 26 y.o. G2P0010 at 7545w2d who presents with vaginal bleeding. Reports vaginal bleeding this morning. Saw bright red blood on toilet paper & 2 small bright red clots. Has not seen bleeding since then. Has some lower abdominal cramping but states that has been consistent throughout pregnancy and has not changed. Denies n/v/d, dysuria, vaginal discharge, or recent intercourse. Last BM was 2 days ago & has had issues with constipation d/t her IBS.   OB History    Gravida Para Term Preterm AB Living   2 0 0 0 1 0   SAB TAB Ectopic Multiple Live Births   1 0 0 0 0      Past Medical History:  Diagnosis Date  . Asthma   . IBS (irritable bowel syndrome)     Past Surgical History:  Procedure Laterality Date  . NO PAST SURGERIES      Family History  Problem Relation Age of Onset  . Adopted: Yes  . Breast cancer Mother     Social History  Substance Use Topics  . Smoking status: Current Every Day Smoker    Types: Cigarettes    Last attempt to quit: 05/15/2015  . Smokeless tobacco: Never Used  . Alcohol use No     Comment: occ    Allergies: No Known Allergies  Prescriptions Prior to Admission  Medication Sig Dispense Refill Last Dose  . acetaminophen (TYLENOL) 500 MG tablet Take 1,000 mg by mouth every 6 (six) hours as needed for moderate pain.   Past Week at Unknown time  . albuterol (PROVENTIL HFA;VENTOLIN HFA) 108 (90 Base) MCG/ACT inhaler Inhale 2 puffs into the lungs every 6 (six) hours as needed for wheezing or shortness of breath. 18 g 1 year at Unknown time  . ondansetron (ZOFRAN) 8 MG tablet Take 1 tablet (8 mg total) by mouth every 8 (eight) hours as needed for nausea or vomiting. 40 tablet 2 06/14/2016 at Unknown time  . Prenat-FeAsp-Meth-FA-DHA w/o A  (PRENATE PIXIE) 10-0.6-0.4-200 MG CAPS Take 1 tablet by mouth daily. 30 capsule 12 06/15/2016 at Unknown time  . terconazole (TERAZOL 7) 0.4 % vaginal cream Place 1 applicator vaginally at bedtime. 45 g 0 06/14/2016 at Unknown time  . Doxylamine-Pyridoxine (DICLEGIS) 10-10 MG TBEC Take 1 tablet with breakfast and lunch.  Take 2 tablets at bedtime. (Patient not taking: Reported on 06/15/2016) 100 tablet 4 Not Taking at Unknown time  . oseltamivir (TAMIFLU) 75 MG capsule Take 1 capsule (75 mg total) by mouth 2 (two) times daily. (Patient not taking: Reported on 06/15/2016) 10 capsule 0 Not Taking at Unknown time  . promethazine (PHENERGAN) 25 MG suppository Place 1 suppository (25 mg total) rectally every 6 (six) hours as needed for nausea or vomiting. If cannot tolerate oral intake. (Patient not taking: Reported on 06/08/2016) 12 each 0 Not Taking  . promethazine (PHENERGAN) 25 MG tablet Take 1 tablet (25 mg total) by mouth every 6 (six) hours as needed for nausea or vomiting. (Patient not taking: Reported on 06/08/2016) 15 tablet 0 Not Taking    Review of Systems  Constitutional: Negative.   Gastrointestinal: Positive for abdominal pain and constipation. Negative for diarrhea, nausea and vomiting.  Genitourinary: Positive for vaginal  bleeding. Negative for dysuria and vaginal discharge.   Physical Exam   Blood pressure 107/60, pulse 78, temperature 97.9 F (36.6 C), temperature source Oral, resp. rate 16, last menstrual period 03/12/2016.  Physical Exam  Nursing note and vitals reviewed. Constitutional: She is oriented to person, place, and time. She appears well-developed and well-nourished. No distress.  HENT:  Head: Normocephalic and atraumatic.  Eyes: Conjunctivae are normal. Right eye exhibits no discharge. Left eye exhibits no discharge. No scleral icterus.  Neck: Normal range of motion.  Respiratory: Effort normal. No respiratory distress.  GI: Soft. She exhibits no distension. There is no  tenderness.  Genitourinary: No bleeding in the vagina.  Genitourinary Comments: Cervix closed  Neurological: She is alert and oriented to person, place, and time.  Skin: Skin is warm and dry. She is not diaphoretic.  Psychiatric: She has a normal mood and affect. Her behavior is normal. Judgment and thought content normal.    MAU Course  Procedures Results for orders placed or performed during the hospital encounter of 06/15/16 (from the past 24 hour(s))  Urinalysis, Routine w reflex microscopic     Status: Abnormal   Collection Time: 06/15/16  4:40 PM  Result Value Ref Range   Color, Urine YELLOW YELLOW   APPearance HAZY (A) CLEAR   Specific Gravity, Urine 1.015 1.005 - 1.030   pH 6.0 5.0 - 8.0   Glucose, UA NEGATIVE NEGATIVE mg/dL   Hgb urine dipstick NEGATIVE NEGATIVE   Bilirubin Urine NEGATIVE NEGATIVE   Ketones, ur NEGATIVE NEGATIVE mg/dL   Protein, ur NEGATIVE NEGATIVE mg/dL   Nitrite NEGATIVE NEGATIVE   Leukocytes, UA NEGATIVE NEGATIVE   US Ob Comp Less 14 Wks  Result Date: 06/15/2016 CLINICAL DATA:  Vaginal bleeding and first-trimester pregnancy EXAM: OBSTETRIC <14 WK ULTRASOUND TECHNIQUE: Transabdominal ultrasound was performed for evaluation of the gestation as well as the maternal uterus and adnexal regions. COMPARISON:  05/02/2016 FINDINGS: Intrauterine gestational sac: Present Yolk sac:  No longer seen Embryo:  Present Cardiac Activity: Present Heart Rate: 148 bpm CRL:   71  Mm   13 w 2 d                  [place] EDC: 12/19/2016 Subchorionic hemorrhage: Present at the fundus measuring 44 x 9 mm, increased from prior Maternal uterus/adnexae: Negative IMPRESSION: 1. Subchorionic hematoma measuring 44 mm in length by 9 mm in thickness. 2. Normal cardiac activity.  Normal fetal growth since prior. Electronically Signed   By: Marnee Spring M.D.   On: 06/15/2016 19:42    MDM FHT 147 by doppler O positive Cervix closed Ultrasound shows [facility] that has grown since previous  ultrasound  Assessment and Plan  A: 1. Subchorionic hematoma in first trimester, single or unspecified fetus   2. Vaginal bleeding in pregnancy, first trimester    P: Discharge home Pelvic rest F/u with OB as scheduled [facility] added to problem list Discussed reasons to return to MAU  Judeth Horn 06/15/2016, 7:00 PM

## 2016-06-15 NOTE — MAU Note (Signed)
Pt states she woke up today @ 1100 with severe abd cramps, two hours later she started bleeding, passed two clots.  Called her provider, was told to come to MAU.  Tested positive for the flu recently - hasn't had fever or other sx's in several days.  Still having abd pain, is not as severe.  Only had the one episode of bleeding.

## 2016-06-16 ENCOUNTER — Encounter: Payer: Self-pay | Admitting: Family

## 2016-06-16 DIAGNOSIS — D573 Sickle-cell trait: Secondary | ICD-10-CM | POA: Insufficient documentation

## 2016-06-17 LAB — MATERNIT21 PLUS CORE+SCA
CHROMOSOME 13: NEGATIVE
CHROMOSOME 18: NEGATIVE
CHROMOSOME 21: NEGATIVE
Y Chromosome: NOT DETECTED

## 2016-06-18 ENCOUNTER — Other Ambulatory Visit: Payer: Self-pay | Admitting: Certified Nurse Midwife

## 2016-06-18 DIAGNOSIS — Z3480 Encounter for supervision of other normal pregnancy, unspecified trimester: Secondary | ICD-10-CM

## 2016-06-18 LAB — CYTOLOGY - PAP: DIAGNOSIS: NEGATIVE

## 2016-06-25 ENCOUNTER — Encounter (HOSPITAL_COMMUNITY): Payer: Self-pay | Admitting: *Deleted

## 2016-06-25 ENCOUNTER — Inpatient Hospital Stay (HOSPITAL_COMMUNITY)
Admission: AD | Admit: 2016-06-25 | Discharge: 2016-06-25 | Disposition: A | Discharge status: Home / Self Care | Payer: Medicaid Other | Source: Ambulatory Visit | Attending: Obstetrics and Gynecology | Admitting: Obstetrics and Gynecology

## 2016-06-25 DIAGNOSIS — O99332 Smoking (tobacco) complicating pregnancy, second trimester: Secondary | ICD-10-CM | POA: Insufficient documentation

## 2016-06-25 DIAGNOSIS — Z3A14 14 weeks gestation of pregnancy: Secondary | ICD-10-CM

## 2016-06-25 DIAGNOSIS — O210 Mild hyperemesis gravidarum: Secondary | ICD-10-CM | POA: Insufficient documentation

## 2016-06-25 DIAGNOSIS — O99612 Diseases of the digestive system complicating pregnancy, second trimester: Secondary | ICD-10-CM | POA: Diagnosis not present

## 2016-06-25 DIAGNOSIS — F17210 Nicotine dependence, cigarettes, uncomplicated: Secondary | ICD-10-CM | POA: Insufficient documentation

## 2016-06-25 DIAGNOSIS — R109 Unspecified abdominal pain: Secondary | ICD-10-CM | POA: Diagnosis present

## 2016-06-25 DIAGNOSIS — O219 Vomiting of pregnancy, unspecified: Secondary | ICD-10-CM | POA: Diagnosis not present

## 2016-06-25 DIAGNOSIS — K581 Irritable bowel syndrome with constipation: Secondary | ICD-10-CM | POA: Diagnosis not present

## 2016-06-25 LAB — URINALYSIS, ROUTINE W REFLEX MICROSCOPIC
Bilirubin Urine: NEGATIVE
Glucose, UA: NEGATIVE mg/dL
Hgb urine dipstick: NEGATIVE
Ketones, ur: NEGATIVE mg/dL
LEUKOCYTES UA: NEGATIVE
Nitrite: NEGATIVE
PROTEIN: NEGATIVE mg/dL
Specific Gravity, Urine: 1.011 (ref 1.005–1.030)
pH: 7 (ref 5.0–8.0)

## 2016-06-25 MED ORDER — POLYETHYLENE GLYCOL 3350 17 G PO PACK: 17.0000 g | PACK | Freq: Every day | ORAL | 0 refills | Status: DC | PRN

## 2016-06-25 MED ORDER — METOCLOPRAMIDE HCL 10 MG PO TABS
10.0000 mg | ORAL_TABLET | Freq: Once | ORAL | Status: AC
  Administered 2016-06-25: 10 mg via ORAL
  Filled 2016-06-25: qty 1

## 2016-06-25 MED ORDER — PROMETHAZINE HCL 25 MG RE SUPP: 25.0000 mg | Freq: Four times a day (QID) | RECTAL | 0 refills | Status: DC | PRN

## 2016-06-25 MED ORDER — PROMETHAZINE HCL 25 MG/ML IJ SOLN
12.5000 mg | Freq: Once | INTRAMUSCULAR | Status: AC
  Administered 2016-06-25: 12.5 mg via INTRAMUSCULAR
  Filled 2016-06-25: qty 1

## 2016-06-25 MED ORDER — DICYCLOMINE HCL 10 MG PO CAPS
20.0000 mg | ORAL_CAPSULE | Freq: Once | ORAL | Status: AC
  Administered 2016-06-25: 20 mg via ORAL
  Filled 2016-06-25: qty 2

## 2016-06-25 NOTE — MAU Note (Signed)
abd cramping.  Hasn't pooped in a wk.  Taking stool softeners.  Tried 'smooth move' tea, made her cramping worse

## 2016-06-25 NOTE — MAU Provider Note (Signed)
History     CSN: [id number]  Arrival date and time: 06/25/16 1616   First Provider Initiated Contact with Patient 06/25/16 1645      Chief Complaint  Patient presents with  . Abdominal Pain  . Constipation   HPI Audrey Pearson is a 26 y.o. G2P0010 at [redacted]w[redacted]d who presents with abdominal pain & constipation. PMH significant for IBS-C. Last BM was over a week ago. Has been taking stool softeners BID & tried a dose of laxative without relief. She has been taking phenergan & zofran with regular use for nausea/vomiting. Abdominal pain/cramping for the last week that has worsened today. Rates pain 8/10. Denies vaginal bleeding, rectal bleeding, vaginal discharge, fever, or dysuria.   OB History    Gravida Para Term Preterm AB Living   2 0 0 0 1 0   SAB TAB Ectopic Multiple Live Births   1 0 0 0 0      Past Medical History:  Diagnosis Date  . Asthma   . IBS (irritable bowel syndrome)     Past Surgical History:  Procedure Laterality Date  . NO PAST SURGERIES      Family History  Problem Relation Age of Onset  . Adopted: Yes  . Breast cancer Mother     Social History  Substance Use Topics  . Smoking status: Current Every Day Smoker    Types: Cigarettes    Last attempt to quit: 05/15/2015  . Smokeless tobacco: Never Used  . Alcohol use No     Comment: occ    Allergies: No Known Allergies  Prescriptions Prior to Admission  Medication Sig Dispense Refill Last Dose  . acetaminophen (TYLENOL) 500 MG tablet Take 1,000 mg by mouth every 6 (six) hours as needed for moderate pain.   06/24/2016 at Unknown time  . albuterol (PROVENTIL HFA;VENTOLIN HFA) 108 (90 Base) MCG/ACT inhaler Inhale 2 puffs into the lungs every 6 (six) hours as needed for wheezing or shortness of breath. 18 g 1 year at Unknown time  . docusate sodium (COLACE) 100 MG capsule Take 200 mg by mouth daily.   06/25/2016 at Unknown time  . ondansetron (ZOFRAN) 8 MG tablet Take 1 tablet (8 mg total) by mouth every 8  (eight) hours as needed for nausea or vomiting. 40 tablet 2 06/25/2016 at Unknown time  . Prenat-FeAsp-Meth-FA-DHA w/o A (PRENATE PIXIE) 10-0.6-0.4-200 MG CAPS Take 1 tablet by mouth daily. 30 capsule 12 06/25/2016 at Unknown time  . Doxylamine-Pyridoxine (DICLEGIS) 10-10 MG TBEC Take 1 tablet with breakfast and lunch.  Take 2 tablets at bedtime. (Patient not taking: Reported on 06/15/2016) 100 tablet 4 Not Taking at Unknown time  . oseltamivir (TAMIFLU) 75 MG capsule Take 1 capsule (75 mg total) by mouth 2 (two) times daily. (Patient not taking: Reported on 06/15/2016) 10 capsule 0 Not Taking at Unknown time  . promethazine (PHENERGAN) 25 MG suppository Place 1 suppository (25 mg total) rectally every 6 (six) hours as needed for nausea or vomiting. If cannot tolerate oral intake. (Patient not taking: Reported on 06/08/2016) 12 each 0 Not Taking  . promethazine (PHENERGAN) 25 MG tablet Take 1 tablet (25 mg total) by mouth every 6 (six) hours as needed for nausea or vomiting. (Patient not taking: Reported on 06/08/2016) 15 tablet 0 Not Taking  . terconazole (TERAZOL 7) 0.4 % vaginal cream Place 1 applicator vaginally at bedtime. (Patient not taking: Reported on 06/25/2016) 45 g 0 Not Taking at Unknown time    Review of  Systems  Constitutional: Negative for chills and fever.  Gastrointestinal: Positive for abdominal distention, abdominal pain, constipation, nausea and vomiting. Negative for anal bleeding, blood in stool and diarrhea.  Genitourinary: Negative.    Physical Exam   Blood pressure 119/68, pulse 87, temperature 98.3 F (36.8 C), resp. rate 18, weight 132 lb 4 oz (60 kg), last menstrual period 03/12/2016.  Physical Exam  Nursing note and vitals reviewed. Constitutional: She is oriented to person, place, and time. She appears well-developed and well-nourished. She appears distressed (pt in fetal position rocking in bed).  HENT:  Head: Normocephalic and atraumatic.  Eyes: Conjunctivae are  normal. Right eye exhibits no discharge. Left eye exhibits no discharge. No scleral icterus.  Neck: Normal range of motion.  Cardiovascular: Normal rate, regular rhythm and normal heart sounds.   No murmur heard. Respiratory: Effort normal and breath sounds normal. No respiratory distress. She has no wheezes.  GI: Soft. Bowel sounds are decreased. There is no tenderness. There is no rigidity, no rebound and no guarding.  Genitourinary:  Genitourinary Comments: Cervix closed  Neurological: She is alert and oriented to person, place, and time.  Skin: Skin is warm and dry. She is not diaphoretic.  Psychiatric: She has a normal mood and affect. Her behavior is normal. Judgment and thought content normal.    MAU Course  Procedures Results for orders placed or performed during the hospital encounter of 06/25/16 (from the past 24 hour(s))  Urinalysis, Routine w reflex microscopic     Status: Abnormal   Collection Time: 06/25/16  4:25 PM  Result Value Ref Range   Color, Urine STRAW (A) YELLOW   APPearance CLEAR CLEAR   Specific Gravity, Urine 1.011 1.005 - 1.030   pH 7.0 5.0 - 8.0   Glucose, UA NEGATIVE NEGATIVE mg/dL   Hgb urine dipstick NEGATIVE NEGATIVE   Bilirubin Urine NEGATIVE NEGATIVE   Ketones, ur NEGATIVE NEGATIVE mg/dL   Protein, ur NEGATIVE NEGATIVE mg/dL   Nitrite NEGATIVE NEGATIVE   Leukocytes, UA NEGATIVE NEGATIVE    MDM FHT 149 by doppler Soap suds enema resulted in large BM with moderate pain improvement Reglan 10 mg PO -- pt reports continue n/v after reglan Phenergan 12.5 mg IM Bentyl 20 mg PO Pain improved 9>3  Assessment and Plan  A: 1. Irritable bowel syndrome with constipation   2. Nausea and vomiting during pregnancy    P: Discharge home Rx miralax Continue stool softeners Refill promethazine suppositories D/c zofran Keep f/u with ob Discussed reasons to return to MAU  Judeth HornErin Jason Hauge 06/25/2016, 4:45 PM

## 2016-06-25 NOTE — Discharge Instructions (Signed)
Constipation, Adult Constipation is when a person has fewer bowel movements in a week than normal, has difficulty having a bowel movement, or has stools that are dry, hard, or larger than normal. Constipation may be caused by an underlying condition. It may become worse with age if a person takes certain medicines and does not take in enough fluids. Follow these instructions at home: Eating and drinking  Eat foods that have a lot of fiber, such as fresh fruits and vegetables, whole grains, and beans.  Limit foods that are high in fat, low in fiber, or overly processed, such as french fries, hamburgers, cookies, candies, and soda.  Drink enough fluid to keep your urine clear or pale yellow. General instructions  Exercise regularly or as told by your health care provider.  Go to the restroom when you have the urge to go. Do not hold it in.  Take over-the-counter and prescription medicines only as told by your health care provider. These include any fiber supplements.  Practice pelvic floor retraining exercises, such as deep breathing while relaxing the lower abdomen and pelvic floor relaxation during bowel movements.  Watch your condition for any changes.  Keep all follow-up visits as told by your health care provider. This is important. Contact a health care provider if:  You have pain that gets worse.  You have a fever.  You do not have a bowel movement after 4 days.  You vomit.  You are not hungry.  You lose weight.  You are bleeding from the anus.  You have thin, pencil-like stools. Get help right away if:  You have a fever and your symptoms suddenly get worse.  You leak stool or have blood in your stool.  Your abdomen is bloated.  You have severe pain in your abdomen.  You feel dizzy or you faint. This information is not intended to replace advice given to you by your health care provider. Make sure you discuss any questions you have with your health care  provider. Document Released: 01/27/2004 Document Revised: 11/18/2015 Document Reviewed: 10/19/2015 Elsevier Interactive Patient Education  2017 Elsevier Inc.         Diet for Irritable Bowel Syndrome Introduction When you have irritable bowel syndrome (IBS), the foods you eat and your eating habits are very important. IBS may cause various symptoms, such as abdominal pain, constipation, or diarrhea. Choosing the right foods can help ease discomfort caused by these symptoms. Work with your health care provider and dietitian to find the best eating plan to help control your symptoms. What general guidelines do I need to follow?  Keep a food diary. This will help you identify foods that cause symptoms. Write down:  What you eat and when.  What symptoms you have.  When symptoms occur in relation to your meals.  Avoid foods that cause symptoms. Talk with your dietitian about other ways to get the same nutrients that are in these foods.  Eat more foods that contain fiber. Take a fiber supplement if directed by your dietitian.  Eat your meals slowly, in a relaxed setting.  Aim to eat 5-6 small meals per day. Do not skip meals.  Drink enough fluids to keep your urine clear or pale yellow.  Ask your health care provider if you should take an over-the-counter probiotic during flare-ups to help restore healthy gut bacteria.  If you have cramping or diarrhea, try making your meals low in fat and high in carbohydrates. Examples of carbohydrates are pasta, rice, whole  grain breads and cereals, fruits, and vegetables.  If dairy products cause your symptoms to flare up, try eating less of them. You might be able to handle yogurt better than other dairy products because it contains bacteria that help with digestion. What foods are not recommended? The following are some foods and drinks that may worsen your symptoms:  Fatty foods, such as [place] fries.  Milk products, such as cheese or  ice cream.  Chocolate.  Alcohol.  Products with caffeine, such as coffee.  Carbonated drinks, such as soda. The items listed above may not be a complete list of foods and beverages to avoid. Contact your dietitian for more information.  What foods are good sources of fiber? Your health care provider or dietitian may recommend that you eat more foods that contain fiber. Fiber can help reduce constipation and other IBS symptoms. Add foods with fiber to your diet a little at a time so that your body can get used to them. Too much fiber at once might cause gas and swelling of your abdomen. The following are some foods that are good sources of fiber:  Apples.  Peaches.  Pears.  Berries.  Figs.  Broccoli (raw).  Cabbage.  Carrots.  Raw peas.  Kidney beans.  Lima beans.  Whole grain bread.  Whole grain cereal. Where to find more information: [employer] for Functional Gastrointestinal Disorders: www.iffgd.[employer] National Institute of Diabetes and Digestive and Kidney Diseases: http://norris-lawson.com/www.niddk.nih.gov/health-information/health-topics/digestive-diseases/ibs/Pages/facts.aspx This information is not intended to replace advice given to you by your health care provider. Make sure you discuss any questions you have with your health care provider. Document Released: 07/21/2003 Document Revised: 10/06/2015 Document Reviewed: 07/31/2013  2017 Elsevier

## 2016-07-06 ENCOUNTER — Ambulatory Visit (INDEPENDENT_AMBULATORY_CARE_PROVIDER_SITE_OTHER): Payer: Medicaid Other | Admitting: Certified Nurse Midwife

## 2016-07-06 VITALS — BP 124/82 | HR 84 | Wt 134.0 lb

## 2016-07-06 DIAGNOSIS — O219 Vomiting of pregnancy, unspecified: Secondary | ICD-10-CM

## 2016-07-06 DIAGNOSIS — O98312 Other infections with a predominantly sexual mode of transmission complicating pregnancy, second trimester: Secondary | ICD-10-CM

## 2016-07-06 DIAGNOSIS — A749 Chlamydial infection, unspecified: Secondary | ICD-10-CM

## 2016-07-06 DIAGNOSIS — O99012 Anemia complicating pregnancy, second trimester: Secondary | ICD-10-CM

## 2016-07-06 DIAGNOSIS — O99512 Diseases of the respiratory system complicating pregnancy, second trimester: Secondary | ICD-10-CM

## 2016-07-06 DIAGNOSIS — J4520 Mild intermittent asthma, uncomplicated: Secondary | ICD-10-CM

## 2016-07-06 DIAGNOSIS — Z3480 Encounter for supervision of other normal pregnancy, unspecified trimester: Secondary | ICD-10-CM

## 2016-07-06 DIAGNOSIS — D573 Sickle-cell trait: Secondary | ICD-10-CM

## 2016-07-06 MED ORDER — ONDANSETRON HCL 8 MG PO TABS: 8.0000 mg | ORAL_TABLET | Freq: Three times a day (TID) | ORAL | 2 refills | Status: DC | PRN

## 2016-07-06 NOTE — Progress Notes (Signed)
   PRENATAL VISIT NOTE  Subjective:  Audrey Pearson is a 26 y.o. G2P0010 at 8231w2d being seen today for ongoing prenatal care.  She is currently monitored for the following issues for this low-risk pregnancy and has Chlamydia; Supervision of normal pregnancy, antepartum; Mild intermittent asthma without complication; Subchorionic hematoma in first trimester; and Sickle cell trait (HCC) on her problem list.  Patient reports nausea, no bleeding, no contractions, no cramping, no leaking, vomiting and is able to keep down fluids/food, states that she throws up every morning..  Contractions: Not present. Vag. Bleeding: None.  Movement: Absent. Denies leaking of fluid.   The following portions of the patient's history were reviewed and updated as appropriate: allergies, current medications, past family history, past medical history, past social history, past surgical history and problem list. Problem list updated.  Objective:   Vitals:   07/06/16 0917  BP: 124/82  Pulse: 84  Weight: 134 lb (60.8 kg)    Fetal Status:     Movement: Absent     General:  Alert, oriented and cooperative. Patient is in no acute distress.  Skin: Skin is warm and dry. No rash noted.   Cardiovascular: Normal heart rate noted  Respiratory: Normal respiratory effort, no problems with respiration noted  Abdomen: Soft, gravid, appropriate for gestational age. Pain/Pressure: Absent     Pelvic:  Cervical exam deferred        Extremities: Normal range of motion.     Mental Status: Normal mood and affect. Normal behavior. Normal judgment and thought content.   Assessment and Plan:  Pregnancy: G2P0010 at 4731w2d  1. Supervision of other normal pregnancy, antepartum      - AFP, Serum, Open Spina Bifida - US MFM OB COMP + 14 WK; Future  2. Sickle cell trait (HCC)     Negative UC 05/2016  3. Chlamydia     TOC negative 06/08/16  4. Mild intermittent asthma without complication     Has Albuterol  5. Nausea and vomiting  during pregnancy prior to [redacted] weeks gestation      - ondansetron (ZOFRAN) 8 MG tablet; Take 1 tablet (8 mg total) by mouth every 8 (eight) hours as needed for nausea or vomiting.  Dispense: 40 tablet; Refill: 2  Preterm labor symptoms and general obstetric precautions including but not limited to vaginal bleeding, contractions, leaking of fluid and fetal movement were reviewed in detail with the patient. Please refer to After Visit Summary for other counseling recommendations.  Return in about 4 weeks (around 08/03/2016) for ROB.   Roe Coombsachelle A Denney, CNM

## 2016-07-06 NOTE — Progress Notes (Signed)
Pt states that she is very nauseated.

## 2016-07-19 ENCOUNTER — Encounter (HOSPITAL_COMMUNITY): Payer: Self-pay | Admitting: Certified Nurse Midwife

## 2016-07-25 ENCOUNTER — Ambulatory Visit (HOSPITAL_COMMUNITY)
Admission: RE | Admit: 2016-07-25 | Discharge: 2016-07-25 | Disposition: A | Discharge status: Home / Self Care | Payer: Medicaid Other | Source: Ambulatory Visit | Attending: Certified Nurse Midwife | Admitting: Certified Nurse Midwife

## 2016-07-25 DIAGNOSIS — M419 Scoliosis, unspecified: Secondary | ICD-10-CM | POA: Diagnosis not present

## 2016-07-25 DIAGNOSIS — Z3A19 19 weeks gestation of pregnancy: Secondary | ICD-10-CM | POA: Insufficient documentation

## 2016-07-25 DIAGNOSIS — Z363 Encounter for antenatal screening for malformations: Secondary | ICD-10-CM | POA: Diagnosis not present

## 2016-07-25 DIAGNOSIS — J45909 Unspecified asthma, uncomplicated: Secondary | ICD-10-CM | POA: Insufficient documentation

## 2016-07-25 DIAGNOSIS — O99512 Diseases of the respiratory system complicating pregnancy, second trimester: Secondary | ICD-10-CM | POA: Insufficient documentation

## 2016-07-25 DIAGNOSIS — Z862 Personal history of diseases of the blood and blood-forming organs and certain disorders involving the immune mechanism: Secondary | ICD-10-CM | POA: Diagnosis not present

## 2016-07-25 DIAGNOSIS — Z3480 Encounter for supervision of other normal pregnancy, unspecified trimester: Secondary | ICD-10-CM

## 2016-07-27 ENCOUNTER — Other Ambulatory Visit: Payer: Self-pay | Admitting: Certified Nurse Midwife

## 2016-07-27 DIAGNOSIS — Z3480 Encounter for supervision of other normal pregnancy, unspecified trimester: Secondary | ICD-10-CM

## 2016-07-30 ENCOUNTER — Other Ambulatory Visit (HOSPITAL_COMMUNITY): Payer: Self-pay | Admitting: Student

## 2016-07-30 ENCOUNTER — Other Ambulatory Visit: Payer: Self-pay | Admitting: Certified Nurse Midwife

## 2016-07-30 ENCOUNTER — Telehealth: Payer: Self-pay | Admitting: *Deleted

## 2016-07-30 DIAGNOSIS — O219 Vomiting of pregnancy, unspecified: Secondary | ICD-10-CM

## 2016-07-30 MED ORDER — PROMETHAZINE HCL 25 MG PO TABS: 25.0000 mg | ORAL_TABLET | Freq: Four times a day (QID) | ORAL | 3 refills | Status: DC | PRN

## 2016-07-30 MED ORDER — POLYETHYLENE GLYCOL 3350 17 G PO PACK: 17.0000 g | PACK | Freq: Every day | ORAL | 3 refills | Status: DC | PRN

## 2016-07-30 MED ORDER — PROMETHAZINE HCL 25 MG RE SUPP: 25.0000 mg | Freq: Four times a day (QID) | RECTAL | 2 refills | Status: DC | PRN

## 2016-07-30 NOTE — Telephone Encounter (Signed)
Patient notified

## 2016-07-30 NOTE — Telephone Encounter (Signed)
Patient is calling to request a different nausea mediation. She states the Zofran messes with her IBS/constipation and she is already taking twice daily stool softeners and  Mira lax when needed. Patient is out of the Phenergan she was given before- and would prefer that if possible. She is having a lot of nausea more than vomiting. Advise hints for nausea- grazing and keeping something on stomach. Will check with provider regarding Rx.

## 2016-07-30 NOTE — Telephone Encounter (Signed)
Please let her know that the phenergan was sent to her pharmacy.  Thank you.  R.Alayasia Breeding CNM

## 2016-08-03 ENCOUNTER — Encounter: Payer: Self-pay | Admitting: Certified Nurse Midwife

## 2016-08-03 ENCOUNTER — Ambulatory Visit (INDEPENDENT_AMBULATORY_CARE_PROVIDER_SITE_OTHER): Payer: Medicaid Other | Admitting: Certified Nurse Midwife

## 2016-08-03 VITALS — BP 124/74 | HR 102 | Wt 143.0 lb

## 2016-08-03 DIAGNOSIS — K581 Irritable bowel syndrome with constipation: Secondary | ICD-10-CM

## 2016-08-03 DIAGNOSIS — O99012 Anemia complicating pregnancy, second trimester: Secondary | ICD-10-CM

## 2016-08-03 DIAGNOSIS — Z3480 Encounter for supervision of other normal pregnancy, unspecified trimester: Secondary | ICD-10-CM

## 2016-08-03 DIAGNOSIS — D573 Sickle-cell trait: Secondary | ICD-10-CM

## 2016-08-03 HISTORY — DX: Irritable bowel syndrome with constipation: K58.1

## 2016-08-03 MED ORDER — VITAFOL GUMMIES 3.33-0.333-34.8 MG PO CHEW: 3.0000 | CHEWABLE_TABLET | Freq: Every day | ORAL | 12 refills | Status: DC

## 2016-08-03 NOTE — Progress Notes (Signed)
   PRENATAL VISIT NOTE  Subjective:  Audrey Pearson is a 26 y.o. G2P0010 at 1280w2d being seen today for ongoing prenatal care.  She is currently monitored for the following issues for this low-risk pregnancy and has Chlamydia; Supervision of normal pregnancy, antepartum; Mild intermittent asthma without complication; Subchorionic hematoma in first trimester; and Sickle cell trait (HCC) on her problem list.  Patient reports headache, no bleeding, no contractions, no cramping, no leaking and Has not tried OTC Tylenol for the HA, reports increased constipation, has IBS-C; is taking colace and miralax daily.  Contractions: Not present. Vag. Bleeding: None.  Movement: Present. Denies leaking of fluid.   The following portions of the patient's history were reviewed and updated as appropriate: allergies, current medications, past family history, past medical history, past social history, past surgical history and problem list. Problem list updated.  Objective:   Vitals:   08/03/16 0934  BP: 124/74  Pulse: (!) 102  Weight: 143 lb (64.9 kg)    Fetal Status: Fetal Heart Rate (bpm): 145 Fundal Height: 20 cm Movement: Present     General:  Alert, oriented and cooperative. Patient is in no acute distress.  Skin: Skin is warm and dry. No rash noted.   Cardiovascular: Normal heart rate noted  Respiratory: Normal respiratory effort, no problems with respiration noted  Abdomen: Soft, gravid, appropriate for gestational age. Pain/Pressure: Present     Pelvic:  Cervical exam deferred        Extremities: Normal range of motion.  Edema: None  Mental Status: Normal mood and affect. Normal behavior. Normal judgment and thought content.   Assessment and Plan:  Pregnancy: G2P0010 at 280w2d  1. Supervision of other normal pregnancy, antepartum     Changed PNV to gummies to help with constipation.  Advised to add Metamucil and laxatives as needed for BM.  Has not had a BM for 1 week.  Encouraged to increase  fiber, water and fruits/vegetables.  - AFP, Serum, Open Spina Bifida  2. Sickle cell trait (HCC)      Preterm labor symptoms and general obstetric precautions including but not limited to vaginal bleeding, contractions, leaking of fluid and fetal movement were reviewed in detail with the patient. Please refer to After Visit Summary for other counseling recommendations.  Return in about 4 weeks (around 08/31/2016) for ROB.   Roe Coombsachelle A Luretta Everly, CNM

## 2016-08-03 NOTE — Progress Notes (Signed)
Miralax is not working.

## 2016-08-10 LAB — AFP, SERUM, OPEN SPINA BIFIDA
AFP MoM: 0.62
AFP Value: 41.7 ng/mL
GEST. AGE ON COLLECTION DATE: 20.3 wk
MATERNAL AGE AT EDD: 29 a
OSBR Risk 1 IN: 10000
Test Results:: NEGATIVE
WEIGHT: 143 [lb_av]

## 2016-08-13 ENCOUNTER — Other Ambulatory Visit: Payer: Self-pay | Admitting: Certified Nurse Midwife

## 2016-08-13 DIAGNOSIS — Z3480 Encounter for supervision of other normal pregnancy, unspecified trimester: Secondary | ICD-10-CM

## 2016-08-29 ENCOUNTER — Ambulatory Visit (HOSPITAL_COMMUNITY)
Admission: RE | Admit: 2016-08-29 | Discharge: 2016-08-29 | Disposition: A | Discharge status: Home / Self Care | Payer: Medicaid Other | Source: Ambulatory Visit | Attending: Certified Nurse Midwife | Admitting: Certified Nurse Midwife

## 2016-08-29 ENCOUNTER — Other Ambulatory Visit: Payer: Self-pay | Admitting: Certified Nurse Midwife

## 2016-08-29 DIAGNOSIS — Z362 Encounter for other antenatal screening follow-up: Secondary | ICD-10-CM | POA: Insufficient documentation

## 2016-08-29 DIAGNOSIS — Z3A24 24 weeks gestation of pregnancy: Secondary | ICD-10-CM | POA: Diagnosis not present

## 2016-08-29 DIAGNOSIS — J45909 Unspecified asthma, uncomplicated: Secondary | ICD-10-CM | POA: Insufficient documentation

## 2016-08-29 DIAGNOSIS — Z8279 Family history of other congenital malformations, deformations and chromosomal abnormalities: Secondary | ICD-10-CM | POA: Insufficient documentation

## 2016-08-29 DIAGNOSIS — Z862 Personal history of diseases of the blood and blood-forming organs and certain disorders involving the immune mechanism: Secondary | ICD-10-CM

## 2016-08-29 DIAGNOSIS — Z3480 Encounter for supervision of other normal pregnancy, unspecified trimester: Secondary | ICD-10-CM

## 2016-08-29 DIAGNOSIS — O99512 Diseases of the respiratory system complicating pregnancy, second trimester: Secondary | ICD-10-CM | POA: Insufficient documentation

## 2016-08-30 ENCOUNTER — Other Ambulatory Visit: Payer: Self-pay | Admitting: Certified Nurse Midwife

## 2016-08-30 DIAGNOSIS — Z3480 Encounter for supervision of other normal pregnancy, unspecified trimester: Secondary | ICD-10-CM

## 2016-08-31 ENCOUNTER — Ambulatory Visit (INDEPENDENT_AMBULATORY_CARE_PROVIDER_SITE_OTHER): Payer: Medicaid Other | Admitting: Certified Nurse Midwife

## 2016-08-31 VITALS — BP 117/74 | HR 93 | Wt 143.0 lb

## 2016-08-31 DIAGNOSIS — Z3482 Encounter for supervision of other normal pregnancy, second trimester: Secondary | ICD-10-CM

## 2016-08-31 DIAGNOSIS — D573 Sickle-cell trait: Secondary | ICD-10-CM

## 2016-08-31 DIAGNOSIS — Z3480 Encounter for supervision of other normal pregnancy, unspecified trimester: Secondary | ICD-10-CM

## 2016-08-31 DIAGNOSIS — J4520 Mild intermittent asthma, uncomplicated: Secondary | ICD-10-CM

## 2016-08-31 DIAGNOSIS — O99012 Anemia complicating pregnancy, second trimester: Secondary | ICD-10-CM

## 2016-08-31 NOTE — Patient Instructions (Signed)
AREA PEDIATRIC/FAMILY PRACTICE PHYSICIANS  [facility] [address], [address] [place], [place]  [address] Phone - [phone]   Fax - [phone]  [facility] [address] [place], [place] [address] Phone - [phone]   Fax - [phone]  [facility] [address] [place], [place]  [address] Phone - [phone]   Fax - [phone]  [facility] [address] [place], [place]  [address] Phone - [phone]   Fax - [phone]  [facility] [address] [place], [place]  [address] Phone - [phone]   Fax - [phone]  [facility] [address] [place], [place]  [address] Phone - [phone]   Fax - [phone]  [facility] [address] [place], [place]  [address] Phone - [phone]   Fax - [phone]  [facility] [address], [address] [place], [place]  [address] Phone - [phone]   Fax - [phone]  [facility] [address] [place], [place]  [address] Phone - [phone]   Fax - [phone] [facility] [address] [place], [place]  [address] Phone - [phone]   Fax - [phone]  [facility] [address] [place], [place]  [address] Phone - [phone]   Fax - [phone]  [facility] [address] [place], [place]  [address] Phone - [phone]   Fax - [phone]  [facility] [address], [address] [place], [place]  [address] Phone - [phone]   Fax - [phone]  SHILPA GOSRANI [address] [place], [place]  [address] Phone - [phone]  [facility] [address] [place], [place]  [address] Phone - [phone]   Fax - [phone]  [facility] [address] [place], [place]  [address] Phone - [phone]   Fax - [phone]  [facility] [address] [place], [place]  [address] Phone - [phone]   Fax - [phone]  [facility] [address] [place], [place]  [address] Phone - [phone]   Fax - [phone]   [facility] [address] [place], [place]  [address] Phone - [phone]   Fax - [phone]  [facility] [address] [place], [place]  [address] Phone - [phone]   Fax - [phone]  DAV[facility] RUBIN [address] [place], [place]  [address] Phone - [phone]   Fax - [phone]  [facility] [address] [place], [place]  [address] Phone - [phone]   Fax - [phone]  [facility] [address] , [place]  [address] Phone - [phone]   Fax - [phone] [facility] [address] [place], [place]  [address] Phone - [phone]   Fax - [phone]  [facility] [address] [place], [place]  [address] Phone - [phone]   Fax - [phone]   [facility] [address] [place], [place]  [address] Phone - [phone]   Fax - [phone]  [facility] Charlene Flemming MD [address] [place]  [address] Phone [phone]  Fax [phone]   

## 2016-08-31 NOTE — Progress Notes (Signed)
   PRENATAL VISIT NOTE  Subjective:  Audrey Pearson is a 26 y.o. G2P0010 at [redacted]w[redacted]d being seen today for ongoing prenatal care.  She is currently monitored for the following issues for this low-risk pregnancy and has Chlamydia; Supervision of normal pregnancy, antepartum; Mild intermittent asthma without complication; Subchorionic hematoma in first trimester; Sickle cell trait (HCC); and IBS (irritable bowel syndrome) on her problem list.  Patient reports no complaints.  Contractions: Not present. Vag. Bleeding: None.  Movement: Present. Denies leaking of fluid.   The following portions of the patient's history were reviewed and updated as appropriate: allergies, current medications, past family history, past medical history, past social history, past surgical history and problem list. Problem list updated.  Objective:   Vitals:   08/31/16 0940  BP: 117/74  Pulse: 93  Weight: 143 lb (64.9 kg)    Fetal Status: Fetal Heart Rate (bpm): 147 Fundal Height: 24 cm Movement: Present     General:  Alert, oriented and cooperative. Patient is in no acute distress.  Skin: Skin is warm and dry. No rash noted.   Cardiovascular: Normal heart rate noted  Respiratory: Normal respiratory effort, no problems with respiration noted  Abdomen: Soft, gravid, appropriate for gestational age. Pain/Pressure: Present     Pelvic:  Cervical exam deferred        Extremities: Normal range of motion.  Edema: Trace  Mental Status: Normal mood and affect. Normal behavior. Normal judgment and thought content.   Assessment and Plan:  Pregnancy: G2P0010 at [redacted]w[redacted]d  1. Mild intermittent asthma without complication     Stable: albuterol  2. Supervision of other normal pregnancy, antepartum      Doing well physically, watch emotionally, self supporting no family involvement/FOB MIA.  SW to meet with her in office for support.  High risk for depression.   3. Sickle cell trait (HCC)      - Culture, OB Urine  Preterm  labor symptoms and general obstetric precautions including but not limited to vaginal bleeding, contractions, leaking of fluid and fetal movement were reviewed in detail with the patient. Please refer to After Visit Summary for other counseling recommendations.  Return in about 4 weeks (around 09/28/2016) for ROB, 2 hr OGTT.   Roe Coombs, CNM

## 2016-09-11 LAB — CULTURE, OB URINE

## 2016-09-15 ENCOUNTER — Encounter (HOSPITAL_COMMUNITY): Payer: Self-pay

## 2016-09-15 ENCOUNTER — Inpatient Hospital Stay (HOSPITAL_COMMUNITY)
Admission: AD | Admit: 2016-09-15 | Discharge: 2016-09-15 | Disposition: A | Discharge status: Home / Self Care | Payer: Medicaid Other | Source: Ambulatory Visit | Attending: Obstetrics & Gynecology | Admitting: Obstetrics & Gynecology

## 2016-09-15 DIAGNOSIS — K529 Noninfective gastroenteritis and colitis, unspecified: Secondary | ICD-10-CM

## 2016-09-15 DIAGNOSIS — O26892 Other specified pregnancy related conditions, second trimester: Secondary | ICD-10-CM | POA: Diagnosis present

## 2016-09-15 DIAGNOSIS — O9989 Other specified diseases and conditions complicating pregnancy, childbirth and the puerperium: Secondary | ICD-10-CM

## 2016-09-15 DIAGNOSIS — O99512 Diseases of the respiratory system complicating pregnancy, second trimester: Secondary | ICD-10-CM | POA: Insufficient documentation

## 2016-09-15 DIAGNOSIS — R109 Unspecified abdominal pain: Secondary | ICD-10-CM | POA: Diagnosis not present

## 2016-09-15 DIAGNOSIS — R509 Fever, unspecified: Secondary | ICD-10-CM | POA: Insufficient documentation

## 2016-09-15 DIAGNOSIS — O219 Vomiting of pregnancy, unspecified: Secondary | ICD-10-CM

## 2016-09-15 DIAGNOSIS — O99332 Smoking (tobacco) complicating pregnancy, second trimester: Secondary | ICD-10-CM | POA: Diagnosis not present

## 2016-09-15 LAB — URINALYSIS, ROUTINE W REFLEX MICROSCOPIC
Bilirubin Urine: NEGATIVE
Glucose, UA: NEGATIVE mg/dL
Hgb urine dipstick: NEGATIVE
Ketones, ur: NEGATIVE mg/dL
Leukocytes, UA: NEGATIVE
Nitrite: NEGATIVE
PH: 6 (ref 5.0–8.0)
Protein, ur: NEGATIVE mg/dL
SPECIFIC GRAVITY, URINE: 1.008 (ref 1.005–1.030)

## 2016-09-15 MED ORDER — ONDANSETRON HCL 8 MG PO TABS: 8.0000 mg | ORAL_TABLET | Freq: Three times a day (TID) | ORAL | 0 refills | Status: DC | PRN

## 2016-09-15 NOTE — MAU Provider Note (Signed)
History     CSN: [id number]  Arrival date and time: 09/15/16 1634   First Provider Initiated Contact with Patient 09/15/16 1712      Chief Complaint  Patient presents with  . Fever  . Abdominal Pain   HPI [name] is a 26 y.o. G2P0010 at [redacted]w[redacted]d who presents to MAU today with complaint of abdominal pain and fever. The patient states that she has had N/V/D x 2 days that resolved last night. She denies N/V/D today. She states headache earlier today and then took Tylenol at 1300. When she woke up at 1600 she took her temperature and states it was 101 F. She continues to have diffuse abdominal pain. It is mild. She denies vaginal bleeding, discharge, LOF, contractions today. She reports good fetal movement.    OB History    Gravida Para Term Preterm AB Living   2 0 0 0 1 0   SAB TAB Ectopic Multiple Live Births   1 0 0 0 0      Past Medical History:  Diagnosis Date  . Asthma   . IBS (irritable bowel syndrome)     Past Surgical History:  Procedure Laterality Date  . NO PAST SURGERIES      Family History  Problem Relation Age of Onset  . Adopted: Yes  . Breast cancer Mother     Social History  Substance Use Topics  . Smoking status: Current Every Day Smoker    Types: Cigarettes    Last attempt to quit: 05/15/2015  . Smokeless tobacco: Never Used  . Alcohol use No     Comment: occ    Allergies: No Known Allergies  No prescriptions prior to admission.    Review of Systems  Constitutional: Positive for fever.  Gastrointestinal: Positive for abdominal pain, diarrhea, nausea and vomiting. Negative for constipation.  Genitourinary: Negative for dysuria, frequency, urgency, vaginal bleeding and vaginal discharge.  Musculoskeletal: Positive for back pain.   Physical Exam   Blood pressure 111/69, pulse 76, temperature 98.9 F (37.2 C), temperature source Oral, resp. rate 16, last menstrual period 03/12/2016, SpO2 100 %.  Physical Exam  Nursing note and  vitals reviewed. Constitutional: She is oriented to person, place, and time. She appears well-developed and well-nourished. No distress.  HENT:  Head: Normocephalic and atraumatic.  Cardiovascular: Normal rate.   Respiratory: Effort normal.  GI: Soft. She exhibits no distension and no mass. There is tenderness (very mild diffuse). There is no rebound and no guarding.  Neurological: She is alert and oriented to person, place, and time.  Skin: Skin is warm and dry. No erythema.  Psychiatric: She has a normal mood and affect.    Results for orders placed or performed during the hospital encounter of 09/15/16 (from the past 24 hour(s))  Urinalysis, Routine w reflex microscopic     Status: Abnormal   Collection Time: 09/15/16  4:55 PM  Result Value Ref Range   Color, Urine STRAW (A) YELLOW   APPearance CLEAR CLEAR   Specific Gravity, Urine 1.008 1.005 - 1.030   pH 6.0 5.0 - 8.0   Glucose, UA NEGATIVE NEGATIVE mg/dL   Hgb urine dipstick NEGATIVE NEGATIVE   Bilirubin Urine NEGATIVE NEGATIVE   Ketones, ur NEGATIVE NEGATIVE mg/dL   Protein, ur NEGATIVE NEGATIVE mg/dL   Nitrite NEGATIVE NEGATIVE   Leukocytes, UA NEGATIVE NEGATIVE    Fetal Monitoring: Baseline: 140 bpm Variability: moderate Accelerations: 10 x 10 Decelerations: variable noted Contractions: none   MAU  Course  Procedures None  MDM UA today without dehydration No current nausea. No emesis today.   Assessment and Plan  A: Recent gastroenteritis, resolved Abdominal pain in pregnancy, second trimester   P: Discharge home Rx for Zofran given to patient  Diet for N/V given Preterm labor precautions discussed Patient advised to follow-up with [facility] as scheduled for routine prenatal care or sooner if symptoms persist or worsen Patient may return to MAU as needed or if her condition were to change or worsen  Vonzella NippleJulie Chrissa Meetze, PA-C 09/15/2016, 6:22 PM

## 2016-09-15 NOTE — Discharge Instructions (Signed)
Food Choices to Help Relieve Diarrhea, Adult When you have diarrhea, the foods you eat and your eating habits are very important. Choosing the right foods and drinks can help:  Relieve diarrhea.  Replace lost fluids and nutrients.  Prevent dehydration.  What general guidelines should I follow? Relieving diarrhea  Choose foods with less than 2 g or .07 oz. of fiber per serving.  Limit fats to less than 8 tsp (38 g or 1.34 oz.) a day.  Avoid the following: ? Foods and beverages sweetened with high-fructose corn syrup, honey, or sugar alcohols such as xylitol, sorbitol, and mannitol. ? Foods that contain a lot of fat or sugar. ? Fried, greasy, or spicy foods. ? High-fiber grains, breads, and cereals. ? Raw fruits and vegetables.  Eat foods that are rich in probiotics. These foods include dairy products such as yogurt and fermented milk products. They help increase healthy bacteria in the stomach and intestines (gastrointestinal tract, or GI tract).  If you have lactose intolerance, avoid dairy products. These may make your diarrhea worse.  Take medicine to help stop diarrhea (antidiarrheal medicine) only as told by your health care provider. Replacing nutrients  Eat small meals or snacks every 3-4 hours.  Eat bland foods, such as white rice, toast, or baked potato, until your diarrhea starts to get better. Gradually reintroduce nutrient-rich foods as tolerated or as told by your health care provider. This includes: ? Well-cooked protein foods. ? Peeled, seeded, and soft-cooked fruits and vegetables. ? Low-fat dairy products.  Take vitamin and mineral supplements as told by your health care provider. Preventing dehydration   Start by sipping water or a special solution to prevent dehydration (oral rehydration solution, ORS). Urine that is clear or pale yellow means that you are getting enough fluid.  Try to drink at least 8-10 cups of fluid each day to help replace lost  fluids.  You may add other liquids in addition to water, such as clear juice or decaffeinated sports drinks, as tolerated or as told by your health care provider.  Avoid drinks with caffeine, such as coffee, tea, or soft drinks.  Avoid alcohol. What foods are recommended? The items listed may not be a complete list. Talk with your health care provider about what dietary choices are best for you. Grains White rice. White, French, or pita breads (fresh or toasted), including plain rolls, buns, or bagels. White pasta. Saltine, soda, or graham crackers. Pretzels. Low-fiber cereal. Cooked cereals made with water (such as cornmeal, farina, or cream cereals). Plain muffins. Matzo. Melba toast. Zwieback. Vegetables Potatoes (without the skin). Most well-cooked and canned vegetables without skins or seeds. Tender lettuce. Fruits Apple sauce. Fruits canned in juice. Cooked apricots, cherries, grapefruit, peaches, pears, or plums. Fresh bananas and cantaloupe. Meats and other protein foods Baked or boiled chicken. Eggs. Tofu. Fish. Seafood. Smooth nut butters. Ground or well-cooked tender beef, ham, veal, lamb, pork, or poultry. Dairy Plain yogurt, kefir, and unsweetened liquid yogurt. Lactose-free milk, buttermilk, skim milk, or soy milk. Low-fat or nonfat hard cheese. Beverages Water. Low-calorie sports drinks. Fruit juices without pulp. Strained tomato and vegetable juices. Decaffeinated teas. Sugar-free beverages not sweetened with sugar alcohols. Oral rehydration solutions, if approved by your health care provider. Seasoning and other foods Bouillon, broth, or soups made from recommended foods. What foods are not recommended? The items listed may not be a complete list. Talk with your health care provider about what dietary choices are best for you. Grains Whole grain, whole wheat,   bran, or rye breads, rolls, pastas, and crackers. Wild or brown rice. Whole grain or bran cereals. Barley. Oats and  oatmeal. Corn tortillas or taco shells. Granola. Popcorn. Vegetables Raw vegetables. Fried vegetables. Cabbage, broccoli, Brussels sprouts, artichokes, baked beans, beet greens, corn, kale, legumes, peas, sweet potatoes, and yams. Potato skins. Cooked spinach and cabbage. Fruits Dried fruit, including raisins and dates. Raw fruits. Stewed or dried prunes. Canned fruits with syrup. Meat and other protein foods Fried or fatty meats. Deli meats. Chunky nut butters. Nuts and seeds. Beans and lentils. Bacon. Hot dogs. Sausage. Dairy High-fat cheeses. Whole milk, chocolate milk, and beverages made with milk, such as milk shakes. Half-and-half. Cream. sour cream. Ice cream. Beverages Caffeinated beverages (such as coffee, tea, soda, or energy drinks). Alcoholic beverages. Fruit juices with pulp. Prune juice. Soft drinks sweetened with high-fructose corn syrup or sugar alcohols. High-calorie sports drinks. Fats and oils Butter. Cream sauces. Margarine. Salad oils. Plain salad dressings. Olives. Avocados. Mayonnaise. Sweets and desserts Sweet rolls, doughnuts, and sweet breads. Sugar-free desserts sweetened with sugar alcohols such as xylitol and sorbitol. Seasoning and other foods Honey. Hot sauce. Chili powder. Gravy. Cream-based or milk-based soups. Pancakes and waffles. Summary  When you have diarrhea, the foods you eat and your eating habits are very important.  Make sure you get at least 8-10 cups of fluid each day, or enough to keep your urine clear or pale yellow.  Eat bland foods and gradually reintroduce healthy, nutrient-rich foods as tolerated, or as told by your health care provider.  Avoid high-fiber, fried, greasy, or spicy foods. This information is not intended to replace advice given to you by your health care provider. Make sure you discuss any questions you have with your health care provider. Document Released: 07/21/2003 Document Revised: 04/27/2016 Document Reviewed:  04/27/2016 Elsevier Interactive Patient Education  2017 [employer]  

## 2016-09-15 NOTE — Progress Notes (Addendum)
G2P0 @ [redacted] wksga. Presents to triage for abdominal pain and fever. Stated vomiting and diarhea yesterday but not today. Urine specimen collected. States took tylenol around 1300. Denies LOF or bleeding. VSS see flowsheet for details. EFM applied  Sees [facility]  OB.   1725: Urine result pending. Provider at bs assessing pt.

## 2016-09-28 ENCOUNTER — Other Ambulatory Visit: Payer: Medicaid Other

## 2016-09-28 ENCOUNTER — Ambulatory Visit (INDEPENDENT_AMBULATORY_CARE_PROVIDER_SITE_OTHER): Payer: Medicaid Other | Admitting: Certified Nurse Midwife

## 2016-09-28 ENCOUNTER — Encounter: Payer: Self-pay | Admitting: Certified Nurse Midwife

## 2016-09-28 VITALS — BP 115/77 | HR 96 | Wt 148.0 lb

## 2016-09-28 DIAGNOSIS — Z3480 Encounter for supervision of other normal pregnancy, unspecified trimester: Secondary | ICD-10-CM

## 2016-09-28 DIAGNOSIS — Z3482 Encounter for supervision of other normal pregnancy, second trimester: Secondary | ICD-10-CM

## 2016-09-28 NOTE — Progress Notes (Signed)
Patient reports good fetal movement, denies contractions. Pt reports occasionally feeling pelvic pressure.

## 2016-09-28 NOTE — Patient Instructions (Addendum)
Contraception Choices Contraception (birth control) is the use of any methods or devices to prevent pregnancy. Below are some methods to help avoid pregnancy. Hormonal methods  Contraceptive implant. This is a thin, plastic tube containing progesterone hormone. It does not contain estrogen hormone. Your health care provider inserts the tube in the inner part of the upper arm. The tube can remain in place for up to 3 years. After 3 years, the implant must be removed. The implant prevents the ovaries from releasing an egg (ovulation), thickens the cervical mucus to prevent sperm from entering the uterus, and thins the lining of the inside of the uterus.  Progesterone-only injections. These injections are given every 3 months by your health care provider to prevent pregnancy. This synthetic progesterone hormone stops the ovaries from releasing eggs. It also thickens cervical mucus and changes the uterine lining. This makes it harder for sperm to survive in the uterus.  Birth control pills. These pills contain estrogen and progesterone hormone. They work by preventing the ovaries from releasing eggs (ovulation). They also cause the cervical mucus to thicken, preventing the sperm from entering the uterus. Birth control pills are prescribed by a health care provider.Birth control pills can also be used to treat heavy periods.  Minipill. This type of birth control pill contains only the progesterone hormone. They are taken every day of each month and must be prescribed by your health care provider.  Birth control patch. The patch contains hormones similar to those in birth control pills. It must be changed once a week and is prescribed by a health care provider.  Vaginal ring. The ring contains hormones similar to those in birth control pills. It is left in the vagina for 3 weeks, removed for 1 week, and then a new one is put back in place. The patient must be comfortable inserting and removing the ring from  the vagina.A health care provider's prescription is necessary.  Emergency contraception. Emergency contraceptives prevent pregnancy after unprotected sexual intercourse. This pill can be taken right after sex or up to 5 days after unprotected sex. It is most effective the sooner you take the pills after having sexual intercourse. Most emergency contraceptive pills are available without a prescription. Check with your pharmacist. Do not use emergency contraception as your only form of birth control. Barrier methods  Female condom. This is a thin sheath (latex or rubber) that is worn over the penis during sexual intercourse. It can be used with spermicide to increase effectiveness.  Female condom. This is a soft, loose-fitting sheath that is put into the vagina before sexual intercourse.  Diaphragm. This is a soft, latex, dome-shaped barrier that must be fitted by a health care provider. It is inserted into the vagina, along with a spermicidal jelly. It is inserted before intercourse. The diaphragm should be left in the vagina for 6 to 8 hours after intercourse.  Cervical cap. This is a round, soft, latex or plastic cup that fits over the cervix and must be fitted by a health care provider. The cap can be left in place for up to 48 hours after intercourse.  Sponge. This is a soft, circular piece of polyurethane foam. The sponge has spermicide in it. It is inserted into the vagina after wetting it and before sexual intercourse.  Spermicides. These are chemicals that kill or block sperm from entering the cervix and uterus. They come in the form of creams, jellies, suppositories, foam, or tablets. They do not require a prescription. They   are inserted into the vagina with an applicator before having sexual intercourse. The process must be repeated every time you have sexual intercourse. Intrauterine contraception  Intrauterine device (IUD). This is a T-shaped device that is put in a woman's uterus during  a menstrual period to prevent pregnancy. There are 2 types: ? Copper IUD. This type of IUD is wrapped in copper wire and is placed inside the uterus. Copper makes the uterus and fallopian tubes produce a fluid that kills sperm. It can stay in place for 10 years. ? Hormone IUD. This type of IUD contains the hormone progestin (synthetic progesterone). The hormone thickens the cervical mucus and prevents sperm from entering the uterus, and it also thins the uterine lining to prevent implantation of a fertilized egg. The hormone can weaken or kill the sperm that get into the uterus. It can stay in place for 3-5 years, depending on which type of IUD is used. Permanent methods of contraception  Female tubal ligation. This is when the woman's fallopian tubes are surgically sealed, tied, or blocked to prevent the egg from traveling to the uterus.  Hysteroscopic sterilization. This involves placing a small coil or insert into each fallopian tube. Your doctor uses a technique called hysteroscopy to do the procedure. The device causes scar tissue to form. This results in permanent blockage of the fallopian tubes, so the sperm cannot fertilize the egg. It takes about 3 months after the procedure for the tubes to become blocked. You must use another form of birth control for these 3 months.  Female sterilization. This is when the female has the tubes that carry sperm tied off (vasectomy).This blocks sperm from entering the vagina during sexual intercourse. After the procedure, the man can still ejaculate fluid (semen). Natural planning methods  Natural family planning. This is not having sexual intercourse or using a barrier method (condom, diaphragm, cervical cap) on days the woman could become pregnant.  Calendar method. This is keeping track of the length of each menstrual cycle and identifying when you are fertile.  Ovulation method. This is avoiding sexual intercourse during ovulation.  Symptothermal method.  This is avoiding sexual intercourse during ovulation, using a thermometer and ovulation symptoms.  Post-ovulation method. This is timing sexual intercourse after you have ovulated. Regardless of which type or method of contraception you choose, it is important that you use condoms to protect against the transmission of sexually transmitted infections (STIs). Talk with your health care provider about which form of contraception is most appropriate for you. This information is not intended to replace advice given to you by your health care provider. Make sure you discuss any questions you have with your health care provider. Document Released: 04/30/2005 Document Revised: 10/06/2015 Document Reviewed: 10/23/2012 Elsevier Interactive Patient Education  2017 Elsevier Inc[employer]   AREA PEDIATRIC/FAMILY PRACTICE PHYSICIANS  [facility] [address] [place], [place]  [address] Phone - [phone]   Fax - [phone]  [facility] [address] [place], [place] [address] Phone - [phone]   Fax - [phone]  [facility] [address] [place], [place]  [address] Phone - [phone]   Fax - [phone]  [facility] [address] [place], [place]  [address] Phone - [phone]   Fax - [phone]  [facility] [address] [place], [place]  [address] Phone - [phone]   Fax - [phone]  [facility] [address]   [place], [place]  [address] Phone - [phone]   Fax - [phone]  [facility] [address] [place], [place]  [address] Phone - [phone]   Fax - [phone]  [facility] [address] [place], [place]  [address] Phone - [phone]   Fax - [phone]  [facility] [address] [place], [place]  [address] Phone - [phone]    Fax - [phone] [facility] [address] [place], [place]  [address] Phone - [phone]   Fax - [phone]  [facility] [address] [place], [place]  [address] Phone - [phone]   Fax - [phone]  [facility] [address] [place], [place]  [address] Phone - [phone]   Fax - [phone]  [facility] [address] [place], [place]  [address] Phone - [phone]   Fax - [phone]  SHILPA GOSRANI [address] [place], [place]  [address] Phone - [phone]  [facility] [address] [place], [place]  [address] Phone - [phone]   Fax - [phone]  [facility] [address] [place], [place]  [address] Phone - [phone]   Fax - [phone]  [facility] [address] [place], [place]  [address] Phone - [phone]   Fax - [phone]  [facility] [address] [place], [place]  [address] Phone - [phone]   Fax - [phone]   [facility] [address] [place], [place]  [address] Phone - [phone]   Fax - [phone]  [facility] [address] [place], [place]  [address] Phone - [phone]   Fax - [phone]  DAVID RUBIN [address] [place], [place]  [address] Phone - [phone]   Fax - [phone]  [facility] [address] [place], [place]  [address] Phone - [phone]   Fax - [phone]  [facility] [address] [place], [place]  [address] Phone - [phone]   Fax - [phone] [facility] [address] [place], [place]  [address] Phone - [phone]   Fax - [phone]  [facility] [address] [place], [place]  [address] Phone - [phone]   Fax -  [phone]  [facility] [address] [place], [place]  [address] Phone - [phone]   Fax - [phone]  [facility] Charlene Flemming MD [address]  [place] [address] Phone [phone]  Fax [phone]   

## 2016-09-28 NOTE — Progress Notes (Signed)
   PRENATAL VISIT NOTE  Subjective:  Audrey Pearson is a 26 y.o. G2P0010 at 98w2dbeing seen today for ongoing prenatal care.  She is currently monitored for the following issues for this low-risk pregnancy and has Chlamydia; Supervision of normal pregnancy, antepartum; Mild intermittent asthma without complication; Subchorionic hematoma in first trimester; Sickle cell trait ([place]; and IBS (irritable bowel syndrome) on her problem list.  Patient reports no complaints.  Contractions: Not present.  .  Movement: Present. Denies leaking of fluid.   The following portions of the patient's history were reviewed and updated as appropriate: allergies, current medications, past family history, past medical history, past social history, past surgical history and problem list. Problem list updated.  Objective:   Vitals:   09/28/16 0832 09/28/16 0838  BP: (!) 151/88 115/77  Pulse: 99 96  Weight: 148 lb (67.1 kg)     Fetal Status: Fetal Heart Rate (bpm): 150 Fundal Height: 28 cm Movement: Present     General:  Alert, oriented and cooperative. Patient is in no acute distress.  Skin: Skin is warm and dry. No rash noted.   Cardiovascular: Normal heart rate noted  Respiratory: Normal respiratory effort, no problems with respiration noted  Abdomen: Soft, gravid, appropriate for gestational age. Pain/Pressure: Absent     Pelvic:  Cervical exam deferred        Extremities: Normal range of motion.  Edema: Trace  Mental Status: Normal mood and affect. Normal behavior. Normal judgment and thought content.   Assessment and Plan:  Pregnancy: G2P0010 at 246w2d1. Supervision of other normal pregnancy, antepartum     Doing well.  SW met with patient this AM.  Probably first reading was erroneous.   - Glucose Tolerance, 2 Hours w/1 Hour - CBC - HIV antibody (with reflex) - RPR - Culture, OB Urine  Preterm labor symptoms and general obstetric precautions including but not limited to vaginal bleeding,  contractions, leaking of fluid and fetal movement were reviewed in detail with the patient. Please refer to After Visit Summary for other counseling recommendations.  Return in about 2 weeks (around 10/12/2016) for ROB.   RaMorene CrockerCNM

## 2016-09-29 LAB — RPR: RPR: NONREACTIVE

## 2016-09-29 LAB — GLUCOSE TOLERANCE, 2 HOURS W/ 1HR
GLUCOSE, 2 HOUR: 106 mg/dL (ref 65–152)
Glucose, 1 hour: 94 mg/dL (ref 65–179)
Glucose, Fasting: 71 mg/dL (ref 65–91)

## 2016-09-29 LAB — CBC
HEMATOCRIT: 34.1 % (ref 34.0–46.6)
HEMOGLOBIN: 11.3 g/dL (ref 11.1–15.9)
MCH: 30 pg (ref 26.6–33.0)
MCHC: 33.1 g/dL (ref 31.5–35.7)
MCV: 91 fL (ref 79–97)
Platelets: 202 10*3/uL (ref 150–379)
RBC: 3.77 x10E6/uL (ref 3.77–5.28)
RDW: 13.6 % (ref 12.3–15.4)
WBC: 13.5 10*3/uL — ABNORMAL HIGH (ref 3.4–10.8)

## 2016-09-29 LAB — HIV ANTIBODY (ROUTINE TESTING W REFLEX): HIV SCREEN 4TH GENERATION: NONREACTIVE

## 2016-09-30 LAB — CULTURE, OB URINE

## 2016-09-30 LAB — URINE CULTURE, OB REFLEX: ORGANISM ID, BACTERIA: NO GROWTH

## 2016-10-01 ENCOUNTER — Other Ambulatory Visit: Payer: Self-pay | Admitting: Certified Nurse Midwife

## 2016-10-01 DIAGNOSIS — Z3480 Encounter for supervision of other normal pregnancy, unspecified trimester: Secondary | ICD-10-CM

## 2016-10-15 ENCOUNTER — Ambulatory Visit (INDEPENDENT_AMBULATORY_CARE_PROVIDER_SITE_OTHER): Payer: Medicaid Other | Admitting: Certified Nurse Midwife

## 2016-10-15 ENCOUNTER — Encounter: Payer: Self-pay | Admitting: Certified Nurse Midwife

## 2016-10-15 VITALS — BP 123/81 | HR 99 | Wt 152.0 lb

## 2016-10-15 DIAGNOSIS — Z3480 Encounter for supervision of other normal pregnancy, unspecified trimester: Secondary | ICD-10-CM

## 2016-10-15 DIAGNOSIS — O219 Vomiting of pregnancy, unspecified: Secondary | ICD-10-CM

## 2016-10-15 DIAGNOSIS — O99613 Diseases of the digestive system complicating pregnancy, third trimester: Secondary | ICD-10-CM

## 2016-10-15 DIAGNOSIS — K219 Gastro-esophageal reflux disease without esophagitis: Secondary | ICD-10-CM

## 2016-10-15 MED ORDER — OMEPRAZOLE 20 MG PO CPDR: 20.0000 mg | DELAYED_RELEASE_CAPSULE | Freq: Two times a day (BID) | ORAL | 5 refills | Status: DC

## 2016-10-15 MED ORDER — PROCHLORPERAZINE MALEATE 10 MG PO TABS: 10.0000 mg | ORAL_TABLET | Freq: Four times a day (QID) | ORAL | 0 refills | Status: DC | PRN

## 2016-10-15 MED ORDER — CITRANATAL BLOOM 90-1 MG PO TABS: 1.0000 | ORAL_TABLET | Freq: Every day | ORAL | 12 refills | Status: DC

## 2016-10-15 NOTE — Progress Notes (Signed)
   PRENATAL VISIT NOTE  Subjective:  Audrey Pearson is a 26 y.o. G2P0010 at 3925w5d being seen today for ongoing prenatal care.  She is currently monitored for the following issues for this low-risk pregnancy and has Chlamydia; Supervision of normal pregnancy, antepartum; Mild intermittent asthma without complication; Subchorionic hematoma in first trimester; Sickle cell trait (HCC); and IBS (irritable bowel syndrome) on her problem list.  Patient reports heartburn, nausea, no bleeding, no contractions, no cramping, no leaking and vomiting.  Contractions: Not present. Vag. Bleeding: None.  Movement: Present. Denies leaking of fluid.   The following portions of the patient's history were reviewed and updated as appropriate: allergies, current medications, past family history, past medical history, past social history, past surgical history and problem list. Problem list updated.  Objective:   Vitals:   10/15/16 1024  BP: 123/81  Pulse: 99  Weight: 152 lb (68.9 kg)    Fetal Status: Fetal Heart Rate (bpm): 147 Fundal Height: 30 cm Movement: Present     General:  Alert, oriented and cooperative. Patient is in no acute distress.  Skin: Skin is warm and dry. No rash noted.   Cardiovascular: Normal heart rate noted  Respiratory: Normal respiratory effort, no problems with respiration noted  Abdomen: Soft, gravid, appropriate for gestational age. Pain/Pressure: Present     Pelvic:  Cervical exam deferred        Extremities: Normal range of motion.  Edema: Trace  Mental Status: Normal mood and affect. Normal behavior. Normal judgment and thought content.   Assessment and Plan:  Pregnancy: G2P0010 at 6425w5d  1. Supervision of other normal pregnancy, antepartum       - Tdap vaccine greater than or equal to 7yo IM - Prenatal-DSS-FeCb-FeGl-FA (CITRANATAL BLOOM) 90-1 MG TABS; Take 1 tablet by mouth daily.  Dispense: 30 tablet; Refill: 12  2. Gastroesophageal reflux during pregnancy, antepartum,  third trimester     Takes OTC TUMS as well - omeprazole (PRILOSEC) 20 MG capsule; Take 1 capsule (20 mg total) by mouth 2 (two) times daily before a meal.  Dispense: 60 capsule; Refill: 5  3. Nausea and vomiting in pregnancy     Has tried Zofran, Diclegis, and Phenergan.  Nothing helps is having daily N&V, was sent home from work the other day d/t emesis.   - prochlorperazine (COMPAZINE) 10 MG tablet; Take 1 tablet (10 mg total) by mouth every 6 (six) hours as needed for nausea or vomiting.  Dispense: 30 tablet; Refill: 0  Preterm labor symptoms and general obstetric precautions including but not limited to vaginal bleeding, contractions, leaking of fluid and fetal movement were reviewed in detail with the patient. Please refer to After Visit Summary for other counseling recommendations.  Return in about 2 weeks (around 10/29/2016) for ROB.   Roe Coombsachelle A Tymesha Ditmore, CNM

## 2016-10-15 NOTE — Progress Notes (Signed)
Patient reports good fetal movement and pressure, denies contractions. 

## 2016-10-15 NOTE — Patient Instructions (Addendum)
Third Trimester of Pregnancy The third trimester is from week 29 through week 42, months 7 through 9. This trimester is when your unborn baby (fetus) is growing very fast. At the end of the ninth month, the unborn baby is about 20 inches in length. It weighs about 6-10 pounds. Follow these instructions at home:  Avoid all smoking, herbs, and alcohol. Avoid drugs not approved by your doctor.  Do not use any tobacco products, including cigarettes, chewing tobacco, and electronic cigarettes. If you need help quitting, ask your doctor. You may get counseling or other support to help you quit.  Only take medicine as told by your doctor. Some medicines are safe and some are not during pregnancy.  Exercise only as told by your doctor. Stop exercising if you start having cramps.  Eat regular, healthy meals.  Wear a good support bra if your breasts are tender.  Do not use hot tubs, steam rooms, or saunas.  Wear your seat belt when driving.  Avoid raw meat, uncooked cheese, and liter boxes and soil used by cats.  Take your prenatal vitamins.  Take 1500-2000 milligrams of calcium daily starting at the 20th week of pregnancy until you deliver your baby.  Try taking medicine that helps you poop (stool softener) as needed, and if your doctor approves. Eat more fiber by eating fresh fruit, vegetables, and whole grains. Drink enough fluids to keep your pee (urine) clear or pale yellow.  Take warm water baths (sitz baths) to soothe pain or discomfort caused by hemorrhoids. Use hemorrhoid cream if your doctor approves.  If you have puffy, bulging veins (varicose veins), wear support hose. Raise (elevate) your feet for 15 minutes, 3-4 times a day. Limit salt in your diet.  Avoid heavy lifting, wear low heels, and sit up straight.  Rest with your legs raised if you have leg cramps or low back pain.  Visit your dentist if you have not gone during your pregnancy. Use a soft toothbrush to brush your  teeth. Be gentle when you floss.  You can have sex (intercourse) unless your doctor tells you not to.  Do not travel far distances unless you must. Only do so with your doctor's approval.  Take prenatal classes.  Practice driving to the hospital.  Pack your hospital bag.  Prepare the baby's room.  Go to your doctor visits. Get help if:  You are not sure if you are in labor or if your water has broken.  You are dizzy.  You have mild cramps or pressure in your lower belly (abdominal).  You have a nagging pain in your belly area.  You continue to feel sick to your stomach (nauseous), throw up (vomit), or have watery poop (diarrhea).  You have bad smelling fluid coming from your vagina.  You have pain with peeing (urination). Get help right away if:  You have a fever.  You are leaking fluid from your vagina.  You are spotting or bleeding from your vagina.  You have severe belly cramping or pain.  You lose or gain weight rapidly.  You have trouble catching your breath and have chest pain.  You notice sudden or extreme puffiness (swelling) of your face, hands, ankles, feet, or legs.  You have not felt the baby move in over an hour.  You have severe headaches that do not go away with medicine.  You have vision changes. This information is not intended to replace advice given to you by your health care provider. Make   sure you discuss any questions you have with your health care provider. Document Released: 07/25/2009 Document Revised: 10/06/2015 Document Reviewed: 07/01/2012 Elsevier Interactive Patient Education  2017 Elsevier Inc. Contraception Choices Contraception (birth control) is the use of any methods or devices to prevent pregnancy. Below are some methods to help avoid pregnancy. Hormonal methods  Contraceptive implant. This is a thin, plastic tube containing progesterone hormone. It does not contain estrogen hormone. Your health care provider inserts the  tube in the inner part of the upper arm. The tube can remain in place for up to 3 years. After 3 years, the implant must be removed. The implant prevents the ovaries from releasing an egg (ovulation), thickens the cervical mucus to prevent sperm from entering the uterus, and thins the lining of the inside of the uterus.  Progesterone-only injections. These injections are given every 3 months by your health care provider to prevent pregnancy. This synthetic progesterone hormone stops the ovaries from releasing eggs. It also thickens cervical mucus and changes the uterine lining. This makes it harder for sperm to survive in the uterus.  Birth control pills. These pills contain estrogen and progesterone hormone. They work by preventing the ovaries from releasing eggs (ovulation). They also cause the cervical mucus to thicken, preventing the sperm from entering the uterus. Birth control pills are prescribed by a health care provider.Birth control pills can also be used to treat heavy periods.  Minipill. This type of birth control pill contains only the progesterone hormone. They are taken every day of each month and must be prescribed by your health care provider.  Birth control patch. The patch contains hormones similar to those in birth control pills. It must be changed once a week and is prescribed by a health care provider.  Vaginal ring. The ring contains hormones similar to those in birth control pills. It is left in the vagina for 3 weeks, removed for 1 week, and then a new one is put back in place. The patient must be comfortable inserting and removing the ring from the vagina.A health care provider's prescription is necessary.  Emergency contraception. Emergency contraceptives prevent pregnancy after unprotected sexual intercourse. This pill can be taken right after sex or up to 5 days after unprotected sex. It is most effective the sooner you take the pills after having sexual intercourse. Most  emergency contraceptive pills are available without a prescription. Check with your pharmacist. Do not use emergency contraception as your only form of birth control. Barrier methods  Female condom. This is a thin sheath (latex or rubber) that is worn over the penis during sexual intercourse. It can be used with spermicide to increase effectiveness.  Female condom. This is a soft, loose-fitting sheath that is put into the vagina before sexual intercourse.  Diaphragm. This is a soft, latex, dome-shaped barrier that must be fitted by a health care provider. It is inserted into the vagina, along with a spermicidal jelly. It is inserted before intercourse. The diaphragm should be left in the vagina for 6 to 8 hours after intercourse.  Cervical cap. This is a round, soft, latex or plastic cup that fits over the cervix and must be fitted by a health care provider. The cap can be left in place for up to 48 hours after intercourse.  Sponge. This is a soft, circular piece of polyurethane foam. The sponge has spermicide in it. It is inserted into the vagina after wetting it and before sexual intercourse.  Spermicides. These are chemicals   that kill or block sperm from entering the cervix and uterus. They come in the form of creams, jellies, suppositories, foam, or tablets. They do not require a prescription. They are inserted into the vagina with an applicator before having sexual intercourse. The process must be repeated every time you have sexual intercourse. Intrauterine contraception  Intrauterine device (IUD). This is a T-shaped device that is put in a woman's uterus during a menstrual period to prevent pregnancy. There are 2 types: ? Copper IUD. This type of IUD is wrapped in copper wire and is placed inside the uterus. Copper makes the uterus and fallopian tubes produce a fluid that kills sperm. It can stay in place for 10 years. ? Hormone IUD. This type of IUD contains the hormone progestin (synthetic  progesterone). The hormone thickens the cervical mucus and prevents sperm from entering the uterus, and it also thins the uterine lining to prevent implantation of a fertilized egg. The hormone can weaken or kill the sperm that get into the uterus. It can stay in place for 3-5 years, depending on which type of IUD is used. Permanent methods of contraception  Female tubal ligation. This is when the woman's fallopian tubes are surgically sealed, tied, or blocked to prevent the egg from traveling to the uterus.  Hysteroscopic sterilization. This involves placing a small coil or insert into each fallopian tube. Your doctor uses a technique called hysteroscopy to do the procedure. The device causes scar tissue to form. This results in permanent blockage of the fallopian tubes, so the sperm cannot fertilize the egg. It takes about 3 months after the procedure for the tubes to become blocked. You must use another form of birth control for these 3 months.  Female sterilization. This is when the female has the tubes that carry sperm tied off (vasectomy).This blocks sperm from entering the vagina during sexual intercourse. After the procedure, the man can still ejaculate fluid (semen). Natural planning methods  Natural family planning. This is not having sexual intercourse or using a barrier method (condom, diaphragm, cervical cap) on days the woman could become pregnant.  Calendar method. This is keeping track of the length of each menstrual cycle and identifying when you are fertile.  Ovulation method. This is avoiding sexual intercourse during ovulation.  Symptothermal method. This is avoiding sexual intercourse during ovulation, using a thermometer and ovulation symptoms.  Post-ovulation method. This is timing sexual intercourse after you have ovulated. Regardless of which type or method of contraception you choose, it is important that you use condoms to protect against the transmission of sexually  transmitted infections (STIs). Talk with your health care provider about which form of contraception is most appropriate for you. This information is not intended to replace advice given to you by your health care provider. Make sure you discuss any questions you have with your health care provider. Document Released: 04/30/2005 Document Revised: 10/06/2015 Document Reviewed: 10/23/2012 Elsevier Interactive Patient Education  2017 Elsevier [employer].   AREA PEDIATRIC/FAMILY PRACTICE PHYSICIANS  [facility] [address] [place], [place]  [address] Phone - [phone]   Fax - [phone]  [facility] [address] [place], [place] [address] Phone - [phone]   Fax - [phone]  JACK AMOS [address] [place], [place]  [address] Phone - [phone]   Fax - [phone]  [facility] [address] [place], [place]  [address] Phone - [phone]   Fax -  [phone]  [facility] [address] [place], [place]  [address] Phone - [phone]   Fax - [phone]  [facility] [address], [address] [place], [place]  [address] Phone - [phone]   Fax - [phone]  [facility] [address] [place], [place]  [address] Phone - [phone]   Fax - [phone]  [facility] [address] [place], [place]  [address] Phone - [phone]   Fax - [phone]  [facility] [address] [place], [place]  [address] Phone - [phone]   Fax - [phone] [facility] [address] [place], [place]  [address] Phone - [phone]   Fax - [phone]  [facility] [address] [place], [place]  [address] Phone - [phone]   Fax - [phone]  [facility] [address], [address]  [place] [place], [place]  [address] Phone - [phone]   Fax - [phone]  [facility] [address], [address] [place], [place]  [address] Phone - [phone]   Fax - [phone]  [facility] [address], [address] [place], [place]  [address] Phone - [phone]  [facility] [address] [place], [place]  [address] Phone - [phone]   Fax - [phone]  [facility] [address], [address] [place], [place]  [address] Phone - [phone]   Fax - [phone]  [facility] [address] [place], [place]  [address] Phone - [phone]   Fax - [phone]  [facility] [address] [place], [place]  [address] Phone - [phone]   Fax - [phone]   [facility] [address] [place], [place]  [address] Phone - [phone]   Fax - [phone]  [facility] [address], [address] [place], [place]  [address] Phone - [phone]   Fax - [phone]  DAVID RUBIN [address] [place], [place]  [address] Phone - [phone]   Fax - [phone]  [facility] [address] [place], [place]  [address] Phone - [phone]   Fax - [phone]  [facility] [address] [place], [place]  [address] Phone - [phone]   Fax - [phone] [facility] [address] [place], [place]  [address] Phone - [phone]   Fax - [phone]  [facility] [address] [place], [place]  [address] Phone - [phone]   Fax - [phone]  [facility] [place] [address] [place], [place]  [address] Phone - [phone]   Fax - [phone]  [facility] [place] Wyvonne Lenzharlene Flemming MD [address] [place] [address] Phone [phone]  Fax [phone]

## 2016-10-29 ENCOUNTER — Ambulatory Visit (INDEPENDENT_AMBULATORY_CARE_PROVIDER_SITE_OTHER): Payer: Medicaid Other | Admitting: Certified Nurse Midwife

## 2016-10-29 ENCOUNTER — Encounter: Payer: Self-pay | Admitting: Certified Nurse Midwife

## 2016-10-29 VITALS — BP 137/82 | HR 106 | Wt 155.2 lb

## 2016-10-29 DIAGNOSIS — Z3483 Encounter for supervision of other normal pregnancy, third trimester: Secondary | ICD-10-CM

## 2016-10-29 DIAGNOSIS — O163 Unspecified maternal hypertension, third trimester: Secondary | ICD-10-CM

## 2016-10-29 DIAGNOSIS — Z3493 Encounter for supervision of normal pregnancy, unspecified, third trimester: Secondary | ICD-10-CM

## 2016-10-29 DIAGNOSIS — Z3480 Encounter for supervision of other normal pregnancy, unspecified trimester: Secondary | ICD-10-CM

## 2016-10-29 NOTE — Progress Notes (Signed)
   PRENATAL VISIT NOTE  Subjective:  Audrey Pearson is a 26 y.o. G2P0010 at 30w5dbeing seen today for ongoing prenatal care.  She is currently monitored for the following issues for this low-risk pregnancy and has Chlamydia; Supervision of normal pregnancy, antepartum; Mild intermittent asthma without complication; Subchorionic hematoma in first trimester; Sickle cell trait ([place]; and IBS (irritable bowel syndrome) on her problem list.  Patient reports no complaints.  Contractions: Not present. Vag. Bleeding: None.  Movement: Present. Denies leaking of fluid.   The following portions of the patient's history were reviewed and updated as appropriate: allergies, current medications, past family history, past medical history, past social history, past surgical history and problem list. Problem list updated.  Objective:   Vitals:   10/29/16 1028  BP: 137/82  Pulse: (!) 106  Weight: 155 lb 3.2 oz (70.4 kg)    Fetal Status:     Movement: Present     General:  Alert, oriented and cooperative. Patient is in no acute distress.  Skin: Skin is warm and dry. No rash noted.   Cardiovascular: Normal heart rate noted  Respiratory: Normal respiratory effort, no problems with respiration noted  Abdomen: Soft, gravid, appropriate for gestational age. Pain/Pressure: Present     Pelvic:  Cervical exam deferred        Extremities: Normal range of motion.  Edema: None  Mental Status: Normal mood and affect. Normal behavior. Normal judgment and thought content.   Assessment and Plan:  Pregnancy: G2P0010 at 328w5d1. Supervision of other normal pregnancy, antepartum     Blood pressure slightly elevated from baseline, still normotensive.  Baseline labs obtained.  2. Elevated blood pressure complicating pregnancy in third trimester, antepartum      - Creatinine clearance, urine, 24 hour; Future - Protein, urine, 24 hour; Future - CBC - Protein / creatinine ratio, urine - Comp Met (CMET)  Preterm  labor symptoms and general obstetric precautions including but not limited to vaginal bleeding, contractions, leaking of fluid and fetal movement were reviewed in detail with the patient. Please refer to After Visit Summary for other counseling recommendations.  Return in about 1 week (around 11/05/2016) for ROB.   RaMorene CrockerCNM

## 2016-10-29 NOTE — Progress Notes (Signed)
Patient has pain in certain areas of abdomen- otherwise she is doing well. She is having more pressure as she sits at work and is wondering about her leave.

## 2016-10-30 LAB — CBC
HEMATOCRIT: 33.9 % — AB (ref 34.0–46.6)
Hemoglobin: 11 g/dL — ABNORMAL LOW (ref 11.1–15.9)
MCH: 29.3 pg (ref 26.6–33.0)
MCHC: 32.4 g/dL (ref 31.5–35.7)
MCV: 90 fL (ref 79–97)
Platelets: 205 10*3/uL (ref 150–379)
RBC: 3.75 x10E6/uL — ABNORMAL LOW (ref 3.77–5.28)
RDW: 13.9 % (ref 12.3–15.4)
WBC: 12 10*3/uL — ABNORMAL HIGH (ref 3.4–10.8)

## 2016-10-30 LAB — COMPREHENSIVE METABOLIC PANEL
A/G RATIO: 1.5 (ref 1.2–2.2)
ALBUMIN: 3.4 g/dL — AB (ref 3.5–5.5)
ALT: 22 IU/L (ref 0–32)
AST: 18 IU/L (ref 0–40)
Alkaline Phosphatase: 89 IU/L (ref 39–117)
BUN / CREAT RATIO: 8 — AB (ref 9–23)
BUN: 4 mg/dL — ABNORMAL LOW (ref 6–20)
Bilirubin Total: <0.2 mg/dL (ref 0.0–1.2)
CO2: 20 mmol/L (ref 20–29)
Calcium: 8.9 mg/dL (ref 8.7–10.2)
Chloride: 104 mmol/L (ref 96–106)
Creatinine, Ser: 0.52 mg/dL — ABNORMAL LOW (ref 0.57–1.00)
GFR, EST AFRICAN AMERICAN: 150 mL/min/{1.73_m2} (ref >59)
GFR, EST NON AFRICAN AMERICAN: 130 mL/min/{1.73_m2} (ref >59)
GLOBULIN, TOTAL: 2.2 g/dL (ref 1.5–4.5)
Glucose: 83 mg/dL (ref 65–99)
POTASSIUM: 3.8 mmol/L (ref 3.5–5.2)
Sodium: 137 mmol/L (ref 134–144)
TOTAL PROTEIN: 5.6 g/dL — AB (ref 6.0–8.5)

## 2016-10-30 LAB — PROTEIN / CREATININE RATIO, URINE
Creatinine, Urine: 64.9 mg/dL
PROTEIN UR: 10.1 mg/dL
Protein/Creat Ratio: 156 mg/g creat (ref 0–200)

## 2016-10-31 ENCOUNTER — Inpatient Hospital Stay (HOSPITAL_COMMUNITY)
Admission: AD | Admit: 2016-10-31 | Discharge: 2016-11-01 | Disposition: A | Discharge status: Home / Self Care | Payer: Medicaid Other | Source: Ambulatory Visit | Attending: Obstetrics and Gynecology | Admitting: Obstetrics and Gynecology

## 2016-10-31 DIAGNOSIS — K5909 Other constipation: Secondary | ICD-10-CM | POA: Insufficient documentation

## 2016-10-31 DIAGNOSIS — O99513 Diseases of the respiratory system complicating pregnancy, third trimester: Secondary | ICD-10-CM | POA: Diagnosis not present

## 2016-10-31 DIAGNOSIS — O26893 Other specified pregnancy related conditions, third trimester: Secondary | ICD-10-CM | POA: Diagnosis not present

## 2016-10-31 DIAGNOSIS — Z79899 Other long term (current) drug therapy: Secondary | ICD-10-CM | POA: Diagnosis not present

## 2016-10-31 DIAGNOSIS — F17210 Nicotine dependence, cigarettes, uncomplicated: Secondary | ICD-10-CM | POA: Insufficient documentation

## 2016-10-31 DIAGNOSIS — O4700 False labor before 37 completed weeks of gestation, unspecified trimester: Secondary | ICD-10-CM

## 2016-10-31 DIAGNOSIS — N898 Other specified noninflammatory disorders of vagina: Secondary | ICD-10-CM | POA: Insufficient documentation

## 2016-10-31 DIAGNOSIS — R110 Nausea: Secondary | ICD-10-CM | POA: Insufficient documentation

## 2016-10-31 DIAGNOSIS — Z803 Family history of malignant neoplasm of breast: Secondary | ICD-10-CM | POA: Insufficient documentation

## 2016-10-31 DIAGNOSIS — K581 Irritable bowel syndrome with constipation: Secondary | ICD-10-CM | POA: Diagnosis not present

## 2016-10-31 DIAGNOSIS — O479 False labor, unspecified: Secondary | ICD-10-CM | POA: Diagnosis not present

## 2016-10-31 DIAGNOSIS — R109 Unspecified abdominal pain: Secondary | ICD-10-CM | POA: Insufficient documentation

## 2016-10-31 DIAGNOSIS — J45909 Unspecified asthma, uncomplicated: Secondary | ICD-10-CM | POA: Diagnosis not present

## 2016-10-31 DIAGNOSIS — D573 Sickle-cell trait: Secondary | ICD-10-CM

## 2016-10-31 DIAGNOSIS — Z3A33 33 weeks gestation of pregnancy: Secondary | ICD-10-CM | POA: Insufficient documentation

## 2016-10-31 DIAGNOSIS — O99333 Smoking (tobacco) complicating pregnancy, third trimester: Secondary | ICD-10-CM | POA: Diagnosis not present

## 2016-10-31 LAB — URINALYSIS, ROUTINE W REFLEX MICROSCOPIC
Bilirubin Urine: NEGATIVE
Glucose, UA: NEGATIVE mg/dL
HGB URINE DIPSTICK: NEGATIVE
Ketones, ur: NEGATIVE mg/dL
LEUKOCYTES UA: NEGATIVE
NITRITE: NEGATIVE
PROTEIN: NEGATIVE mg/dL
Specific Gravity, Urine: 1.004 — ABNORMAL LOW (ref 1.005–1.030)
pH: 6 (ref 5.0–8.0)

## 2016-10-31 LAB — WET PREP, GENITAL
Clue Cells Wet Prep HPF POC: NONE SEEN
Sperm: NONE SEEN
Trich, Wet Prep: NONE SEEN
YEAST WET PREP: NONE SEEN

## 2016-10-31 LAB — AMNISURE RUPTURE OF MEMBRANE (ROM) NOT AT ARMC: Amnisure ROM: NEGATIVE

## 2016-10-31 LAB — FETAL FIBRONECTIN: FETAL FIBRONECTIN: NEGATIVE

## 2016-10-31 NOTE — Discharge Instructions (Signed)

## 2016-10-31 NOTE — MAU Note (Signed)
Braxton hicks but whole stomach getting tight and felt like they were too close.  Seemed like every 5-10 min since 9 pm. No bleeding. ? Leaking fluid, clear since this morning. Baby moving well.

## 2016-10-31 NOTE — MAU Provider Note (Signed)
History     CSN: [id number]  Arrival date and time: 10/31/16 2140   First Provider Initiated Contact with Patient 10/31/16 2240      Chief Complaint  Patient presents with  . Back Pain  . Abdominal Pain  . Contractions   Patient is a 26 year old G2 P0 at 33 weeks and 0 days who presents today with concerns for contractions. She was working in her job at a call center and was noticing some nausea as well as some tightening in the abdomen. She reports it is coming about every 10 minutes. She went to her supervisor and she recommended that the patient, in to be evaluated. Patient reports that the symptoms she arrived here they seem to have slacked off some. She was having low back pain that was radiating around to her abdomen. Additionally she had 2 episodes today of a thin white mucus that was dripping down her leg. She reports regular fetal movement and denies any vaginal bleeding. She has no pain with urination. She does report problems with chronic constipation as she has IBS with constipation.    OB History    Gravida Para Term Preterm AB Living   2 0 0 0 1 0   SAB TAB Ectopic Multiple Live Births   1 0 0 0 0      Past Medical History:  Diagnosis Date  . Asthma   . IBS (irritable bowel syndrome)     Past Surgical History:  Procedure Laterality Date  . NO PAST SURGERIES      Family History  Problem Relation Age of Onset  . Adopted: Yes  . Breast cancer Mother     Social History  Substance Use Topics  . Smoking status: Current Every Day Smoker    Types: Cigarettes    Last attempt to quit: 05/15/2015  . Smokeless tobacco: Never Used  . Alcohol use No     Comment: occ    Allergies: No Known Allergies  Prescriptions Prior to Admission  Medication Sig Dispense Refill Last Dose  . acetaminophen (TYLENOL) 500 MG tablet Take 1,000 mg by mouth every 6 (six) hours as needed for moderate pain.   Taking  . albuterol (PROVENTIL HFA;VENTOLIN HFA) 108 (90 Base) MCG/ACT  inhaler Inhale 2 puffs into the lungs every 6 (six) hours as needed for wheezing or shortness of breath. 18 g 1 Taking  . omeprazole (PRILOSEC) 20 MG capsule Take 1 capsule (20 mg total) by mouth 2 (two) times daily before a meal. 60 capsule 5 Taking  . Prenatal-DSS-FeCb-FeGl-FA (CITRANATAL BLOOM) 90-1 MG TABS Take 1 tablet by mouth daily. 30 tablet 12 Taking  . prochlorperazine (COMPAZINE) 10 MG tablet Take 1 tablet (10 mg total) by mouth every 6 (six) hours as needed for nausea or vomiting. 30 tablet 0 Taking    Review of Systems  Constitutional: Negative for chills and fever.  HENT: Negative for congestion and rhinorrhea.   Respiratory: Negative for cough and shortness of breath.   Gastrointestinal: Positive for abdominal pain, constipation, nausea and vomiting. Negative for abdominal distention and diarrhea.  Genitourinary: Negative for difficulty urinating, dysuria and hematuria.  Neurological: Negative for dizziness and weakness.   Physical Exam   Blood pressure 125/72, pulse (!) 101, temperature 98.2 F (36.8 C), temperature source Oral, resp. rate 16, height 5\' 5"  (1.651 m), weight 160 lb (72.6 kg), last menstrual period 03/12/2016, SpO2 98 %.  Physical Exam  Vitals reviewed. Constitutional: She is oriented to person, place, and time.  She appears well-developed and well-nourished.  HENT:  Head: Normocephalic and atraumatic.  Cardiovascular: Normal rate and intact distal pulses.   Respiratory: Effort normal. No respiratory distress.  GI: Soft. She exhibits no distension. There is no rebound and no guarding.  Minimal abdominal tenderness diffusely.  Musculoskeletal: Normal range of motion. She exhibits no edema.  Neurological: She is alert and oriented to person, place, and time. No cranial nerve deficit.  Skin: Skin is warm and dry. No erythema.  Psychiatric: She has a normal mood and affect. Her behavior is normal.    MAU Course  Procedures  MDM In MA U patient underwent  evaluation with continuous fetal monitoring. She had a baseline fetal heart rate around 140 with moderate variability was reactive for gestational age.   She had wet prep fetal fibronectin and amnisure was performed all of which were within normal limits. Gonorrhea and chlamydia is pending.   Urinalysis was reviewed and within normal limits   New discharge several contractions were noted on the fetal heart tracing strip. A dose of Procardia was given.   Contractions did space out and patient is not feeling them at this time.  Assessment and Plan  #1: Vaginal discharge pregnancy: Likely physiologic discharge as patient's wet prep and amnisure is negative gonorrhea and Chlamydia still pending  #2: Preterm uterine contractions affect and is negative. Only occasional on the monitor. Given 10 mg of Procardia which improved patient's symptoms.  Ernestina Penna 10/31/2016, 11:57 PM

## 2016-11-01 DIAGNOSIS — O26893 Other specified pregnancy related conditions, third trimester: Secondary | ICD-10-CM | POA: Diagnosis not present

## 2016-11-01 LAB — GC/CHLAMYDIA PROBE AMP (~~LOC~~) NOT AT ARMC
CHLAMYDIA, DNA PROBE: NEGATIVE
Neisseria Gonorrhea: NEGATIVE

## 2016-11-01 MED ORDER — NIFEDIPINE 10 MG PO CAPS
10.0000 mg | ORAL_CAPSULE | Freq: Once | ORAL | Status: AC
Start: 2016-11-01 — End: 2016-11-01
  Administered 2016-11-01: 10 mg via ORAL
  Filled 2016-11-01: qty 1

## 2016-11-02 ENCOUNTER — Telehealth: Payer: Self-pay

## 2016-11-02 DIAGNOSIS — O4700 False labor before 37 completed weeks of gestation, unspecified trimester: Secondary | ICD-10-CM

## 2016-11-02 DIAGNOSIS — O479 False labor, unspecified: Secondary | ICD-10-CM

## 2016-11-02 MED ORDER — NIFEDIPINE ER OSMOTIC RELEASE 30 MG PO TB24: 30.0000 mg | ORAL_TABLET | Freq: Every day | ORAL | 0 refills | Status: DC

## 2016-11-02 NOTE — Telephone Encounter (Signed)
Returned call to follow up with patient after recent hospital visit. Patient stated that she went to work yesterday, even though she was advised not to and had a few contractions at work. Patient states that she is not going to work today, and thus far she feels fine. Notified provider and CNM, advised pt to stay out of work until appt on 11-07-16, pt refused and said that she has to make money. Advised pt of all potential risks and she stated that she understood. Advised to increase fluids and rest and [facility] symptoms get worse. Also advised to keep upcoming appt with our office.

## 2016-11-05 NOTE — Addendum Note (Signed)
Addended by: Burnell BlanksMASHBURN, Jimeka Balan on: 11/05/2016 12:13 PM   Modules accepted: Orders

## 2016-11-06 LAB — CREATININE CLEARANCE, URINE, 24 HOUR
CREAT CLEAR: 124 mL/min (ref 88–128)
CREATININE 24H UR: 950 mg/(24.h) (ref 800–1800)
Creatinine, Ser: 0.53 mg/dL — ABNORMAL LOW (ref 0.57–1.00)
Creatinine, Urine: 47.5 mg/dL
GFR calc Af Amer: 149 mL/min/{1.73_m2} (ref >59)
GFR calc non Af Amer: 130 mL/min/{1.73_m2} (ref >59)

## 2016-11-06 LAB — PROTEIN, URINE, 24 HOUR
PROTEIN 24H UR: 170 mg/(24.h) — AB (ref 30–150)
PROTEIN UR: 8.5 mg/dL

## 2016-11-07 ENCOUNTER — Ambulatory Visit (INDEPENDENT_AMBULATORY_CARE_PROVIDER_SITE_OTHER): Payer: Medicaid Other | Admitting: Certified Nurse Midwife

## 2016-11-07 VITALS — BP 128/77 | HR 101 | Wt 161.6 lb

## 2016-11-07 DIAGNOSIS — Z3483 Encounter for supervision of other normal pregnancy, third trimester: Secondary | ICD-10-CM

## 2016-11-07 DIAGNOSIS — Z3480 Encounter for supervision of other normal pregnancy, unspecified trimester: Secondary | ICD-10-CM

## 2016-11-07 DIAGNOSIS — J4520 Mild intermittent asthma, uncomplicated: Secondary | ICD-10-CM

## 2016-11-07 NOTE — Patient Instructions (Addendum)
AREA PEDIATRIC/FAMILY PRACTICE PHYSICIANS  [facility] [address], [address] [place], [place]  [address] Phone - [phone]   Fax - [phone]  [facility] [address] [place], [place] [address] Phone - [phone]   Fax - [phone]  [facility] AMOS [address] [place], [place]  [address] Phone - [phone]   Fax - [phone]  [facility] [address] [place], [place]  [address] Phone - [phone]   Fax - [phone]  [facility] [address] [place], [place]  [address] Phone - [phone]   Fax - [phone]  [facility] [address] [place], [place]  [address] Phone - [phone]   Fax - [phone]  [facility] [address] [place], [place]  [address] Phone - [phone]   Fax - [phone]  [facility] [address], [address] [place], [place]  [address] Phone - [phone]   Fax - [phone]  [facility] [address] [place], [place]  [address] Phone - [phone]   Fax - [phone] [facility] [address] [place], [place]  [address] Phone - [phone]   Fax - [phone]  [facility] [address] [place], [place]  [address] Phone - [phone]   Fax - [phone]  [facility] [address] [place], [place]  [address] Phone - [phone]   Fax - [phone]  [facility] [address], [address] [place], [place]  [address] Phone - [phone]   Fax - [phone]  SHILPA GOSRANI [address] [place], [place]  [address] Phone - [phone]  [facility] [address] [place], [place]  [address] Phone - [phone]   Fax - [phone]  [facility] [address] [place], [place]  [address] Phone - [phone]   Fax - [phone]  [facility] [address] [place], [place]  [address] Phone - [phone]   Fax - [phone]  [facility] [address] [place], [place]  [address] Phone - [phone]   Fax - [phone]   [facility] [address] [place], [place]  [address] Phone - [phone]   Fax - [phone]  [facility] [address] [place], [place]  [address] Phone - [phone]   Fax - [phone]  DAVID RUBIN [address] , [place]  [address] Phone - [phone]   Fax - [phone]  [facility] [address] , [place]  [address] Phone - [phone]   Fax - [phone]  [facility] [address] , [place]  [address] Phone - [phone]   Fax - [phone] [facility] [address] [place], [place]  [address] Phone - [phone]   Fax - [phone]  [facility] [address] [place], [place]  [address] Phone - [phone]   Fax - [phone]  [facility] [address] [place], [place]  [address] Phone - [phone]   Fax - [phone]  [facility] Charlene Flemming MD [address] [place]  [address] Phone [phone]  Fax [phone]  Contraception Choices Contraception (birth control) is the use of any methods or devices to prevent   pregnancy. Below are some methods to help avoid pregnancy. Hormonal methods  Contraceptive implant. This is a thin, plastic tube containing progesterone hormone. It does not contain estrogen hormone. Your health care provider inserts the tube in the inner part of the upper arm. The tube can remain in place for up to 3 years. After 3 years, the implant must be removed. The implant prevents the  ovaries from releasing an egg (ovulation), thickens the cervical mucus to prevent sperm from entering the uterus, and thins the lining of the inside of the uterus.  Progesterone-only injections. These injections are given every 3 months by your health care provider to prevent pregnancy. This synthetic progesterone hormone stops the ovaries from releasing eggs. It also thickens cervical mucus and changes the uterine lining. This makes it harder for sperm to survive in the uterus.  Birth control pills. These pills contain estrogen and progesterone hormone. They work by preventing the ovaries from releasing eggs (ovulation). They also cause the cervical mucus to thicken, preventing the sperm from entering the uterus. Birth control pills are prescribed by a health care provider.Birth control pills can also be used to treat heavy periods.  Minipill. This type of birth control pill contains only the progesterone hormone. They are taken every day of each month and must be prescribed by your health care provider.  Birth control patch. The patch contains hormones similar to those in birth control pills. It must be changed once a week and is prescribed by a health care provider.  Vaginal ring. The ring contains hormones similar to those in birth control pills. It is left in the vagina for 3 weeks, removed for 1 week, and then a new one is put back in place. The patient must be comfortable inserting and removing the ring from the vagina.A health care provider's prescription is necessary.  Emergency contraception. Emergency contraceptives prevent pregnancy after unprotected sexual intercourse. This pill can be taken right after sex or up to 5 days after unprotected sex. It is most effective the sooner you take the pills after having sexual intercourse. Most emergency contraceptive pills are available without a prescription. Check with your pharmacist. Do not use emergency contraception as your only form of birth  control. Barrier methods  Female condom. This is a thin sheath (latex or rubber) that is worn over the penis during sexual intercourse. It can be used with spermicide to increase effectiveness.  Female condom. This is a soft, loose-fitting sheath that is put into the vagina before sexual intercourse.  Diaphragm. This is a soft, latex, dome-shaped barrier that must be fitted by a health care provider. It is inserted into the vagina, along with a spermicidal jelly. It is inserted before intercourse. The diaphragm should be left in the vagina for 6 to 8 hours after intercourse.  Cervical cap. This is a round, soft, latex or plastic cup that fits over the cervix and must be fitted by a health care provider. The cap can be left in place for up to 48 hours after intercourse.  Sponge. This is a soft, circular piece of polyurethane foam. The sponge has spermicide in it. It is inserted into the vagina after wetting it and before sexual intercourse.  Spermicides. These are chemicals that kill or block sperm from entering the cervix and uterus. They come in the form of creams, jellies, suppositories, foam, or tablets. They do not require a prescription. They are inserted into the vagina with an applicator before having sexual intercourse. The process   must be repeated every time you have sexual intercourse. Intrauterine contraception  Intrauterine device (IUD). This is a T-shaped device that is put in a woman's uterus during a menstrual period to prevent pregnancy. There are 2 types: ? Copper IUD. This type of IUD is wrapped in copper wire and is placed inside the uterus. Copper makes the uterus and fallopian tubes produce a fluid that kills sperm. It can stay in place for 10 years. ? Hormone IUD. This type of IUD contains the hormone progestin (synthetic progesterone). The hormone thickens the cervical mucus and prevents sperm from entering the uterus, and it also thins the uterine lining to prevent  implantation of a fertilized egg. The hormone can weaken or kill the sperm that get into the uterus. It can stay in place for 3-5 years, depending on which type of IUD is used. Permanent methods of contraception  Female tubal ligation. This is when the woman's fallopian tubes are surgically sealed, tied, or blocked to prevent the egg from traveling to the uterus.  Hysteroscopic sterilization. This involves placing a small coil or insert into each fallopian tube. Your doctor uses a technique called hysteroscopy to do the procedure. The device causes scar tissue to form. This results in permanent blockage of the fallopian tubes, so the sperm cannot fertilize the egg. It takes about 3 months after the procedure for the tubes to become blocked. You must use another form of birth control for these 3 months.  Female sterilization. This is when the female has the tubes that carry sperm tied off (vasectomy).This blocks sperm from entering the vagina during sexual intercourse. After the procedure, the man can still ejaculate fluid (semen). Natural planning methods  Natural family planning. This is not having sexual intercourse or using a barrier method (condom, diaphragm, cervical cap) on days the woman could become pregnant.  Calendar method. This is keeping track of the length of each menstrual cycle and identifying when you are fertile.  Ovulation method. This is avoiding sexual intercourse during ovulation.  Symptothermal method. This is avoiding sexual intercourse during ovulation, using a thermometer and ovulation symptoms.  Post-ovulation method. This is timing sexual intercourse after you have ovulated. Regardless of which type or method of contraception you choose, it is important that you use condoms to protect against the transmission of sexually transmitted infections (STIs). Talk with your health care provider about which form of contraception is most appropriate for you. This information is not  intended to replace advice given to you by your health care provider. Make sure you discuss any questions you have with your health care provider. Document Released: 04/30/2005 Document Revised: 10/06/2015 Document Reviewed: 10/23/2012 Elsevier Interactive Patient Education  2017 Elsevier Inc.  

## 2016-11-07 NOTE — Progress Notes (Signed)
   PRENATAL VISIT NOTE  Subjective:  Audrey Pearson is a 26 y.o. G2P0010 at 1777w0d being seen today for ongoing prenatal care.  She is currently monitored for the following issues for this low-risk pregnancy and has Chlamydia; Supervision of normal pregnancy, antepartum; Mild intermittent asthma without complication; Subchorionic hematoma in first trimester; Sickle cell trait (HCC); and IBS (irritable bowel syndrome) on her problem list.  Patient reports no bleeding, no leaking and occasional contractions.  Contractions: Irregular. Vag. Bleeding: None.  Movement: Present. Denies leaking of fluid.   The following portions of the patient's history were reviewed and updated as appropriate: allergies, current medications, past family history, past medical history, past social history, past surgical history and problem list. Problem list updated.  Objective:   Vitals:   11/07/16 1109  BP: 128/77  Pulse: (!) 101  Weight: 161 lb 9.6 oz (73.3 kg)    Fetal Status: Fetal Heart Rate (bpm): 140 Fundal Height: 34 cm Movement: Present     General:  Alert, oriented and cooperative. Patient is in no acute distress.  Skin: Skin is warm and dry. No rash noted.   Cardiovascular: Normal heart rate noted  Respiratory: Normal respiratory effort, no problems with respiration noted  Abdomen: Soft, gravid, appropriate for gestational age. Pain/Pressure: Absent     Pelvic:  Cervical exam deferred        Extremities: Normal range of motion.  Edema: Trace  Mental Status: Normal mood and affect. Normal behavior. Normal judgment and thought content.   Assessment and Plan:  Pregnancy: G2P0010 at 1477w0d  1. Supervision of other normal pregnancy, antepartum     Doing well. MAU note reviewed, contractions have decreased since last Wednesday  2. Mild intermittent asthma without complication     Has albuterol.   Preterm labor symptoms and general obstetric precautions including but not limited to vaginal bleeding,  contractions, leaking of fluid and fetal movement were reviewed in detail with the patient. Please refer to After Visit Summary for other counseling recommendations.  Return in about 1 week (around 11/14/2016) for ROB, GBS.   Roe Coombsachelle A Ellena Kamen, CNM

## 2016-11-13 ENCOUNTER — Ambulatory Visit (INDEPENDENT_AMBULATORY_CARE_PROVIDER_SITE_OTHER): Payer: Medicaid Other | Admitting: Obstetrics

## 2016-11-13 ENCOUNTER — Encounter: Payer: Self-pay | Admitting: Obstetrics

## 2016-11-13 DIAGNOSIS — Z3483 Encounter for supervision of other normal pregnancy, third trimester: Secondary | ICD-10-CM

## 2016-11-13 DIAGNOSIS — Z3480 Encounter for supervision of other normal pregnancy, unspecified trimester: Secondary | ICD-10-CM

## 2016-11-13 NOTE — Progress Notes (Signed)
Subjective:  Audrey Pearson is a 26 y.o. G2P0010 at 528w6d being seen today for ongoing prenatal care.  She is currently monitored for the following issues for this low-risk pregnancy and has Chlamydia; Supervision of normal pregnancy, antepartum; Mild intermittent asthma without complication; Subchorionic hematoma in first trimester; Sickle cell trait (HCC); and IBS (irritable bowel syndrome) on her problem list.  Patient reports pressure.  Contractions: Irregular. Vag. Bleeding: None.  Movement: Present. Denies leaking of fluid.   The following portions of the patient's history were reviewed and updated as appropriate: allergies, current medications, past family history, past medical history, past social history, past surgical history and problem list. Problem list updated.  Objective:   Vitals:   11/13/16 1132  BP: 123/77  Pulse: (!) 102  Weight: 163 lb 1.6 oz (74 kg)    Fetal Status: Fetal Heart Rate (bpm): 140   Movement: Present     General:  Alert, oriented and cooperative. Patient is in no acute distress.  Skin: Skin is warm and dry. No rash noted.   Cardiovascular: Normal heart rate noted  Respiratory: Normal respiratory effort, no problems with respiration noted  Abdomen: Soft, gravid, appropriate for gestational age. Pain/Pressure: Present     Pelvic:  Cervical exam deferred        Extremities: Normal range of motion.  Edema: Trace  Mental Status: Normal mood and affect. Normal behavior. Normal judgment and thought content.   Urinalysis:      Assessment and Plan:  Pregnancy: G2P0010 at 762w6d  1. Supervision of other normal pregnancy, antepartum   Preterm labor symptoms and general obstetric precautions including but not limited to vaginal bleeding, contractions, leaking of fluid and fetal movement were reviewed in detail with the patient. Please refer to After Visit Summary for other counseling recommendations.  Return in about 1 week (around 11/20/2016) for  ROB.   Brock BadHarper, Tavi Hoogendoorn A, MD

## 2016-11-13 NOTE — Progress Notes (Signed)
Patient reports good fetal movement, complains of pressure and occasional contractions.

## 2016-11-15 ENCOUNTER — Encounter (HOSPITAL_COMMUNITY): Payer: Self-pay | Admitting: *Deleted

## 2016-11-15 ENCOUNTER — Inpatient Hospital Stay (HOSPITAL_COMMUNITY): Payer: Medicaid Other

## 2016-11-15 ENCOUNTER — Encounter: Payer: Medicaid Other | Admitting: Obstetrics

## 2016-11-15 ENCOUNTER — Inpatient Hospital Stay (HOSPITAL_COMMUNITY)
Admission: AD | Admit: 2016-11-15 | Discharge: 2016-11-15 | Disposition: A | Discharge status: Home / Self Care | Payer: Medicaid Other | Source: Ambulatory Visit | Attending: Obstetrics and Gynecology | Admitting: Obstetrics and Gynecology

## 2016-11-15 DIAGNOSIS — O99333 Smoking (tobacco) complicating pregnancy, third trimester: Secondary | ICD-10-CM | POA: Diagnosis not present

## 2016-11-15 DIAGNOSIS — Z3A35 35 weeks gestation of pregnancy: Secondary | ICD-10-CM | POA: Diagnosis not present

## 2016-11-15 DIAGNOSIS — O4703 False labor before 37 completed weeks of gestation, third trimester: Secondary | ICD-10-CM

## 2016-11-15 DIAGNOSIS — O288 Other abnormal findings on antenatal screening of mother: Secondary | ICD-10-CM

## 2016-11-15 DIAGNOSIS — F17210 Nicotine dependence, cigarettes, uncomplicated: Secondary | ICD-10-CM | POA: Diagnosis not present

## 2016-11-15 LAB — URINALYSIS, ROUTINE W REFLEX MICROSCOPIC
BILIRUBIN URINE: NEGATIVE
Glucose, UA: NEGATIVE mg/dL
Hgb urine dipstick: NEGATIVE
KETONES UR: NEGATIVE mg/dL
LEUKOCYTES UA: NEGATIVE
NITRITE: NEGATIVE
PH: 5 (ref 5.0–8.0)
PROTEIN: NEGATIVE mg/dL
Specific Gravity, Urine: 1.008 (ref 1.005–1.030)

## 2016-11-15 NOTE — Discharge Instructions (Signed)
Fetal Movement Counts °Patient Name: ________________________________________________ Patient Due Date: ____________________ °What is a fetal movement count? °A fetal movement count is the number of times that you feel your baby move during a certain amount of time. This may also be called a fetal kick count. A fetal movement count is recommended for every pregnant woman. You may be asked to start counting fetal movements as early as week 28 of your pregnancy. °Pay attention to when your baby is most active. You may notice your baby's sleep and wake cycles. You may also notice things that make your baby move more. You should do a fetal movement count: °· When your baby is normally most active. °· At the same time each day. ° °A good time to count movements is while you are resting, after having something to eat and drink. °How do I count fetal movements? °1. Find a quiet, comfortable area. Sit, or lie down on your side. °2. Write down the date, the start time and stop time, and the number of movements that you felt between those two times. Take this information with you to your health care visits. °3. For 2 hours, count kicks, flutters, swishes, rolls, and jabs. You should feel at least 10 movements during 2 hours. °4. You may stop counting after you have felt 10 movements. °5. If you do not feel 10 movements in 2 hours, have something to eat and drink. Then, keep resting and counting for 1 hour. If you feel at least 4 movements during that hour, you may stop counting. °Contact a health care provider if: °· You feel fewer than 4 movements in 2 hours. °· Your baby is not moving like he or she usually does. °Date: ____________ Start time: ____________ Stop time: ____________ Movements: ____________ °Date: ____________ Start time: ____________ Stop time: ____________ Movements: ____________ °Date: ____________ Start time: ____________ Stop time: ____________ Movements: ____________ °Date: ____________ Start time:  ____________ Stop time: ____________ Movements: ____________ °Date: ____________ Start time: ____________ Stop time: ____________ Movements: ____________ °Date: ____________ Start time: ____________ Stop time: ____________ Movements: ____________ °Date: ____________ Start time: ____________ Stop time: ____________ Movements: ____________ °Date: ____________ Start time: ____________ Stop time: ____________ Movements: ____________ °Date: ____________ Start time: ____________ Stop time: ____________ Movements: ____________ °This information is not intended to replace advice given to you by your health care provider. Make sure you discuss any questions you have with your health care provider. °Document Released: 05/30/2006 Document Revised: 12/28/2015 Document Reviewed: 06/09/2015 °Elsevier Interactive Patient Education © 2018 Elsevier Inc. °Braxton Hicks Contractions °Contractions of the uterus can occur throughout pregnancy, but they are not always a sign that you are in labor. You may have practice contractions called Braxton Hicks contractions. These false labor contractions are sometimes confused with true labor. °What are Braxton Hicks contractions? °Braxton Hicks contractions are tightening movements that occur in the muscles of the uterus before labor. Unlike true labor contractions, these contractions do not result in opening (dilation) and thinning of the cervix. Toward the end of pregnancy (32-34 weeks), Braxton Hicks contractions can happen more often and may become stronger. These contractions are sometimes difficult to tell apart from true labor because they can be very uncomfortable. You should not feel embarrassed if you go to the hospital with false labor. °Sometimes, the only way to tell if you are in true labor is for your health care provider to look for changes in the cervix. The health care provider will do a physical exam and may monitor your contractions. If   you are not in true labor, the exam  should show that your cervix is not dilating and your water has not broken. °If there are no prenatal problems or other health problems associated with your pregnancy, it is completely safe for you to be sent home with false labor. You may continue to have Braxton Hicks contractions until you go into true labor. °How can I tell the difference between true labor and false labor? °· Differences °? False labor °? Contractions last 30-70 seconds.: Contractions are usually shorter and not as strong as true labor contractions. °? Contractions become very regular.: Contractions are usually irregular. °? Discomfort is usually felt in the top of the uterus, and it spreads to the lower abdomen and low back.: Contractions are often felt in the front of the lower abdomen and in the groin. °? Contractions do not go away with walking.: Contractions may go away when you walk around or change positions while lying down. °? Contractions usually become more intense and increase in frequency.: Contractions get weaker and are shorter-lasting as time goes on. °? The cervix dilates and gets thinner.: The cervix usually does not dilate or become thin. °Follow these instructions at home: °· Take over-the-counter and prescription medicines only as told by your health care provider. °· Keep up with your usual exercises and follow other instructions from your health care provider. °· Eat and drink lightly if you think you are going into labor. °· If Braxton Hicks contractions are making you uncomfortable: °? Change your position from lying down or resting to walking, or change from walking to resting. °? Sit and rest in a tub of warm water. °? Drink enough fluid to keep your urine clear or pale yellow. Dehydration may cause these contractions. °? Do slow and deep breathing several times an hour. °· Keep all follow-up prenatal visits as told by your health care provider. This is important. °Contact a health care provider if: °· You have a  fever. °· You have continuous pain in your abdomen. °Get help right away if: °· Your contractions become stronger, more regular, and closer together. °· You have fluid leaking or gushing from your vagina. °· You pass blood-tinged mucus (bloody show). °· You have bleeding from your vagina. °· You have low back pain that you never had before. °· You feel your baby’s head pushing down and causing pelvic pressure. °· Your baby is not moving inside you as much as it used to. °Summary °· Contractions that occur before labor are called Braxton Hicks contractions, false labor, or practice contractions. °· Braxton Hicks contractions are usually shorter, weaker, farther apart, and less regular than true labor contractions. True labor contractions usually become progressively stronger and regular and they become more frequent. °· Manage discomfort from Braxton Hicks contractions by changing position, resting in a warm bath, drinking plenty of water, or practicing deep breathing. °This information is not intended to replace advice given to you by your health care provider. Make sure you discuss any questions you have with your health care provider. °Document Released: 04/30/2005 Document Revised: 03/19/2016 Document Reviewed: 03/19/2016 °Elsevier Interactive Patient Education © 2017 Elsevier Inc. ° °

## 2016-11-15 NOTE — MAU Note (Signed)
Arrived via ems with c/o contractions that started tonight at work. When they happen they are an 8/10.  States feels like they have eased off upon arrival to [facility]. Endorses losing mucous plug States was seen a couple weeks ago and treated for preterm labor Denies LOF or VB at this time.  +FM

## 2016-11-15 NOTE — MAU Provider Note (Signed)
History     CSN: [id number]  Arrival date and time: 11/15/16 5284  First Provider Initiated Contact with Patient 11/15/16 1911      Chief Complaint  Patient presents with  . Contractions   HPI Audrey Pearson is a 26 y.o. G2P0010 at [redacted]w[redacted]d who presents via EMS with contractions. Contractions started this evening while she was at work. States they were back to back until arriving to MAU. Has felt 1 contraction since arriving to MAU. Rates pain 8/10 when occurs. Was seen in MAU last month for preterm contractions. Started on procardia. Last took procardia this morning. Denies LOF, dysuria, or vaginal bleeding. No recent intercourse. Positive fetal movement.   OB History    Gravida Para Term Preterm AB Living   2 0 0 0 1 0   SAB TAB Ectopic Multiple Live Births   1 0 0 0 0      Past Medical History:  Diagnosis Date  . Asthma   . IBS (irritable bowel syndrome)     Past Surgical History:  Procedure Laterality Date  . NO PAST SURGERIES      Family History  Problem Relation Age of Onset  . Adopted: Yes  . Breast cancer Mother     Social History  Substance Use Topics  . Smoking status: Current Every Day Smoker    Types: Cigarettes    Last attempt to quit: 05/15/2015  . Smokeless tobacco: Never Used  . Alcohol use No     Comment: occ    Allergies: No Known Allergies  Prescriptions Prior to Admission  Medication Sig Dispense Refill Last Dose  . acetaminophen (TYLENOL) 500 MG tablet Take 1,000 mg by mouth every 6 (six) hours as needed for moderate pain.   Taking  . albuterol (PROVENTIL HFA;VENTOLIN HFA) 108 (90 Base) MCG/ACT inhaler Inhale 2 puffs into the lungs every 6 (six) hours as needed for wheezing or shortness of breath. 18 g 1 Taking  . NIFEdipine (PROCARDIA XL) 30 MG 24 hr tablet Take 1 tablet (30 mg total) by mouth daily. 30 tablet 0 Taking  . omeprazole (PRILOSEC) 20 MG capsule Take 1 capsule (20 mg total) by mouth 2 (two) times daily before a meal. 60 capsule 5  Taking  . Prenatal-DSS-FeCb-FeGl-FA (CITRANATAL BLOOM) 90-1 MG TABS Take 1 tablet by mouth daily. 30 tablet 12 Taking  . prochlorperazine (COMPAZINE) 10 MG tablet Take 1 tablet (10 mg total) by mouth every 6 (six) hours as needed for nausea or vomiting. 30 tablet 0 Taking    Review of Systems  Gastrointestinal: Positive for abdominal pain. Negative for constipation, diarrhea, nausea and vomiting.  Genitourinary: Negative for dysuria, vaginal bleeding and vaginal discharge.   Physical Exam   Blood pressure 127/74, pulse (!) 101, temperature 98.1 F (36.7 C), temperature source Oral, resp. rate 19, last menstrual period 03/12/2016, SpO2 98 %.  Physical Exam  Nursing note and vitals reviewed. Constitutional: She is oriented to person, place, and time. She appears well-developed and well-nourished. No distress.  HENT:  Head: Normocephalic and atraumatic.  Eyes: Conjunctivae are normal. Right eye exhibits no discharge. Left eye exhibits no discharge. No scleral icterus.  Neck: Normal range of motion.  Respiratory: Effort normal. No respiratory distress.  GI: Soft. There is no tenderness.  Neurological: She is alert and oriented to person, place, and time.  Skin: Skin is warm and dry. She is not diaphoretic.  Psychiatric: She has a normal mood and affect. Her behavior is normal. Judgment and  thought content normal.   Dilation: Closed Effacement (%): 70 Station: -1 Exam by:: Judeth HornErin Lawrence, NP  Fetal Tracing:  Baseline: 140 bpm Variability: moderate Accelerations: few 15 x 15, few 10 x 10 Decelerations:none  Toco:few, irregular   MAU Course  Procedures  MDM Cervix closed BPP ordered for NRNST [facility] Cam[facility] Molloy PA   Judeth HornLawrence, Erin, NP 11/15/2016 8:06 PM   2000 - Care assumed from Judeth HornErin Lawrence, NP 8/8 BPP and normal AFI EFM improved just prior to leaving for US   Assessment and Plan  A: SIUP at 857w1d Preterm contractions, third trimester  P: Discharge  home Preterm labor precautions discussed Patient advised to follow-up with [facility] as scheduled for routine prenatal care Patient may return to MAU as needed or if her condition were to change or worsen  Marny LowensteinWenzel, [name], PA-C 11/15/2016 9:42 PM

## 2016-11-21 ENCOUNTER — Ambulatory Visit (INDEPENDENT_AMBULATORY_CARE_PROVIDER_SITE_OTHER): Payer: Medicaid Other | Admitting: Certified Nurse Midwife

## 2016-11-21 ENCOUNTER — Other Ambulatory Visit (HOSPITAL_COMMUNITY)
Admission: RE | Admit: 2016-11-21 | Discharge: 2016-11-21 | Disposition: A | Discharge status: Home / Self Care | Payer: Medicaid Other | Source: Ambulatory Visit | Attending: Certified Nurse Midwife | Admitting: Certified Nurse Midwife

## 2016-11-21 ENCOUNTER — Encounter: Payer: Self-pay | Admitting: *Deleted

## 2016-11-21 DIAGNOSIS — Z3480 Encounter for supervision of other normal pregnancy, unspecified trimester: Secondary | ICD-10-CM

## 2016-11-21 DIAGNOSIS — Z3483 Encounter for supervision of other normal pregnancy, third trimester: Secondary | ICD-10-CM

## 2016-11-21 LAB — OB RESULTS CONSOLE GC/CHLAMYDIA: Gonorrhea: NEGATIVE

## 2016-11-21 LAB — OB RESULTS CONSOLE GBS: STREP GROUP B AG: POSITIVE

## 2016-11-21 NOTE — Progress Notes (Signed)
   PRENATAL VISIT NOTE  Subjective:  Audrey Pearson is a 26 y.o. G2P0010 at 285w0d being seen today for ongoing prenatal care.  She is currently monitored for the following issues for this low-risk pregnancy and has Chlamydia; Supervision of normal pregnancy, antepartum; Mild intermittent asthma without complication; Subchorionic hematoma in first trimester; Sickle cell trait (HCC); and IBS (irritable bowel syndrome) on her problem list.  Patient reports no bleeding, no leaking and occasional contractions.  Contractions: Irregular. Vag. Bleeding: None.  Movement: Present. Denies leaking of fluid.   The following portions of the patient's history were reviewed and updated as appropriate: allergies, current medications, past family history, past medical history, past social history, past surgical history and problem list. Problem list updated.  Objective:   Vitals:   11/21/16 1024 11/21/16 1027  BP: 138/86 132/80  Pulse: 100   Weight: 166 lb 3.2 oz (75.4 kg)     Fetal Status: Fetal Heart Rate (bpm): 148 Fundal Height: 34 cm Movement: Present  Presentation: Vertex  General:  Alert, oriented and cooperative. Patient is in no acute distress.  Skin: Skin is warm and dry. No rash noted.   Cardiovascular: Normal heart rate noted  Respiratory: Normal respiratory effort, no problems with respiration noted  Abdomen: Soft, gravid, appropriate for gestational age. Pain/Pressure: Present     Pelvic:  Cervical exam performed Dilation: Closed Effacement (%): 50 Station: -2  Extremities: Normal range of motion.  Edema: Trace  Mental Status: Normal mood and affect. Normal behavior. Normal judgment and thought content.   Assessment and Plan:  Pregnancy: G2P0010 at 355w0d  1. Supervision of other normal pregnancy, antepartum     Patient left work via ambulance on Thursday for contractions.  Has been stressed since.  Desires to leave work now d/t stress, contractions and pelvic pain making it hard to walk  and perform ADLs.   Preterm labor symptoms and general obstetric precautions including but not limited to vaginal bleeding, contractions, leaking of fluid and fetal movement were reviewed in detail with the patient. Please refer to After Visit Summary for other counseling recommendations.  Return in about 1 week (around 11/28/2016) for ROB.   Roe Coombsachelle A Moksh Loomer, CNM

## 2016-11-22 LAB — CERVICOVAGINAL ANCILLARY ONLY
BACTERIAL VAGINITIS: NEGATIVE
CANDIDA VAGINITIS: NEGATIVE
CHLAMYDIA, DNA PROBE: NEGATIVE
NEISSERIA GONORRHEA: NEGATIVE
TRICH (WINDOWPATH): NEGATIVE

## 2016-11-23 ENCOUNTER — Other Ambulatory Visit: Payer: Self-pay | Admitting: Certified Nurse Midwife

## 2016-11-23 DIAGNOSIS — O99820 Streptococcus B carrier state complicating pregnancy: Secondary | ICD-10-CM

## 2016-11-23 DIAGNOSIS — Z3480 Encounter for supervision of other normal pregnancy, unspecified trimester: Secondary | ICD-10-CM

## 2016-11-23 LAB — STREP GP B NAA: STREP GROUP B AG: POSITIVE — AB

## 2016-11-24 ENCOUNTER — Inpatient Hospital Stay (HOSPITAL_COMMUNITY)
Admission: AD | Admit: 2016-11-24 | Discharge: 2016-11-24 | Disposition: A | Discharge status: Home / Self Care | Payer: Medicaid Other | Source: Ambulatory Visit | Attending: Obstetrics and Gynecology | Admitting: Obstetrics and Gynecology

## 2016-11-24 ENCOUNTER — Encounter (HOSPITAL_COMMUNITY): Payer: Self-pay | Admitting: *Deleted

## 2016-11-24 ENCOUNTER — Inpatient Hospital Stay (HOSPITAL_COMMUNITY): Payer: Medicaid Other

## 2016-11-24 DIAGNOSIS — Z87891 Personal history of nicotine dependence: Secondary | ICD-10-CM | POA: Insufficient documentation

## 2016-11-24 DIAGNOSIS — R0602 Shortness of breath: Secondary | ICD-10-CM | POA: Diagnosis present

## 2016-11-24 DIAGNOSIS — O99513 Diseases of the respiratory system complicating pregnancy, third trimester: Secondary | ICD-10-CM | POA: Insufficient documentation

## 2016-11-24 DIAGNOSIS — J45909 Unspecified asthma, uncomplicated: Secondary | ICD-10-CM | POA: Diagnosis not present

## 2016-11-24 DIAGNOSIS — O9989 Other specified diseases and conditions complicating pregnancy, childbirth and the puerperium: Secondary | ICD-10-CM

## 2016-11-24 DIAGNOSIS — O288 Other abnormal findings on antenatal screening of mother: Secondary | ICD-10-CM

## 2016-11-24 DIAGNOSIS — J45901 Unspecified asthma with (acute) exacerbation: Secondary | ICD-10-CM

## 2016-11-24 DIAGNOSIS — D573 Sickle-cell trait: Secondary | ICD-10-CM | POA: Insufficient documentation

## 2016-11-24 DIAGNOSIS — Z3A36 36 weeks gestation of pregnancy: Secondary | ICD-10-CM | POA: Diagnosis not present

## 2016-11-24 DIAGNOSIS — O99013 Anemia complicating pregnancy, third trimester: Secondary | ICD-10-CM | POA: Insufficient documentation

## 2016-11-24 MED ORDER — ALBUTEROL SULFATE HFA 108 (90 BASE) MCG/ACT IN AERS
2.0000 | INHALATION_SPRAY | Freq: Four times a day (QID) | RESPIRATORY_TRACT | Status: DC
  Filled 2016-11-24: qty 6.7

## 2016-11-24 MED ORDER — ALBUTEROL SULFATE (2.5 MG/3ML) 0.083% IN NEBU
2.5000 mg | INHALATION_SOLUTION | Freq: Once | RESPIRATORY_TRACT | Status: AC
  Administered 2016-11-24: 2.5 mg via RESPIRATORY_TRACT
  Filled 2016-11-24: qty 3

## 2016-11-24 NOTE — MAU Provider Note (Signed)
History   G2P0010 @ 36.3 wks in with SOB and ssthma attack. Pt states left inhailer at work. Pt states she took a nap and when she woke up at 1300 she was having breathing difficulty so she came to hospital.  CSN: [id number]  Arrival date & time 11/24/16  1501   None     Chief Complaint  Patient presents with  . Shortness of Breath    HPI  Past Medical History:  Diagnosis Date  . Asthma   . IBS (irritable bowel syndrome)     Past Surgical History:  Procedure Laterality Date  . NO PAST SURGERIES      Family History  Problem Relation Age of Onset  . Adopted: Yes  . Breast cancer Mother     Social History  Substance Use Topics  . Smoking status: Former Smoker    Types: Cigarettes    Quit date: 05/15/2015  . Smokeless tobacco: Never Used  . Alcohol use No     Comment: occ    OB History    Gravida Para Term Preterm AB Living   2 0 0 0 1 0   SAB TAB Ectopic Multiple Live Births   1 0 0 0 0      Review of Systems  Constitutional: Negative.   HENT: Negative.   Eyes: Negative.   Respiratory: Positive for chest tightness, shortness of breath and wheezing.   Cardiovascular: Negative.   Gastrointestinal: Negative.   Endocrine: Negative.   Genitourinary: Negative.   Musculoskeletal: Negative.   Skin: Negative.   Allergic/Immunologic: Negative.   Neurological: Negative.   Hematological: Negative.   Psychiatric/Behavioral: Negative.     Allergies  Patient has no known allergies.  Home Medications    BP 110/66   Pulse (!) 101   Temp 98.7 F (37.1 C)   Resp 18   LMP 03/12/2016   SpO2 99%   Physical Exam  Constitutional: She is oriented to person, place, and time. She appears well-developed and well-nourished.  HENT:  Head: Normocephalic.  Eyes: Pupils are equal, round, and reactive to light.  Neck: Normal range of motion.  Cardiovascular: Normal rate, regular rhythm, normal heart sounds and intact distal pulses.   Pulmonary/Chest: She has wheezes.   Abdominal: Soft. Bowel sounds are normal.  Musculoskeletal: Normal range of motion.  Neurological: She is alert and oriented to person, place, and time. She has normal reflexes.  Skin: Skin is warm and dry.  Psychiatric: She has a normal mood and affect. Her behavior is normal. Judgment and thought content normal.    MAU Course  Procedures (including critical care time)  Labs Reviewed - No data to display No results found.   1. Sickle cell trait (HCC)    2. Asthma attack   MDM  Lungs clear but tight movement of air noted. VSS, Will get resp therapy to do nebulizer tx. 15:50 pt states breathing much better, no distress, O2 sat 100%. NST non reactive will get BPP. BPP8/8  Will d/c home. albuterol inhaler giver prior to d/c home.

## 2016-11-24 NOTE — Discharge Instructions (Signed)
Fetal Movement Counts Patient Name: ________________________________________________ Patient Due Date: ____________________ What is a fetal movement count? A fetal movement count is the number of times that you feel your baby move during a certain amount of time. This may also be called a fetal kick count. A fetal movement count is recommended for every pregnant woman. You may be asked to start counting fetal movements as early as week 28 of your pregnancy. Pay attention to when your baby is most active. You may notice your baby's sleep and wake cycles. You may also notice things that make your baby move more. You should do a fetal movement count:  When your baby is normally most active.  At the same time each day.  A good time to count movements is while you are resting, after having something to eat and drink. How do I count fetal movements? 1. Find a quiet, comfortable area. Sit, or lie down on your side. 2. Write down the date, the start time and stop time, and the number of movements that you felt between those two times. Take this information with you to your health care visits. 3. For 2 hours, count kicks, flutters, swishes, rolls, and jabs. You should feel at least 10 movements during 2 hours. 4. You may stop counting after you have felt 10 movements. 5. If you do not feel 10 movements in 2 hours, have something to eat and drink. Then, keep resting and counting for 1 hour. If you feel at least 4 movements during that hour, you may stop counting. Contact a health care provider if:  You feel fewer than 4 movements in 2 hours.  Your baby is not moving like he or she usually does. Date: ____________ Start time: ____________ Stop time: ____________ Movements: ____________ Date: ____________ Start time: ____________ Stop time: ____________ Movements: ____________ Date: ____________ Start time: ____________ Stop time: ____________ Movements: ____________ Date: ____________ Start time:  ____________ Stop time: ____________ Movements: ____________ Date: ____________ Start time: ____________ Stop time: ____________ Movements: ____________ Date: ____________ Start time: ____________ Stop time: ____________ Movements: ____________ Date: ____________ Start time: ____________ Stop time: ____________ Movements: ____________ Date: ____________ Start time: ____________ Stop time: ____________ Movements: ____________ Date: ____________ Start time: ____________ Stop time: ____________ Movements: ____________ This information is not intended to replace advice given to you by your health care provider. Make sure you discuss any questions you have with your health care provider. Document Released: 05/30/2006 Document Revised: 12/28/2015 Document Reviewed: 06/09/2015 Elsevier Interactive Patient Education  2018 [employer]. Asthma, Adult Asthma is a condition of the lungs in which the airways tighten and narrow. Asthma can make it hard to breathe. Asthma cannot be cured, but medicine and lifestyle changes can help control it. Asthma may be started (triggered) by:  Animal skin flakes (dander).  Dust.  Cockroaches.  Pollen.  Mold.  Smoke.  Cleaning products.  Hair sprays or aerosol sprays.  Paint fumes or strong smells.  Cold air, weather changes, and winds.  Crying or laughing hard.  Stress.  Certain medicines or drugs.  Foods, such as dried fruit, potato chips, and sparkling grape juice.  Infections or conditions (colds, flu).  Exercise.  Certain medical conditions or diseases.  Exercise or tiring activities.  Follow these instructions at home:  Take medicine as told by your doctor.  Use a peak flow meter as told by your doctor. A peak flow meter is a tool that measures how well the lungs are working.  Record and keep track of the  peak flow meter's readings.  Understand and use the asthma action plan. An asthma action plan is a written plan for taking care  of your asthma and treating your attacks.  To help prevent asthma attacks: ? Do not smoke. Stay away from secondhand smoke. ? Change your heating and air conditioning filter often. ? Limit your use of fireplaces and wood stoves. ? Get rid of pests (such as roaches and mice) and their droppings. ? Throw away plants if you see mold on them. ? Clean your floors. Dust regularly. Use cleaning products that do not smell. ? Have someone vacuum when you are not home. Use a vacuum cleaner with a HEPA filter if possible. ? Replace carpet with wood, tile, or vinyl flooring. Carpet can trap animal skin flakes and dust. ? Use allergy-proof pillows, mattress covers, and box spring covers. ? Wash bed sheets and blankets every week in hot water and dry them in a dryer. ? Use blankets that are made of polyester or cotton. ? Clean bathrooms and kitchens with bleach. If possible, have someone repaint the walls in these rooms with mold-resistant paint. Keep out of the rooms that are being cleaned and painted. ? Wash hands often. Contact a doctor if:  You have make a whistling sound when breaking (wheeze), have shortness of breath, or have a cough even if taking medicine to prevent attacks.  The colored mucus you cough up (sputum) is thicker than usual.  The colored mucus you cough up changes from clear or white to yellow, green, gray, or bloody.  You have problems from the medicine you are taking such as: ? A rash. ? Itching. ? Swelling. ? Trouble breathing.  You need reliever medicines more than 2-3 times a week.  Your peak flow measurement is still at 50-79% of your personal best after following the action plan for 1 hour.  You have a fever. Get help right away if:  You seem to be worse and are not responding to medicine during an asthma attack.  You are short of breath even at rest.  You get short of breath when doing very little activity.  You have trouble eating, drinking, or  talking.  You have chest pain.  You have a fast heartbeat.  Your lips or fingernails start to turn blue.  You are light-headed, dizzy, or faint.  Your peak flow is less than 50% of your personal best. This information is not intended to replace advice given to you by your health care provider. Make sure you discuss any questions you have with your health care provider. Document Released: 10/17/2007 Document Revised: 10/06/2015 Document Reviewed: 11/27/2012 Elsevier Interactive Patient Education  2017 [employer]

## 2016-11-24 NOTE — MAU Note (Signed)
Pt started feeling SOB about 30 min ago,has history of asthma. Did not have inhaler with her came straight here

## 2016-11-28 ENCOUNTER — Ambulatory Visit (INDEPENDENT_AMBULATORY_CARE_PROVIDER_SITE_OTHER): Payer: Medicaid Other | Admitting: Certified Nurse Midwife

## 2016-11-28 VITALS — BP 118/77 | HR 102 | Wt 165.5 lb

## 2016-11-28 DIAGNOSIS — J4541 Moderate persistent asthma with (acute) exacerbation: Secondary | ICD-10-CM

## 2016-11-28 DIAGNOSIS — Z3483 Encounter for supervision of other normal pregnancy, third trimester: Secondary | ICD-10-CM

## 2016-11-28 DIAGNOSIS — O99820 Streptococcus B carrier state complicating pregnancy: Secondary | ICD-10-CM

## 2016-11-28 DIAGNOSIS — Z3480 Encounter for supervision of other normal pregnancy, unspecified trimester: Secondary | ICD-10-CM

## 2016-11-28 MED ORDER — MONTELUKAST SODIUM 10 MG PO TABS: 10.0000 mg | ORAL_TABLET | Freq: Every day | ORAL | 12 refills | Status: DC

## 2016-11-28 MED ORDER — BECLOMETHASONE DIPROP HFA 80 MCG/ACT IN AERB: 2.0000 | INHALATION_SPRAY | Freq: Two times a day (BID) | RESPIRATORY_TRACT | 2 refills | Status: DC

## 2016-11-28 NOTE — Progress Notes (Signed)
Patient reports good fetal movement with contractions and pressure that comes and goes. Patient is complaining of SOB, pt has her inhaler with her and states that she just used it.

## 2016-11-28 NOTE — Progress Notes (Signed)
   PRENATAL VISIT NOTE  Subjective:  Audrey Pearson is a 26 y.o. G2P0010 at 7065w0d being seen today for ongoing prenatal care.  She is currently monitored for the following issues for this low-risk pregnancy and has Chlamydia; Supervision of normal pregnancy, antepartum; Mild intermittent asthma without complication; Subchorionic hematoma in first trimester; Sickle cell trait (HCC); IBS (irritable bowel syndrome); and GBS (group B Streptococcus carrier), +RV culture, currently pregnant on her problem list.  Patient reports no bleeding, no leaking, occasional contractions and recent asthma exacerbation, used inhailer 4 times yesturday.  Contractions: Irregular. Vag. Bleeding: None.  Movement: Present. Denies leaking of fluid.   The following portions of the patient's history were reviewed and updated as appropriate: allergies, current medications, past family history, past medical history, past social history, past surgical history and problem list. Problem list updated.  Objective:   Vitals:   11/28/16 1340  BP: 118/77  Pulse: (!) 102  Weight: 165 lb 8 oz (75.1 kg)    Fetal Status: Fetal Heart Rate (bpm): 142 Fundal Height: 35 cm Movement: Present  Presentation: Vertex  General:  Alert, oriented and cooperative. Patient is in no acute distress.  Skin: Skin is warm and dry. No rash noted.   Cardiovascular: Normal heart rate noted  Respiratory: Normal respiratory effort, no problems with respiration noted  Abdomen: Soft, gravid, appropriate for gestational age.  Pain/Pressure: Present     Pelvic: Cervical exam performed Dilation: Closed Effacement (%): 50 Station: -2  Extremities: Normal range of motion.  Edema: Trace  Mental Status:  Normal mood and affect. Normal behavior. Normal judgment and thought content.   Assessment and Plan:  Pregnancy: G2P0010 at 1265w0d  1. Supervision of other normal pregnancy, antepartum     Doing well  2. GBS (group B Streptococcus carrier), +RV culture,  currently pregnant     PCN for labor/delivery  3. Moderate persistent asthma with exacerbation      - beclomethasone (QVAR REDIHALER) 80 MCG/ACT inhaler; Inhale 2 puffs into the lungs 2 (two) times daily.  Dispense: 10.6 g; Refill: 2 - montelukast (SINGULAIR) 10 MG tablet; Take 1 tablet (10 mg total) by mouth at bedtime.  Dispense: 30 tablet; Refill: 12  Term labor symptoms and general obstetric precautions including but not limited to vaginal bleeding, contractions, leaking of fluid and fetal movement were reviewed in detail with the patient. Please refer to After Visit Summary for other counseling recommendations.  Return in about 1 week (around 12/05/2016) for ROB.   Roe Coombsachelle A Hajira Verhagen, CNM

## 2016-12-04 ENCOUNTER — Ambulatory Visit (INDEPENDENT_AMBULATORY_CARE_PROVIDER_SITE_OTHER): Payer: Medicaid Other | Admitting: Certified Nurse Midwife

## 2016-12-04 VITALS — BP 121/80 | HR 97 | Wt 167.0 lb

## 2016-12-04 DIAGNOSIS — O99820 Streptococcus B carrier state complicating pregnancy: Secondary | ICD-10-CM

## 2016-12-04 DIAGNOSIS — Z3480 Encounter for supervision of other normal pregnancy, unspecified trimester: Secondary | ICD-10-CM

## 2016-12-04 DIAGNOSIS — Z3483 Encounter for supervision of other normal pregnancy, third trimester: Secondary | ICD-10-CM

## 2016-12-04 NOTE — Progress Notes (Signed)
Pt would like cervix check today.  

## 2016-12-04 NOTE — Progress Notes (Signed)
   PRENATAL VISIT NOTE  Subjective:  Audrey Pearson is a 26 y.o. G2P0010 at 5727w6d being seen today for ongoing prenatal care.  She is currently monitored for the following issues for this low-risk pregnancy and has Chlamydia; Supervision of normal pregnancy, antepartum; Mild intermittent asthma without complication; Subchorionic hematoma in first trimester; Sickle cell trait (HCC); IBS (irritable bowel syndrome); and GBS (group B Streptococcus carrier), +RV culture, currently pregnant on her problem list.  Patient reports no complaints.  Contractions: Irregular. Vag. Bleeding: None.  Movement: Present. Denies leaking of fluid.   The following portions of the patient's history were reviewed and updated as appropriate: allergies, current medications, past family history, past medical history, past social history, past surgical history and problem list. Problem list updated.  Objective:   Vitals:   12/04/16 1032  BP: 121/80  Pulse: 97  Weight: 167 lb (75.8 kg)    Fetal Status: Fetal Heart Rate (bpm): 147 Fundal Height: 33 cm Movement: Present  Presentation: Vertex  General:  Alert, oriented and cooperative. Patient is in no acute distress.  Skin: Skin is warm and dry. No rash noted.   Cardiovascular: Normal heart rate noted  Respiratory: Normal respiratory effort, no problems with respiration noted  Abdomen: Soft, gravid, appropriate for gestational age.  Pain/Pressure: Present     Pelvic: Cervical exam performed Dilation: Closed Effacement (%): 50 Station: -2  Extremities: Normal range of motion.     Mental Status:  Normal mood and affect. Normal behavior. Normal judgment and thought content.   Assessment and Plan:  Pregnancy: G2P0010 at 7327w6d  1. Supervision of other normal pregnancy, antepartum     Unchanged cervical exam.    2. GBS (group B Streptococcus carrier), +RV culture, currently pregnant     PCN for labor/delivery  Term labor symptoms and general obstetric precautions  including but not limited to vaginal bleeding, contractions, leaking of fluid and fetal movement were reviewed in detail with the patient. Please refer to After Visit Summary for other counseling recommendations.  Return in about 1 week (around 12/11/2016) for ROB.   Roe Coombsachelle A Darwyn Ponzo, CNM

## 2016-12-07 ENCOUNTER — Inpatient Hospital Stay (HOSPITAL_COMMUNITY)
Admission: AD | Admit: 2016-12-07 | Discharge: 2016-12-07 | Disposition: A | Discharge status: Home / Self Care | Payer: Medicaid Other | Source: Ambulatory Visit | Attending: Obstetrics and Gynecology | Admitting: Obstetrics and Gynecology

## 2016-12-07 ENCOUNTER — Encounter (HOSPITAL_COMMUNITY): Payer: Self-pay

## 2016-12-07 DIAGNOSIS — Z3A38 38 weeks gestation of pregnancy: Secondary | ICD-10-CM | POA: Insufficient documentation

## 2016-12-07 DIAGNOSIS — O479 False labor, unspecified: Secondary | ICD-10-CM

## 2016-12-07 DIAGNOSIS — O471 False labor at or after 37 completed weeks of gestation: Secondary | ICD-10-CM | POA: Insufficient documentation

## 2016-12-07 NOTE — MAU Note (Addendum)
G2P0 @ 38.[redacted] wksga. Presents to triage for contractions. Started 1100 when pt woke up. Denies LOF or bleeding. + FM. EFM applied.   SVE: 1.5/40/-3 vertex  1847: Provider notified. Report status of pt given. Orders received to discharge pt home after gettiing 20 mins of NST  Discharge instructions given with pt understanding. Pt left unit via ambulatory.

## 2016-12-07 NOTE — Discharge Instructions (Signed)

## 2016-12-11 ENCOUNTER — Encounter: Payer: Medicaid Other | Admitting: Certified Nurse Midwife

## 2016-12-12 ENCOUNTER — Ambulatory Visit (INDEPENDENT_AMBULATORY_CARE_PROVIDER_SITE_OTHER): Payer: Medicaid Other | Admitting: Certified Nurse Midwife

## 2016-12-12 ENCOUNTER — Telehealth (HOSPITAL_COMMUNITY): Payer: Self-pay | Admitting: *Deleted

## 2016-12-12 ENCOUNTER — Encounter: Payer: Self-pay | Admitting: Certified Nurse Midwife

## 2016-12-12 ENCOUNTER — Encounter (HOSPITAL_COMMUNITY): Payer: Self-pay | Admitting: *Deleted

## 2016-12-12 VITALS — BP 122/84 | HR 81 | Wt 170.3 lb

## 2016-12-12 DIAGNOSIS — Z3483 Encounter for supervision of other normal pregnancy, third trimester: Secondary | ICD-10-CM

## 2016-12-12 DIAGNOSIS — O99820 Streptococcus B carrier state complicating pregnancy: Secondary | ICD-10-CM

## 2016-12-12 DIAGNOSIS — Z3480 Encounter for supervision of other normal pregnancy, unspecified trimester: Secondary | ICD-10-CM

## 2016-12-12 NOTE — Progress Notes (Signed)
   PRENATAL VISIT NOTE  Subjective:  Audrey Pearson is a 26 y.o. G2P0010 at 10248w0d being seen today for ongoing prenatal care.  She is currently monitored for the following issues for this low-risk pregnancy and has Chlamydia; Supervision of normal pregnancy, antepartum; Mild intermittent asthma without complication; Subchorionic hematoma in first trimester; Sickle cell trait (HCC); IBS (irritable bowel syndrome); and GBS (group B Streptococcus carrier), +RV culture, currently pregnant on her problem list.  Patient reports no bleeding, no leaking, occasional contractions and bee sting to right hand this am, OTC tylenol and ice pack given.  Contractions: Irregular. Vag. Bleeding: None.  Movement: Present. Denies leaking of fluid.   The following portions of the patient's history were reviewed and updated as appropriate: allergies, current medications, past family history, past medical history, past social history, past surgical history and problem list. Problem list updated.  Objective:   Vitals:   12/12/16 0812  BP: 122/84  Pulse: 81  Weight: 170 lb 4.8 oz (77.2 kg)    Fetal Status: Fetal Heart Rate (bpm): 131 Fundal Height: 35 cm Movement: Present  Presentation: Vertex  General:  Alert, oriented and cooperative. Patient is in no acute distress.  Skin: Skin is warm and dry. No rash noted.   Cardiovascular: Normal heart rate noted  Respiratory: Normal respiratory effort, no problems with respiration noted  Abdomen: Soft, gravid, appropriate for gestational age.  Pain/Pressure: Present     Pelvic: Cervical exam performed Dilation: Closed Effacement (%): 50 Station: -2  Extremities: Normal range of motion.  Edema: Trace  Mental Status:  Normal mood and affect. Normal behavior. Normal judgment and thought content.   Assessment and Plan:  Pregnancy: G2P0010 at 7448w0d  1. Supervision of other normal pregnancy, antepartum      Doing well.  Cervix unchanged from previous exam.  IOL scheduled  for 41 weeks.   2. GBS (group B Streptococcus carrier), +RV culture, currently pregnant     PCN for labor/delivery  Term labor symptoms and general obstetric precautions including but not limited to vaginal bleeding, contractions, leaking of fluid and fetal movement were reviewed in detail with the patient. Please refer to After Visit Summary for other counseling recommendations.  Return in about 1 week (around 12/19/2016) for ROB, NST.   Roe Coombsachelle A Icela Glymph, CNM

## 2016-12-12 NOTE — Progress Notes (Signed)
Patient reports good fetal movement with irregular contractions and pressure. Pt states that she was stung by a bee on her right hand this morning when she was walking her dog.

## 2016-12-12 NOTE — Telephone Encounter (Signed)
Preadmission screen  

## 2016-12-17 ENCOUNTER — Inpatient Hospital Stay (EMERGENCY_DEPARTMENT_HOSPITAL)
Admission: AD | Admit: 2016-12-17 | Discharge: 2016-12-18 | Disposition: A | Discharge status: Home / Self Care | Payer: Medicaid Other | Source: Ambulatory Visit | Attending: Obstetrics & Gynecology | Admitting: Obstetrics & Gynecology

## 2016-12-17 ENCOUNTER — Encounter (HOSPITAL_COMMUNITY): Payer: Self-pay | Admitting: *Deleted

## 2016-12-17 DIAGNOSIS — O26893 Other specified pregnancy related conditions, third trimester: Secondary | ICD-10-CM

## 2016-12-17 DIAGNOSIS — Z0371 Encounter for suspected problem with amniotic cavity and membrane ruled out: Secondary | ICD-10-CM

## 2016-12-17 DIAGNOSIS — Z3A39 39 weeks gestation of pregnancy: Secondary | ICD-10-CM

## 2016-12-17 DIAGNOSIS — O479 False labor, unspecified: Secondary | ICD-10-CM

## 2016-12-17 LAB — AMNISURE RUPTURE OF MEMBRANE (ROM) NOT AT ARMC: AMNISURE: NEGATIVE

## 2016-12-17 LAB — POCT FERN TEST: POCT FERN TEST: NEGATIVE

## 2016-12-17 MED ORDER — ACETAMINOPHEN 500 MG PO TABS: 1000.0000 mg | ORAL_TABLET | Freq: Once | ORAL | Status: DC

## 2016-12-17 NOTE — MAU Note (Signed)
Pt presents with contractions on and off thorughout the day. States they got worse around 10pm and are now every 5-10. Pt denies LOF, vaginal bleeding. Reports good fetal movement. States cervix was closed last exam.

## 2016-12-17 NOTE — MAU Note (Signed)
Pt stated she has had some questionable leaking of fluid. Pt is unsure if it is fluid or urine.

## 2016-12-18 ENCOUNTER — Encounter (HOSPITAL_COMMUNITY): Payer: Self-pay

## 2016-12-18 ENCOUNTER — Inpatient Hospital Stay (HOSPITAL_COMMUNITY)
Admission: AD | Admit: 2016-12-18 | Discharge: 2016-12-18 | Disposition: A | Discharge status: Home / Self Care | Payer: Medicaid Other | Source: Ambulatory Visit | Attending: Obstetrics & Gynecology | Admitting: Obstetrics & Gynecology

## 2016-12-18 DIAGNOSIS — O471 False labor at or after 37 completed weeks of gestation: Secondary | ICD-10-CM

## 2016-12-18 DIAGNOSIS — O479 False labor, unspecified: Secondary | ICD-10-CM | POA: Diagnosis not present

## 2016-12-18 MED ORDER — NALBUPHINE HCL 10 MG/ML IJ SOLN
5.0000 mg | Freq: Once | INTRAMUSCULAR | Status: AC
  Administered 2016-12-18: 5 mg via INTRAMUSCULAR
  Filled 2016-12-18: qty 1

## 2016-12-18 MED ORDER — HYDROXYZINE PAMOATE 25 MG PO CAPS: 25.0000 mg | ORAL_CAPSULE | Freq: Every evening | ORAL | 0 refills | Status: DC | PRN

## 2016-12-18 MED ORDER — PROMETHAZINE HCL 25 MG/ML IJ SOLN
12.5000 mg | Freq: Once | INTRAMUSCULAR | Status: AC
  Administered 2016-12-18: 12.5 mg via INTRAMUSCULAR
  Filled 2016-12-18: qty 1

## 2016-12-18 NOTE — MAU Note (Signed)
Second fern collected by provider with pelvic exam was neg.

## 2016-12-18 NOTE — Discharge Instructions (Signed)
Third Trimester of Pregnancy The third trimester is from week 28 through week 40 (months 7 through 9). The third trimester is a time when the unborn baby (fetus) is growing rapidly. At the end of the ninth month, the fetus is about 20 inches in length and weighs 6-10 pounds. Body changes during your third trimester Your body will continue to go through many changes during pregnancy. The changes vary from woman to woman. During the third trimester:  Your weight will continue to increase. You can expect to gain 25-35 pounds (11-16 kg) by the end of the pregnancy.  You may begin to get stretch marks on your hips, abdomen, and breasts.  You may urinate more often because the fetus is moving lower into your pelvis and pressing on your bladder.  You may develop or continue to have heartburn. This is caused by increased hormones that slow down muscles in the digestive tract.  You may develop or continue to have constipation because increased hormones slow digestion and cause the muscles that push waste through your intestines to relax.  You may develop hemorrhoids. These are swollen veins (varicose veins) in the rectum that can itch or be painful.  You may develop swollen, bulging veins (varicose veins) in your legs.  You may have increased body aches in the pelvis, back, or thighs. This is due to weight gain and increased hormones that are relaxing your joints.  You may have changes in your hair. These can include thickening of your hair, rapid growth, and changes in texture. Some women also have hair loss during or after pregnancy, or hair that feels dry or thin. Your hair will most likely return to normal after your baby is born.  Your breasts will continue to grow and they will continue to become tender. A yellow fluid (colostrum) may leak from your breasts. This is the first milk you are producing for your baby.  Your belly button may stick out.  You may notice more swelling in your hands,  face, or ankles.  You may have increased tingling or numbness in your hands, arms, and legs. The skin on your belly may also feel numb.  You may feel short of breath because of your expanding uterus.  You may have more problems sleeping. This can be caused by the size of your belly, increased need to urinate, and an increase in your body's metabolism.  You may notice the fetus "dropping," or moving lower in your abdomen (lightening).  You may have increased vaginal discharge.  You may notice your joints feel loose and you may have pain around your pelvic bone.  What to expect at prenatal visits You will have prenatal exams every 2 weeks until week 36. Then you will have weekly prenatal exams. During a routine prenatal visit:  You will be weighed to make sure you and the baby are growing normally.  Your blood pressure will be taken.  Your abdomen will be measured to track your baby's growth.  The fetal heartbeat will be listened to.  Any test results from the previous visit will be discussed.  You may have a cervical check near your due date to see if your cervix has softened or thinned (effaced).  You will be tested for Group B streptococcus. This happens between 35 and 37 weeks.  Your health care provider may ask you:  What your birth plan is.  How you are feeling.  If you are feeling the baby move.  If you have had   any abnormal symptoms, such as leaking fluid, bleeding, severe headaches, or abdominal cramping.  If you are using any tobacco products, including cigarettes, chewing tobacco, and electronic cigarettes.  If you have any questions.  Other tests or screenings that may be performed during your third trimester include:  Blood tests that check for low iron levels (anemia).  Fetal testing to check the health, activity level, and growth of the fetus. Testing is done if you have certain medical conditions or if there are problems during the  pregnancy.  Nonstress test (NST). This test checks the health of your baby to make sure there are no signs of problems, such as the baby not getting enough oxygen. During this test, a belt is placed around your belly. The baby is made to move, and its heart rate is monitored during movement.  What is false labor? False labor is a condition in which you feel small, irregular tightenings of the muscles in the womb (contractions) that usually go away with rest, changing position, or drinking water. These are called Braxton Hicks contractions. Contractions may last for hours, days, or even weeks before true labor sets in. If contractions come at regular intervals, become more frequent, increase in intensity, or become painful, you should see your health care provider. What are the signs of labor?  Abdominal cramps.  Regular contractions that start at 10 minutes apart and become stronger and more frequent with time.  Contractions that start on the top of the uterus and spread down to the lower abdomen and back.  Increased pelvic pressure and dull back pain.  A watery or bloody mucus discharge that comes from the vagina.  Leaking of amniotic fluid. This is also known as your "water breaking." It could be a slow trickle or a gush. Let your health care provider know if it has a color or strange odor. If you have any of these signs, call your health care provider right away, even if it is before your due date. Follow these instructions at home: Medicines  Follow your health care provider's instructions regarding medicine use. Specific medicines may be either safe or unsafe to take during pregnancy.  Take a prenatal vitamin that contains at least 600 micrograms (mcg) of folic acid.  If you develop constipation, try taking a stool softener if your health care provider approves. Eating and drinking  Eat a balanced diet that includes fresh fruits and vegetables, whole grains, good sources of protein  such as meat, eggs, or tofu, and low-fat dairy. Your health care provider will help you determine the amount of weight gain that is right for you.  Avoid raw meat and uncooked cheese. These carry germs that can cause birth defects in the baby.  If you have low calcium intake from food, talk to your health care provider about whether you should take a daily calcium supplement.  Eat four or five small meals rather than three large meals a day.  Limit foods that are high in fat and processed sugars, such as fried and sweet foods.  To prevent constipation: ? Drink enough fluid to keep your urine clear or pale yellow. ? Eat foods that are high in fiber, such as fresh fruits and vegetables, whole grains, and beans. Activity  Exercise only as directed by your health care provider. Most women can continue their usual exercise routine during pregnancy. Try to exercise for 30 minutes at least 5 days a week. Stop exercising if you experience uterine contractions.  Avoid heavy   lifting.  Do not exercise in extreme heat or humidity, or at high altitudes.  Wear low-heel, comfortable shoes.  Practice good posture.  You may continue to have sex unless your health care provider tells you otherwise. Relieving pain and discomfort  Take frequent breaks and rest with your legs elevated if you have leg cramps or low back pain.  Take warm sitz baths to soothe any pain or discomfort caused by hemorrhoids. Use hemorrhoid cream if your health care provider approves.  Wear a good support bra to prevent discomfort from breast tenderness.  If you develop varicose veins: ? Wear support pantyhose or compression stockings as told by your healthcare provider. ? Elevate your feet for 15 minutes, 3-4 times a day. Prenatal care  Write down your questions. Take them to your prenatal visits.  Keep all your prenatal visits as told by your health care provider. This is important. Safety  Wear your seat belt at  all times when driving.  Make a list of emergency phone numbers, including numbers for family, friends, the hospital, and police and fire departments. General instructions  Avoid cat litter boxes and soil used by cats. These carry germs that can cause birth defects in the baby. If you have a cat, ask someone to clean the litter box for you.  Do not travel far distances unless it is absolutely necessary and only with the approval of your health care provider.  Do not use hot tubs, steam rooms, or saunas.  Do not drink alcohol.  Do not use any products that contain nicotine or tobacco, such as cigarettes and e-cigarettes. If you need help quitting, ask your health care provider.  Do not use any medicinal herbs or unprescribed drugs. These chemicals affect the formation and growth of the baby.  Do not douche or use tampons or scented sanitary pads.  Do not cross your legs for long periods of time.  To prepare for the arrival of your baby: ? Take prenatal classes to understand, practice, and ask questions about labor and delivery. ? Make a trial run to the hospital. ? Visit the hospital and tour the maternity area. ? Arrange for maternity or paternity leave through employers. ? Arrange for family and friends to take care of pets while you are in the hospital. ? Purchase a rear-facing car seat and make sure you know how to install it in your car. ? Pack your hospital bag. ? Prepare the baby's nursery. Make sure to remove all pillows and stuffed animals from the baby's crib to prevent suffocation.  Visit your dentist if you have not gone during your pregnancy. Use a soft toothbrush to brush your teeth and be gentle when you floss. Contact a health care provider if:  You are unsure if you are in labor or if your water has broken.  You become dizzy.  You have mild pelvic cramps, pelvic pressure, or nagging pain in your abdominal area.  You have lower back pain.  You have persistent  nausea, vomiting, or diarrhea.  You have an unusual or bad smelling vaginal discharge.  You have pain when you urinate. Get help right away if:  Your water breaks before 37 weeks.  You have regular contractions less than 5 minutes apart before 37 weeks.  You have a fever.  You are leaking fluid from your vagina.  You have spotting or bleeding from your vagina.  You have severe abdominal pain or cramping.  You have rapid weight loss or weight gain.    You have shortness of breath with chest pain.  You notice sudden or extreme swelling of your face, hands, ankles, feet, or legs.  Your baby makes fewer than 10 movements in 2 hours.  You have severe headaches that do not go away when you take medicine.  You have vision changes. Summary  The third trimester is from week 28 through week 40, months 7 through 9. The third trimester is a time when the unborn baby (fetus) is growing rapidly.  During the third trimester, your discomfort may increase as you and your baby continue to gain weight. You may have abdominal, leg, and back pain, sleeping problems, and an increased need to urinate.  During the third trimester your breasts will keep growing and they will continue to become tender. A yellow fluid (colostrum) may leak from your breasts. This is the first milk you are producing for your baby.  False labor is a condition in which you feel small, irregular tightenings of the muscles in the womb (contractions) that eventually go away. These are called Braxton Hicks contractions. Contractions may last for hours, days, or even weeks before true labor sets in.  Signs of labor can include: abdominal cramps; regular contractions that start at 10 minutes apart and become stronger and more frequent with time; watery or bloody mucus discharge that comes from the vagina; increased pelvic pressure and dull back pain; and leaking of amniotic fluid. This information is not intended to replace advice  given to you by your health care provider. Make sure you discuss any questions you have with your health care provider. Document Released: 04/24/2001 Document Revised: 10/06/2015 Document Reviewed: 07/01/2012 Elsevier Interactive Patient Education  2017 [employer]  

## 2016-12-18 NOTE — Discharge Instructions (Signed)

## 2016-12-18 NOTE — MAU Note (Signed)
I have communicated with Thressa ShellerHeather Hogan CNM and reviewed vital signs:  Vitals:   12/17/16 2258 12/18/16 0042  BP: 136/69 127/81  Pulse: (!) 109 90  Resp: 18 19  Temp: 98.4 F (36.9 C) 98.1 F (36.7 C)    Vaginal exam:  Dilation: 1 Effacement (%): Thick Cervical Position: Posterior Station: -3 Presentation: Vertex Exam by:: heather hogan cnm,   Also reviewed contraction pattern and that non-stress test is reactive.  It has been documented that patient is contracting every 1-20 (irregular)  minutes with no cervical change over 2 hours not indicating active labor.  Patient denies any other complaints.  Based on this report provider has given order for discharge.  A discharge order and diagnosis entered by a provider.   Labor discharge instructions reviewed with patient.

## 2016-12-18 NOTE — MAU Provider Note (Signed)
S: Audrey Pearson is a 26 y.o. G2P0010 at 4567w6d  who presents to MAU today complaining of leaking of fluid since "earlier today". She denies vaginal bleeding. She endorses contractions. She reports normal fetal movement.    O: BP 136/69 (BP Location: Right Arm)   Pulse (!) 109   Temp 98.4 F (36.9 C) (Oral)   Resp 18   Ht 5\' 6"  (1.676 m)   Wt 168 lb (76.2 kg)   LMP 03/12/2016   SpO2 99%   BMI 27.12 kg/m  GENERAL: Well-developed, well-nourished female in no acute distress.  HEAD: Normocephalic, atraumatic.  CHEST: Normal effort of breathing, regular heart rate ABDOMEN: Soft, nontender, gravid PELVIC: Normal external female genitalia. Vagina is pink and rugated. Cervix with normal contour, no lesions. Normal discharge.  negative pooling.   Cervical exam:  Dilation: 1 Effacement (%): Thick Cervical Position: Posterior Station: -3 Presentation: Vertex Exam by:: Audrey Pearson cnm   Fetal Monitoring: Baseline: 130 Variability: moderate Accelerations: 15x15 Decelerations: none Contractions: irregular  Results for orders placed or performed during the hospital encounter of 12/17/16 (from the past 24 hour(s))  POCT fern test     Status: Normal   Collection Time: 12/17/16 11:30 PM  Result Value Ref Range   POCT Fern Test Negative = intact amniotic membranes   Amnisure rupture of membrane (rom)not at [facility]     Status: None   Collection Time: 12/17/16 11:30 PM  Result Value Ref Range   Amnisure ROM NEGATIVE      A: SIUP at 7667w6d  Membranes intact  P: DC home  Comfort measures reviewed  3rd Trimester precautions  labor precautions  Fetal kick counts RX: none  Return to MAU as needed FU with OB as planned  Follow-up Information    [facility] Follow up.   Specialty:  Obstetrics and Gynecology Contact information: [address], [address] [place] [id number] [phone]           Armando ReichertHogan, Audrey D, CNM 12/18/2016  12:38 AM

## 2016-12-18 NOTE — MAU Note (Signed)
Pt reports contractions every 2-3 mins apart and vaginal bleeding like a period. States she was 1cm earlier tonight. Reports good fetal movement.

## 2016-12-18 NOTE — OB Triage Note (Signed)
I have communicated with Zorita PangH. Hogan, cnm and Dr. Sedalia Mutaox and reviewed vital signs:  Vitals:   12/18/16 0515 12/18/16 0605  BP: 116/69 120/76  Pulse: 85 90  Resp:  18  Temp:  98.3 F (36.8 C)    Vaginal exam:  Dilation: 2 Effacement (%): 70 Cervical Position: Posterior Station: -3 Presentation: Vertex Exam by:: Ileene Rubensiana Mensah RN,   Also reviewed contraction pattern and that non-stress test is reactive.  It has been documented that patient is contracting every 3-784minutes with minimal cervical change over 2 hours not indicating active labor.  Patient c/o of vaginal bleeding, Bloody show noted on SVE.  Pt requested for pain med, pain med administered. Pt also requested to see MD. Dr. Sedalia Mutaox came in to evaluate pt and prescription for visteril given and Based on this report provider has given order for discharge.  A discharge order and diagnosis entered by a provider.   Labor discharge instructions reviewed with patient.

## 2016-12-19 ENCOUNTER — Encounter (HOSPITAL_COMMUNITY): Payer: Self-pay | Admitting: *Deleted

## 2016-12-19 ENCOUNTER — Inpatient Hospital Stay (HOSPITAL_COMMUNITY): Payer: Medicaid Other | Admitting: Anesthesiology

## 2016-12-19 ENCOUNTER — Encounter: Payer: Medicaid Other | Admitting: Certified Nurse Midwife

## 2016-12-19 ENCOUNTER — Inpatient Hospital Stay (HOSPITAL_COMMUNITY)
Admission: AD | Admit: 2016-12-19 | Discharge: 2016-12-22 | DRG: 775 | Disposition: A | Discharge status: Home / Self Care | Payer: Medicaid Other | Source: Ambulatory Visit | Attending: Obstetrics and Gynecology | Admitting: Obstetrics and Gynecology

## 2016-12-19 DIAGNOSIS — O134 Gestational [pregnancy-induced] hypertension without significant proteinuria, complicating childbirth: Secondary | ICD-10-CM | POA: Diagnosis present

## 2016-12-19 DIAGNOSIS — O99820 Streptococcus B carrier state complicating pregnancy: Secondary | ICD-10-CM

## 2016-12-19 DIAGNOSIS — O9902 Anemia complicating childbirth: Secondary | ICD-10-CM | POA: Diagnosis present

## 2016-12-19 DIAGNOSIS — Z87891 Personal history of nicotine dependence: Secondary | ICD-10-CM | POA: Diagnosis not present

## 2016-12-19 DIAGNOSIS — O9952 Diseases of the respiratory system complicating childbirth: Secondary | ICD-10-CM | POA: Diagnosis present

## 2016-12-19 DIAGNOSIS — Z3A40 40 weeks gestation of pregnancy: Secondary | ICD-10-CM | POA: Diagnosis not present

## 2016-12-19 DIAGNOSIS — O99824 Streptococcus B carrier state complicating childbirth: Secondary | ICD-10-CM | POA: Diagnosis present

## 2016-12-19 DIAGNOSIS — J4520 Mild intermittent asthma, uncomplicated: Secondary | ICD-10-CM | POA: Diagnosis present

## 2016-12-19 DIAGNOSIS — O9962 Diseases of the digestive system complicating childbirth: Secondary | ICD-10-CM | POA: Diagnosis present

## 2016-12-19 DIAGNOSIS — O4290 Premature rupture of membranes, unspecified as to length of time between rupture and onset of labor, unspecified weeks of gestation: Secondary | ICD-10-CM | POA: Diagnosis present

## 2016-12-19 DIAGNOSIS — K589 Irritable bowel syndrome without diarrhea: Secondary | ICD-10-CM | POA: Diagnosis present

## 2016-12-19 DIAGNOSIS — D573 Sickle-cell trait: Secondary | ICD-10-CM | POA: Diagnosis present

## 2016-12-19 DIAGNOSIS — O4292 Full-term premature rupture of membranes, unspecified as to length of time between rupture and onset of labor: Principal | ICD-10-CM | POA: Diagnosis present

## 2016-12-19 LAB — CBC
HCT: 36.9 % (ref 36.0–46.0)
Hemoglobin: 12.7 g/dL (ref 12.0–15.0)
MCH: 29.6 pg (ref 26.0–34.0)
MCHC: 34.4 g/dL (ref 30.0–36.0)
MCV: 86 fL (ref 78.0–100.0)
PLATELETS: 184 10*3/uL (ref 150–400)
RBC: 4.29 MIL/uL (ref 3.87–5.11)
RDW: 15.5 % (ref 11.5–15.5)
WBC: 13.7 10*3/uL — AB (ref 4.0–10.5)

## 2016-12-19 LAB — TYPE AND SCREEN
ABO/RH(D): O POS
ANTIBODY SCREEN: NEGATIVE

## 2016-12-19 LAB — POCT FERN TEST: POCT Fern Test: POSITIVE

## 2016-12-19 LAB — ABO/RH: ABO/RH(D): O POS

## 2016-12-19 LAB — SYPHILIS: RPR W/REFLEX TO RPR TITER AND TREPONEMAL ANTIBODIES, TRADITIONAL SCREENING AND DIAGNOSIS ALGORITHM: RPR Ser Ql: NONREACTIVE

## 2016-12-19 MED ORDER — ACETAMINOPHEN 325 MG PO TABS
650.0000 mg | ORAL_TABLET | ORAL | Status: DC | PRN
  Administered 2016-12-20: 650 mg via ORAL
  Filled 2016-12-19: qty 2

## 2016-12-19 MED ORDER — OXYTOCIN 40 UNITS IN LACTATED RINGERS INFUSION - SIMPLE MED
1.0000 m[IU]/min | INTRAVENOUS | Status: DC
  Administered 2016-12-19: 2 m[IU]/min via INTRAVENOUS

## 2016-12-19 MED ORDER — EPHEDRINE 5 MG/ML INJ
10.0000 mg | INTRAVENOUS | Status: DC | PRN
  Filled 2016-12-19: qty 2

## 2016-12-19 MED ORDER — LACTATED RINGERS IV SOLN: 500.0000 mL | Freq: Once | INTRAVENOUS | Status: DC

## 2016-12-19 MED ORDER — LIDOCAINE HCL (PF) 1 % IJ SOLN
INTRAMUSCULAR | Status: DC | PRN
Start: 2016-12-19 — End: 2016-12-20
  Administered 2016-12-19: 9 mL
  Administered 2016-12-19: 4 mL

## 2016-12-19 MED ORDER — FENTANYL 2.5 MCG/ML BUPIVACAINE 1/10 % EPIDURAL INFUSION (WH - ANES): 14.0000 mL/h | INTRAMUSCULAR | Status: DC | PRN

## 2016-12-19 MED ORDER — OXYTOCIN BOLUS FROM INFUSION
500.0000 mL | Freq: Once | INTRAVENOUS | Status: AC
  Administered 2016-12-20: 500 mL via INTRAVENOUS

## 2016-12-19 MED ORDER — LACTATED RINGERS IV SOLN: 500.0000 mL | INTRAVENOUS | Status: DC | PRN

## 2016-12-19 MED ORDER — OXYCODONE-ACETAMINOPHEN 5-325 MG PO TABS
1.0000 | ORAL_TABLET | ORAL | Status: DC | PRN
Start: 2016-12-19 — End: 2016-12-20

## 2016-12-19 MED ORDER — ONDANSETRON HCL 4 MG/2ML IJ SOLN
4.0000 mg | Freq: Four times a day (QID) | INTRAMUSCULAR | Status: DC | PRN
  Administered 2016-12-19: 4 mg via INTRAVENOUS
  Filled 2016-12-19: qty 2

## 2016-12-19 MED ORDER — FENTANYL CITRATE (PF) 100 MCG/2ML IJ SOLN
100.0000 ug | INTRAMUSCULAR | Status: DC | PRN
  Administered 2016-12-19 (×3): 100 ug via INTRAVENOUS
  Filled 2016-12-19 (×3): qty 2

## 2016-12-19 MED ORDER — OXYCODONE-ACETAMINOPHEN 5-325 MG PO TABS: 2.0000 | ORAL_TABLET | ORAL | Status: DC | PRN

## 2016-12-19 MED ORDER — OXYTOCIN 40 UNITS IN LACTATED RINGERS INFUSION - SIMPLE MED
2.5000 [IU]/h | INTRAVENOUS | Status: DC
  Administered 2016-12-20: 2.5 [IU]/h via INTRAVENOUS
  Filled 2016-12-19: qty 1000

## 2016-12-19 MED ORDER — EPHEDRINE 5 MG/ML INJ: 10.0000 mg | INTRAVENOUS | Status: DC | PRN

## 2016-12-19 MED ORDER — LIDOCAINE HCL (PF) 1 % IJ SOLN
30.0000 mL | INTRAMUSCULAR | Status: DC | PRN
  Filled 2016-12-19: qty 30

## 2016-12-19 MED ORDER — FLEET ENEMA 7-19 GM/118ML RE ENEM: 1.0000 | ENEMA | RECTAL | Status: DC | PRN

## 2016-12-19 MED ORDER — PHENYLEPHRINE 40 MCG/ML (10ML) SYRINGE FOR IV PUSH (FOR BLOOD PRESSURE SUPPORT): 80.0000 ug | PREFILLED_SYRINGE | INTRAVENOUS | Status: DC | PRN

## 2016-12-19 MED ORDER — FENTANYL 2.5 MCG/ML BUPIVACAINE 1/10 % EPIDURAL INFUSION (WH - ANES)
14.0000 mL/h | INTRAMUSCULAR | Status: DC | PRN
  Administered 2016-12-19 (×2): 14 mL/h via EPIDURAL
  Filled 2016-12-19 (×2): qty 100

## 2016-12-19 MED ORDER — PENICILLIN G POT IN DEXTROSE 60000 UNIT/ML IV SOLN
3.0000 10*6.[IU] | INTRAVENOUS | Status: DC
  Administered 2016-12-19 (×4): 3 10*6.[IU] via INTRAVENOUS
  Filled 2016-12-19 (×8): qty 50

## 2016-12-19 MED ORDER — DIPHENHYDRAMINE HCL 50 MG/ML IJ SOLN: 12.5000 mg | INTRAMUSCULAR | Status: DC | PRN

## 2016-12-19 MED ORDER — PHENYLEPHRINE 40 MCG/ML (10ML) SYRINGE FOR IV PUSH (FOR BLOOD PRESSURE SUPPORT)
80.0000 ug | PREFILLED_SYRINGE | INTRAVENOUS | Status: DC | PRN
  Filled 2016-12-19: qty 5
  Filled 2016-12-19: qty 10

## 2016-12-19 MED ORDER — SOD CITRATE-CITRIC ACID 500-334 MG/5ML PO SOLN
30.0000 mL | ORAL | Status: DC | PRN
  Administered 2016-12-19: 30 mL via ORAL
  Filled 2016-12-19: qty 15

## 2016-12-19 MED ORDER — TERBUTALINE SULFATE 1 MG/ML IJ SOLN
0.2500 mg | Freq: Once | INTRAMUSCULAR | Status: DC | PRN
  Filled 2016-12-19: qty 1

## 2016-12-19 MED ORDER — LACTATED RINGERS IV SOLN
INTRAVENOUS | Status: DC
  Administered 2016-12-19 (×2): via INTRAVENOUS

## 2016-12-19 MED ORDER — PHENYLEPHRINE 40 MCG/ML (10ML) SYRINGE FOR IV PUSH (FOR BLOOD PRESSURE SUPPORT)
80.0000 ug | PREFILLED_SYRINGE | INTRAVENOUS | Status: DC | PRN
  Filled 2016-12-19: qty 5

## 2016-12-19 MED ORDER — MISOPROSTOL 200 MCG PO TABS
50.0000 ug | ORAL_TABLET | ORAL | Status: DC | PRN
Start: 2016-12-19 — End: 2016-12-20
  Administered 2016-12-19: 50 ug via ORAL
  Filled 2016-12-19: qty 1

## 2016-12-19 MED ORDER — DIPHENHYDRAMINE HCL 50 MG/ML IJ SOLN
12.5000 mg | INTRAMUSCULAR | Status: DC | PRN
  Filled 2016-12-19: qty 1

## 2016-12-19 MED ORDER — TERBUTALINE SULFATE 1 MG/ML IJ SOLN: 0.2500 mg | Freq: Once | INTRAMUSCULAR | Status: DC | PRN

## 2016-12-19 MED ORDER — PENICILLIN G POTASSIUM 5000000 UNITS IJ SOLR
5.0000 10*6.[IU] | Freq: Once | INTRAMUSCULAR | Status: AC
  Administered 2016-12-19: 5 10*6.[IU] via INTRAVENOUS
  Filled 2016-12-19: qty 5

## 2016-12-19 NOTE — Progress Notes (Signed)
Patient ID: Urbano Heirngel S Menard, female   DOB: 12/12/1990, 26 y.o.   MRN: [id number] Labor Progress Note  S: Patient seen & examined for progress of labor. Patient comfortable with epidural.   O: BP 121/72   Pulse (!) 105   Temp 99 F (37.2 C)   Resp 18   Ht 5\' 6"  (1.676 m)   Wt 76.2 kg (168 lb)   LMP 03/12/2016   SpO2 98%   BMI 27.12 kg/m   FHT: cat 2 TOCO: q2-624min, patient looks comfortable during contractions  CVE: Dilation: 7 Effacement (%): 80 Cervical Position: Middle Station: 0 Presentation: Vertex Exam by:: Dr. Talbert ForestShirley  A&P: 26 y.o. G2P0010 7530w0d here for PROM @0345  08/08  S/p foley bulb@ 1300; S/p cytotec x1;Currently on 6 milli-units of pitocin Continue current management Anticipate SVD  [place]il[place] Alegandra Sommers, DO FM Resident PGY-1 12/19/2016 7:38 PM

## 2016-12-19 NOTE — H&P (Signed)
OBSTETRIC ADMISSION HISTORY AND PHYSICAL  Audrey Pearson is a 26 y.o. female G2P0010 with IUP at 4258w0d by US presenting for SROM. She reports +FMs, No LOF, no VB, no blurry vision, persistant headaches or peripheral edema, and RUQ pain.  She plans on breast feeding. She request abstinence for birth control. She find her contractions painful and would like something until the epidural can be placed.  [facility]    Dating: By US --->  Estimated Date of Delivery: 12/19/16  Sono:    @[redacted]w[redacted]d , CWD, normal anatomy, cephalic presentation,  590g, 16%39% EFW   Prenatal History/Complications:  Past Medical History: Past Medical History:  Diagnosis Date  . Asthma   . IBS (irritable bowel syndrome)     Past Surgical History: Past Surgical History:  Procedure Laterality Date  . NO PAST SURGERIES      Obstetrical History: OB History    Gravida Para Term Preterm AB Living   2 0 0 0 1 0   SAB TAB Ectopic Multiple Live Births   1 0 0 0 0      Social History: Social History   Social History  . Marital status: Single    Spouse name: N/A  . Number of children: N/A  . Years of education: N/A   Social History Main Topics  . Smoking status: Former Smoker    Types: Cigarettes    Quit date: 05/15/2015  . Smokeless tobacco: Never Used  . Alcohol use No     Comment: occ  . Drug use: No  . Sexual activity: Yes    Birth control/ protection: None   Other Topics Concern  . None   Social History Narrative  . None    Family History: Family History  Problem Relation Age of Onset  . Adopted: Yes  . Breast cancer Mother     Allergies: No Known Allergies  Prescriptions Prior to Admission  Medication Sig Dispense Refill Last Dose  . hydrOXYzine (VISTARIL) 25 MG capsule Take 1-2 capsules (25-50 mg total) by mouth at bedtime as needed. 30 capsule 0 12/19/2016 at Unknown time  . acetaminophen (TYLENOL) 500 MG tablet Take 1,000 mg by mouth every 6 (six) hours as needed  for mild pain, moderate pain, fever or headache.    Unknown at Unknown time  . albuterol (PROVENTIL HFA;VENTOLIN HFA) 108 (90 Base) MCG/ACT inhaler Inhale 2 puffs into the lungs every 6 (six) hours as needed for wheezing or shortness of breath. 18 g 1 Unknown at Unknown time  . beclomethasone (QVAR REDIHALER) 80 MCG/ACT inhaler Inhale 2 puffs into the lungs 2 (two) times daily. (Patient not taking: Reported on 12/04/2016) 10.6 g 2 Not Taking  . montelukast (SINGULAIR) 10 MG tablet Take 1 tablet (10 mg total) by mouth at bedtime. 30 tablet 12 Unknown at Unknown time  . omeprazole (PRILOSEC) 20 MG capsule Take 1 capsule (20 mg total) by mouth 2 (two) times daily before a meal. (Patient taking differently: Take 20 mg by mouth 2 (two) times daily as needed (for acid reflux). ) 60 capsule 5 Unknown at Unknown time  . Prenatal-DSS-FeCb-FeGl-FA (CITRANATAL BLOOM) 90-1 MG TABS Take 1 tablet by mouth daily. 30 tablet 12 Unknown at Unknown time  . prochlorperazine (COMPAZINE) 10 MG tablet Take 1 tablet (10 mg total) by mouth every 6 (six) hours as needed for nausea or vomiting. 30 tablet 0 Unknown at Unknown time     Review of Systems   All systems  reviewed and negative except as stated in HPI  Blood pressure 131/83, pulse 97, temperature 98.4 F (36.9 C), temperature source Oral, last menstrual period 03/12/2016, SpO2 95 %. General appearance: alert, cooperative and mild distress Lungs: clear to auscultation bilaterally Heart: regular rate and rhythm Abdomen: soft, non-tender; bowel sounds normal Pelvic: defered Extremities: Homans sign is negative, no sign of DVT  Presentation: cephalic Fetal monitoringBaseline: 1353 bpm, Variability: Fair (1-6 bpm), Accelerations: none and Decelerations: Absent Uterine activityDate/time of onset: 345; q3-16mins, duration 40-100 Dilation: 1.5 Effacement (%): 60 Station: -1, -2 Exam by:: lauren fields rn    Prenatal labs: ABO, Rh: O/Positive/-- (01/26  1053) Antibody: Negative (01/26 1053) Rubella: 2.51 (01/26 1053) RPR: Non Reactive (05/18 1030)  HBsAg: Negative (01/26 1053)  HIV:    GBS: Positive (07/11 1206)  1 hr Glucola 94 Genetic screening  normal Anatomy US normal   Prenatal Transfer Tool  Maternal Diabetes: No Genetic Screening: Normal Maternal Ultrasounds/Referrals: Normal Fetal Ultrasounds or other Referrals:  None Maternal Substance Abuse:  No Significant Maternal Medications:  None Significant Maternal Lab Results: None  Results for orders placed or performed during the hospital encounter of 12/19/16 (from the past 24 hour(s))  POCT fern test   Collection Time: 12/19/16  5:03 AM  Result Value Ref Range   POCT Fern Test Positive = ruptured amniotic [place]banes     Patient Active Problem List   Diagnosis Date Noted  . Indication for care in labor or delivery 12/19/2016  . GBS (group B Streptococcus carrier), +RV culture, currently pregnant 11/23/2016  . IBS (irritable bowel syndrome) 08/03/2016  . Sickle cell trait (HCC) 06/16/2016  . Subchorionic hematoma in first trimester 06/15/2016  . Mild intermittent asthma without complication 06/13/2016  . Supervision of normal pregnancy, antepartum 06/08/2016  . Chlamydia 05/04/2016    Assessment/Plan:  Audrey Pearson is a 26 y.o. G2P0010 at [redacted]w[redacted]d here for augmentation of labor after SROM at 0345. Patient is calm and feels well at this time. Wants IV meds for pain management  #Labor:Not in active labor, will augment as necessary  #Pain: IV fentanyl until epidural can be placed  #FWB: Cat 2 - unconcerning; pt getting bolus.  #ID:  GBS + treating with PCN [id number]. q4h thereafter #MOF: breast #MOC:abstinence   Ignacia Marvel, MD  12/19/2016, 5:25 AM    OB FELLOW HISTORY AND PHYSICAL ATTESTATION  I have seen and examined this patient; I agree with above documentation in the resident's note.   PROM. Not in labor. Bishop score < 6. Will start cervical ripening  with cytotec and FB.   Frederik Pear, MD OB Fellow 12/19/2016, 7:31 AM

## 2016-12-19 NOTE — Progress Notes (Signed)
Patient ID: Urbano Heirngel S Schmierer, female   DOB: Nov 22, 1990, 26 y.o.   MRN: [id number]  Comfortable w/ epidural  T 100.1, BP 125/83, other VSS FHR 150s, +accels, early variables occasionally; decreased LTV at times Ctx q 2-5 min w/ Pit @ 616mu/min Cx deferred at present (was 7/80)  IUP@term  Active labor Hx gHTN w/ stable BPs GBS pos  Will check cx soon and increase Pit if no change Watch temp Anticipate SVD  Cam HaiSHAW, Kristina Mcnorton CNM 12/19/2016 9:40 PM

## 2016-12-19 NOTE — Anesthesia Pain Management Evaluation Note (Signed)
  CRNA Pain Management Visit Note  Patient: Audrey Pearson, 26 y.o., female  "Hello I am a member of the anesthesia team at [facility]. We have an anesthesia team available at all times to provide care throughout the hospital, including epidural management and anesthesia for C-section. I don't know your plan for the delivery whether it a natural birth, water birth, IV sedation, nitrous supplementation, doula or epidural, but we want to meet your pain goals."   1.Was your pain managed to your expectations on prior hospitalizations?   No prior hospitalizations  2.What is your expectation for pain management during this hospitalization?     Epidural  3.How can we help you reach that goal? Epidural as soon as order is placed.  Record the patient's initial score and the patient's pain goal.   Pain: 8  Pain Goal: 8 [facility] wants you to be able to say your pain was always managed very well.  Szymon Foiles 12/19/2016

## 2016-12-19 NOTE — Progress Notes (Signed)
Patient ID: Urbano Heirngel S Schofield, female   DOB: August 23, 1990, 26 y.o.   MRN: [id number] Labor Progress Note  S: Patient seen & examined for progress of labor. Patient comfortable when not having contractions and before foley placement.   O: BP 129/83   Pulse 88   Temp 98.4 F (36.9 C) (Oral)   Resp 18   Ht 5\' 6"  (1.676 m)   Wt 76.2 kg (168 lb)   LMP 03/12/2016   SpO2 95%   BMI 27.12 kg/m   FHT: 120 bpm, mod var, +accels, no decels TOCO: irregular, patient looks uncomfortable during contractions  CVE: Dilation: 2 Effacement (%): 70 Cervical Position: Middle Station: -2 Presentation: Vertex Exam by:: Dr SwazilandJordan Saree Krogh  A&P: 26 y.o. G2P0010 8870w0d here for PROM  S/p cytotec x1, foley bulb in place Continue induction management Anticipate SVD  SwazilandJordan Jax Abdelrahman, DO [facility] Resident PGY-1 12/19/2016 9:12 AM

## 2016-12-19 NOTE — Anesthesia Preprocedure Evaluation (Signed)
Anesthesia Evaluation  Patient identified by MRN, date of birth, ID band Patient awake    Reviewed: Allergy & Precautions, NPO status , Patient's Chart, lab work & pertinent test results  Airway Mallampati: II  TM Distance: >3 FB Neck ROM: Full    Dental no notable dental hx. (+) Dental Advisory Given   Pulmonary asthma , former smoker,    Pulmonary exam normal breath sounds clear to auscultation       Cardiovascular negative cardio ROS Normal cardiovascular exam Rhythm:Regular Rate:Normal     Neuro/Psych negative neurological ROS  negative psych ROS   GI/Hepatic Neg liver ROS, IBS   Endo/Other  negative endocrine ROS  Renal/GU negative Renal ROS  negative genitourinary   Musculoskeletal negative musculoskeletal ROS (+)   Abdominal   Peds negative pediatric ROS (+)  Hematology  (+) Sickle cell trait ,   Anesthesia Other Findings   Reproductive/Obstetrics negative OB ROS                             Anesthesia Physical Anesthesia Plan  ASA: II  Anesthesia Plan: Epidural   Post-op Pain Management:    Induction:   PONV Risk Score and Plan:   Airway Management Planned: Natural Airway  Additional Equipment:   Intra-op Plan:   Post-operative Plan:   Informed Consent: I have reviewed the patients History and Physical, chart, labs and discussed the procedure including the risks, benefits and alternatives for the proposed anesthesia with the patient or authorized representative who has indicated his/her understanding and acceptance.     Plan Discussed with:   Anesthesia Plan Comments: (Labs reviewed. Platelets acceptable, patient not taking any blood thinning medications. Risks and benefits discussed with patient, patient expressed understanding and wished to proceed.)        Anesthesia Quick Evaluation

## 2016-12-19 NOTE — Anesthesia Procedure Notes (Signed)
Epidural Patient location during procedure: OB  Staffing Anesthesiologist: Leslye PeerBROCK, Sundi Slevin E Performed: anesthesiologist   Preanesthetic Checklist Completed: patient identified, pre-op evaluation, timeout performed, IV checked, risks and benefits discussed and monitors and equipment checked  Epidural Patient position: sitting Prep: DuraPrep Patient monitoring: continuous pulse ox and blood pressure Approach: midline Location: L3-L4 Injection technique: LOR saline  Needle:  Needle type: Tuohy  Needle gauge: 17 G Needle length: 9 cm Needle insertion depth: 6 cm Catheter size: 19 Gauge Catheter at skin depth: 12 cm Test dose: negative and Other (1% lidocaine)  Additional Notes Patient identified. Risks including, but not limited to, bleeding, infection, nerve damage, paralysis, inadequate analgesia, blood pressure changes, nausea, vomiting, allergic reaction, postpartum back pain, itching, and headache were discussed. Patient expressed understanding and wished to proceed. Sterile prep and drape, including hand hygiene, mask, and sterile gloves were used. The patient was positioned and the spine was prepped. The skin was anesthetized with lidocaine. No paraesthesia or other complication noted. The patient did not experience any signs of intravascular injection such as tinnitus or metallic taste in mouth, nor signs of intrathecal spread such as rapid motor block. Please see nursing notes for vital signs. The patient tolerated the procedure well.   Leslye Peerhomas Chaz Mcglasson, MDReason for block:procedure for pain

## 2016-12-19 NOTE — Progress Notes (Signed)
Labor Progress Note Urbano Heirngel S Allende is a 26 y.o. G2P0010 at 865w0d  S: Patient reports pain is well managed. Ready to push   O:  BP 109/60   Pulse 92   Temp 99.3 F (37.4 C) (Oral)   Resp 18   Ht 5\' 6"  (1.676 m)   Wt 76.2 kg (168 lb)   LMP 03/12/2016   SpO2 98%   BMI 27.12 kg/m  EFM: 155/mod variability  CVE: Dilation: Lip/rim Effacement (%): 100 Cervical Position: Middle Station: +2 Presentation: Vertex Exam by:: Melburn PopperMichelle Williams,    A&P: 26 y.o. G2P0010 2365w0d after SROM #Labor: Progressing well. Ready to try pushing  #Pain: epidural #FWB: cat 1  #GBS positive tx PCN GHTN- well manged (chronic/other problems)  Ignacia MarvelKendrick C Tazaria Dlugosz, MD 11:44 PM

## 2016-12-19 NOTE — MAU Note (Signed)
Pt presents to MAU c/o SROM at 0348 and c/o ctx. Pt reports a brown discharge. Pt states good fetal movement.

## 2016-12-19 NOTE — Progress Notes (Signed)
Patient ID: Audrey Pearson, female   DOB: [birth year], 26 y.o.   MRN: [id number] Labor Progress Note  S: Patient seen & examined for progress of labor. Patient comfortable with epidural.   O: BP (!) 111/50   Pulse 85   Temp 98 F (36.7 C) (Oral)   Resp 18   Ht 5\' 6"  (1.676 m)   Wt 76.2 kg (168 lb)   LMP 03/12/2016   SpO2 98%   BMI 27.12 kg/m   FHT: 140bpm, mod var,  No accels, no decels TOCO: q4-845min, patient looks comfortable during contractions  CVE: Dilation: 4 Effacement (%): 80 Cervical Position: Middle Station: -2, -1 Presentation: Vertex Exam by:: CGoodnight,RN   A&P: 26 y.o. G2P0010 1230w0d here for PROM  S/p foley bulb@ 1300; S/p cytotec x1;Currently on 2 milli-units of pitocin Continue current management Anticipate SVD  Audrey Desjuan Stearns, DO [facility] Resident PGY-1 12/19/2016 5:17 PM

## 2016-12-20 DIAGNOSIS — O99824 Streptococcus B carrier state complicating childbirth: Secondary | ICD-10-CM

## 2016-12-20 DIAGNOSIS — Z3A40 40 weeks gestation of pregnancy: Secondary | ICD-10-CM

## 2016-12-20 LAB — GLUCOSE, CAPILLARY: GLUCOSE-CAPILLARY: 105 mg/dL — AB (ref 65–99)

## 2016-12-20 MED ORDER — PNEUMOCOCCAL VAC POLYVALENT 25 MCG/0.5ML IJ INJ
0.5000 mL | INJECTION | INTRAMUSCULAR | Status: AC
  Administered 2016-12-21: 0.5 mL via INTRAMUSCULAR
  Filled 2016-12-20: qty 0.5

## 2016-12-20 MED ORDER — COCONUT OIL OIL
1.0000 "application " | TOPICAL_OIL | Status: DC | PRN
  Administered 2016-12-22: 1 via TOPICAL
  Filled 2016-12-20: qty 120

## 2016-12-20 MED ORDER — SIMETHICONE 80 MG PO CHEW: 80.0000 mg | CHEWABLE_TABLET | ORAL | Status: DC | PRN

## 2016-12-20 MED ORDER — TETANUS-DIPHTH-ACELL PERTUSSIS 5-2.5-18.5 LF-MCG/0.5 IM SUSP: 0.5000 mL | Freq: Once | INTRAMUSCULAR | Status: DC

## 2016-12-20 MED ORDER — ONDANSETRON HCL 4 MG/2ML IJ SOLN: 4.0000 mg | INTRAMUSCULAR | Status: DC | PRN

## 2016-12-20 MED ORDER — CYCLOBENZAPRINE HCL 10 MG PO TABS
10.0000 mg | ORAL_TABLET | Freq: Once | ORAL | Status: AC
  Administered 2016-12-20: 10 mg via ORAL
  Filled 2016-12-20: qty 1

## 2016-12-20 MED ORDER — OXYCODONE HCL 5 MG PO TABS
5.0000 mg | ORAL_TABLET | Freq: Once | ORAL | Status: AC
  Administered 2016-12-20: 5 mg via ORAL
  Filled 2016-12-20: qty 1

## 2016-12-20 MED ORDER — IBUPROFEN 600 MG PO TABS
600.0000 mg | ORAL_TABLET | Freq: Four times a day (QID) | ORAL | Status: DC
  Administered 2016-12-20 – 2016-12-22 (×9): 600 mg via ORAL
  Filled 2016-12-20 (×9): qty 1

## 2016-12-20 MED ORDER — DIBUCAINE 1 % RE OINT
1.0000 "application " | TOPICAL_OINTMENT | RECTAL | Status: DC | PRN
  Administered 2016-12-20: 1 via RECTAL
  Filled 2016-12-20: qty 28

## 2016-12-20 MED ORDER — SENNOSIDES-DOCUSATE SODIUM 8.6-50 MG PO TABS
2.0000 | ORAL_TABLET | ORAL | Status: DC
  Administered 2016-12-21 (×2): 2 via ORAL
  Filled 2016-12-20 (×2): qty 2

## 2016-12-20 MED ORDER — PRENATAL MULTIVITAMIN CH
1.0000 | ORAL_TABLET | Freq: Every day | ORAL | Status: DC
  Administered 2016-12-20 – 2016-12-21 (×2): 1 via ORAL
  Filled 2016-12-20 (×2): qty 1

## 2016-12-20 MED ORDER — ACETAMINOPHEN 325 MG PO TABS
650.0000 mg | ORAL_TABLET | ORAL | Status: DC | PRN
  Administered 2016-12-20 (×3): 650 mg via ORAL
  Filled 2016-12-20 (×3): qty 2

## 2016-12-20 MED ORDER — BENZOCAINE-MENTHOL 20-0.5 % EX AERO
1.0000 "application " | INHALATION_SPRAY | CUTANEOUS | Status: DC | PRN
  Administered 2016-12-20: 1 via TOPICAL
  Filled 2016-12-20 (×3): qty 56

## 2016-12-20 MED ORDER — ZOLPIDEM TARTRATE 5 MG PO TABS
5.0000 mg | ORAL_TABLET | Freq: Every evening | ORAL | Status: DC | PRN
  Administered 2016-12-21: 5 mg via ORAL
  Filled 2016-12-20: qty 1

## 2016-12-20 MED ORDER — ALBUTEROL SULFATE (2.5 MG/3ML) 0.083% IN NEBU
3.0000 mL | INHALATION_SOLUTION | RESPIRATORY_TRACT | Status: DC | PRN
  Administered 2016-12-20: 3 mL via RESPIRATORY_TRACT
  Filled 2016-12-20: qty 3

## 2016-12-20 MED ORDER — ONDANSETRON HCL 4 MG PO TABS: 4.0000 mg | ORAL_TABLET | ORAL | Status: DC | PRN

## 2016-12-20 MED ORDER — WITCH HAZEL-GLYCERIN EX PADS
1.0000 "application " | MEDICATED_PAD | CUTANEOUS | Status: DC | PRN
  Administered 2016-12-20: 1 via TOPICAL

## 2016-12-20 MED ORDER — DIPHENHYDRAMINE HCL 25 MG PO CAPS: 25.0000 mg | ORAL_CAPSULE | Freq: Four times a day (QID) | ORAL | Status: DC | PRN

## 2016-12-20 NOTE — Plan of Care (Addendum)
Problem: Pain Management: Goal: General experience of comfort will improve and pain level will decrease Outcome: Progressing Pt experiencing pain 8/10 on shift assessment, contacted Dr Cliffton AstersWhite, who came to assess. Determined treatment for pain to be witch hazel and dibucaine ointment at this time   Contacted Dr Cliffton AstersWhite again at 0000, prescribed lidocaine PRN daily, recommended to provide pillow for pt to sit on and sitz bath, which were brought to pt 0100- lidociane applied,  pt stated that pillow was ineffective, that she had "tried to sit on pillow and almost cried"   0200 reassessment- pt no longer appears to be shaking from pain, rates pain "7" out of 10

## 2016-12-20 NOTE — Plan of Care (Signed)
Problem: Activity: Goal: Ability to tolerate increased activity will improve Outcome: Progressing Patient ambulating well in the room and encouraged to continue ambulation and increase ambulation.  Problem: Urinary Elimination: Goal: Ability to reestablish a normal urinary elimination pattern will improve Outcome: Completed/Met Date Met: 12/20/16 Patient voiding well on her own.

## 2016-12-20 NOTE — Lactation Note (Signed)
This note was copied from a baby's chart. Lactation Consultation Note  Patient Name: Audrey Pearson Today's Date: 12/20/2016 Reason for consult: Initial assessment Baby at 14 hr of life. Upon entry Mom was pumping and a visitor was offering expressed milk with a bottle from home. Mom stated the last 2 RN that helped her latch the baby pinched her nipples and now they are sore. She would like to pump to offer her milk. Moved mom up to the #27 flange for the L and #30 flange for the R side. No skin break down or bruising noted on either nipple. Explained the risks of artificial nipples and the expected volumes from pumping in the first 3 days. Suggested that mom offer expressed milk with a spoon or cup and consider latching baby at the next feeding. Discussed baby behavior, feeding frequency, pumping frequency, baby belly size, voids, wt loss, breast changes, and nipple care. Given lactation handouts. Aware of OP services and support group.    Maternal Data Has patient been taught Hand Expression?: Yes Does the patient have breastfeeding experience prior to this delivery?: No  Feeding    LATCH Score                   Interventions    Lactation Tools Discussed/Used Pump Review: Setup, frequency, and cleaning;Milk Storage;Other (comment) (pump settings) Initiated by:: ES Date initiated:: 12/20/16   Consult Status Consult Status: Follow-up Date: 12/21/16 Follow-up type: In-patient    Rulon Eisenmengerlizabeth E Tyreese Thain 12/20/2016, 2:37 PM

## 2016-12-20 NOTE — Anesthesia Postprocedure Evaluation (Signed)
Anesthesia Post Note  Patient: Audrey Pearson  Procedure(s) Performed: * No procedures listed *     Patient location during evaluation: Mother Baby Anesthesia Type: Epidural Level of consciousness: awake Pain management: satisfactory to patient Vital Signs Assessment: post-procedure vital signs reviewed and stable Respiratory status: spontaneous breathing Cardiovascular status: stable Anesthetic complications: no    Last Vitals:  Vitals:   12/20/16 0500 12/20/16 0720  BP:  94/78  Pulse:  84  Resp:  16  Temp: 37.2 C 36.9 C    Last Pain:  Vitals:   12/20/16 0720  TempSrc: Oral  PainSc: 5    Pain Goal:                 KeyCorpBURGER,[name]

## 2016-12-20 NOTE — Progress Notes (Signed)
Patient is still experiencing a 9/10 pain score after tylenol.  RN assessed, pt has not hemorrhoid or lacerations.  Dr. Sedalia Mutaox notified, ordered 5mg  oxy IR and will reassess if pain not improved.

## 2016-12-21 ENCOUNTER — Encounter (HOSPITAL_COMMUNITY): Payer: Self-pay | Admitting: Advanced Practice Midwife

## 2016-12-21 MED ORDER — CYCLOBENZAPRINE HCL 10 MG PO TABS
10.0000 mg | ORAL_TABLET | Freq: Three times a day (TID) | ORAL | Status: DC | PRN
  Administered 2016-12-21 (×3): 10 mg via ORAL
  Filled 2016-12-21 (×4): qty 1

## 2016-12-21 MED ORDER — LIDOCAINE 5 % EX OINT
TOPICAL_OINTMENT | Freq: Every day | CUTANEOUS | Status: DC | PRN
Start: 2016-12-21 — End: 2016-12-22
  Administered 2016-12-21: 1 via TOPICAL
  Filled 2016-12-21: qty 35.44

## 2016-12-21 NOTE — Progress Notes (Signed)
CSW acknowledges consult.  CSW attempted to meet with MOB, however MOB was asleep when CSW arrived. A female guest was present and introduced herself as MOB's friend was caring for infant.    CSW consulted with MOB's bedside nurse and at this time there are no concerns.  Bedsides also has observed visitors while MOB has been inpatient.   CSW received consult for hx of marijuana use.  Referral was screened out due to the following: ~MOB had no documented substance use after initial prenatal visit/+UPT. ~MOB had no positive drug screens after initial prenatal visit/+UPT. ~Baby's UDS is negative.  Please consult CSW if current concerns arise or by MOB's request.  CSW will monitor CDS results and make report to Child Protective Services if warranted.  Blaine HamperAngel Boyd-Gilyard, MSW, LCSW Clinical Social Work [phone]

## 2016-12-21 NOTE — Discharge Summary (Signed)
OB Discharge Summary     Patient Name: Audrey Pearson DOB: [birth year] MRN: [id number]  Date of admission: 12/19/2016 Delivering MD: Joen Laura C   Date of discharge: 12/21/2016  Admitting diagnosis: 40 WEEKS ROM CTX Intrauterine pregnancy: [redacted]w[redacted]d     Secondary diagnosis:  Principal Problem:   PROM (premature rupture of membranes) Active Problems:   GBS (group B Streptococcus carrier), +RV culture, currently pregnant  Additional problems: none     Discharge diagnosis: Term Pregnancy Delivered                                                                                                Post partum procedures:none  Augmentation: Pitocin, Cytotec and Foley Balloon  Complications: None  Hospital course:  Onset of Labor With Vaginal Delivery     26 y.o. yo G2P0010 at [redacted]w[redacted]d was admitted in Latent Labor/SROM that required augmentation on 12/19/2016. Patient had an uncomplicated labor course as follows:  Membrane Rupture Time/Date: 3:48 AM ,12/19/2016   Intrapartum Procedures: Episiotomy: None [1]                                         Lacerations:  None [1]  Patient had a delivery of a Viable infant. 12/20/2016  Information for the patient's newborn:  Mavery, Milling [[id number]]  Delivery Method: Vaginal, Spontaneous Delivery (Filed from Delivery Summary)    Pateint had an uncomplicated postpartum course.  She is ambulating, tolerating a regular diet, passing flatus, and urinating well. Only complaint is rectal/perirectal pain, that has been 7-8/10, constant.Reportedly very tender touch, attempted witch hazel cream, donut hole pillow, hydrocortisone cream, dibucaine ointment, all without much if any improvement of the discomfort. Ice packs help mildly and suggested a sitz bath.    Patient is discharged home in stable condition on 12/21/16.   Physical exam  Vitals:   12/20/16 0720 12/20/16 1808 12/20/16 2120 12/21/16 0601  BP: 94/78 106/64  110/60  Pulse: 84 81 90 75  Resp: 16  18 18 16   Temp: 98.4 F (36.9 C) 98.1 F (36.7 C)  98.7 F (37.1 C)  TempSrc: Oral Oral  Axillary  SpO2:      Weight:      Height:       General: alert and cooperative distressed about hemorrhoidal pain Lochia: appropriate Uterine Fundus: firm  DVT Evaluation: No evidence of DVT seen on physical exam. Labs: Lab Results  Component Value Date   WBC 13.7 (H) 12/19/2016   HGB 12.7 12/19/2016   HCT 36.9 12/19/2016   MCV 86.0 12/19/2016   PLT 184 12/19/2016   CMP Latest Ref Rng & Units 11/05/2016  Glucose 65 - 99 mg/dL -  BUN 6 - 20 mg/dL -  Creatinine 2.72 - 5.36 mg/dL 6.44(I)  Sodium 347 - 425 mmol/L -  Potassium 3.5 - 5.2 mmol/L -  Chloride 96 - 106 mmol/L -  CO2 20 - 29 mmol/L -  Calcium 8.7 - 10.2 mg/dL -  Total Protein 6.0 -  8.5 g/dL -  Total Bilirubin 0.0 - 1.2 mg/dL -  Alkaline Phos 39 - 147117 IU/L -  AST 0 - 40 IU/L -  ALT 0 - 32 IU/L -    Discharge instruction: per After Visit Summary and "Baby and Me Booklet".  After visit meds:  Allergies as of 12/21/2016   No Known Allergies     Medication List    STOP taking these medications   acetaminophen 500 MG tablet Commonly known as:  TYLENOL   CITRANATAL BLOOM 90-1 MG Tabs   hydrOXYzine 25 MG capsule Commonly known as:  VISTARIL   prochlorperazine 10 MG tablet Commonly known as:  COMPAZINE     TAKE these medications   albuterol 108 (90 Base) MCG/ACT inhaler Commonly known as:  PROVENTIL HFA;VENTOLIN HFA Inhale 2 puffs into the lungs every 6 (six) hours as needed for wheezing or shortness of breath.   beclomethasone 80 MCG/ACT inhaler Commonly known as:  QVAR REDIHALER Inhale 2 puffs into the lungs 2 (two) times daily.   montelukast 10 MG tablet Commonly known as:  SINGULAIR Take 1 tablet (10 mg total) by mouth at bedtime. What changed:  when to take this  reasons to take this   omeprazole 20 MG capsule Commonly known as:  PRILOSEC Take 1 capsule (20 mg total) by mouth 2 (two) times daily  before a meal. What changed:  when to take this  reasons to take this       Diet: routine diet  Activity: Advance as tolerated. Pelvic rest for 6 weeks.   Outpatient follow up:4 weeks Follow up Appt:Future Appointments Date Time Provider Department Center  01/17/2017 8:00 AM Orvilla Cornwallenney, Rachelle A, CNM [facility] None   Follow up Visit:No Follow-up on file.  Postpartum contraception: Abstinence  Newborn Data: Live born female  Birth Weight: 6 lb 10.7 oz (3025 g) APGAR: 7, 9  Baby Feeding: Breast Disposition:home with mother   12/21/2016 Ignacia MarvelKendrick C White, MD   I have seen and examined this patient. Pt requests to stay until tomorrow; d/c will be postponed until then. Cam HaiSHAW, Ilse Billman CNM 1:11 PM 12/21/2016

## 2016-12-21 NOTE — Lactation Note (Signed)
This note was copied from a baby's chart. Lactation Consultation Note  Patient Name: Audrey Pearson MOLMB'E Date: 12/21/2016 Reason for consult: Follow-up assessment;Difficult latch;Nipple pain/trauma   Follow up with mom of 44 hour old infant. Infant with 3 BF for 10-15 minutes, EBM x 5 via bottle and syringe of 6-27 cc, 3 voids and 4 stools in last 24 hours. LATCH scores 6-8.   Mom reports infant is latching better this morning with no pain. She reports infant just fed for 10 minutes and infant was still cueing to feed. Enc mom to relatch infant to the breast since still cueing. Mom did relatch infant with good position and support. Discussed with mom maintaining her pumping and supplementing at this time since bili is rising and infant not latching all the time. Mom reports they tried the syringe and she feels it is a 2 person job, so she is going to use the bottle to supplement. Enc mom to feed infant 8-12 x in 24 hours at first feeding cues for as long as infant wants.   Offered mom [facility] loaner, she reports she does not have the $30. She was shown how to use the manual pump and to use the double manual pump in her pump kit. Reviewed I/O, Engorgement prevention/treatment and breast milk handling and storage reviewed.   BF Resources Handout and [facility] Brochure reviewed, mom aware of OP services, BF Support Groups and [place] phone #. Mom has [facility] appt scheduled. Mom is aware WIC offer BF support also. Mom without questions/concerns at this time. Mom to call with any questions/concerns prn. She is working on follow up Ped appt.    Maternal Data Formula Feeding for Exclusion: No Has patient been taught Hand Expression?: Yes Does the patient have breastfeeding experience prior to this delivery?: No  Feeding Feeding Type: Breast Fed Length of feed: 15 min  LATCH Score Latch: Repeated attempts needed to sustain latch, nipple held in mouth throughout feeding, stimulation needed to elicit sucking  reflex.  Audible Swallowing: A few with stimulation  Type of Nipple: Everted at rest and after stimulation  Comfort (Breast/Nipple): Soft / non-tender  Hold (Positioning): No assistance needed to correctly position infant at breast.  LATCH Score: 8  Interventions Interventions: Breast feeding basics reviewed;Support pillows;Expressed milk;Hand express  Lactation Tools Discussed/Used WIC Program: Yes Pump Review: Setup, frequency, and cleaning;Milk Storage Initiated by:: reviewed and encouraged as long as infant not latching well    Consult Status Consult Status: Complete Follow-up type: Call as needed    Donn Pierini 12/21/2016, 9:17 AM

## 2016-12-22 NOTE — Lactation Note (Signed)
This note was copied from a baby's chart. Lactation Consultation Note  Patient Name: Girl Yvette Rackngel Alsop Today's Date: 12/22/2016  [facility]  Mom is pumping 60+ from each breast.  No questions/concerns at present.  OP services and support reviewed and encouraged prn.   Maternal Data    Feeding Feeding Type: Bottle Fed - Breast Milk  LATCH Score                   Interventions    Lactation Tools Discussed/Used     Consult Status      Huston FoleyMOULDEN, Ottavio Norem S 12/22/2016, 9:43 AM

## 2016-12-22 NOTE — Discharge Instructions (Signed)

## 2016-12-22 NOTE — Discharge Summary (Signed)
OB Discharge Summary     Patient Name: Audrey Pearson DOB: 16-Jan-1991 MRN: [id number]  Date of admission: 12/19/2016 Delivering MD: Joen Laura C   Date of discharge: 12/22/2016  Admitting diagnosis: 40 WEEKS ROM CTX Intrauterine pregnancy: [redacted]w[redacted]d     Secondary diagnosis:  Principal Problem:   PROM (premature rupture of membranes) Active Problems:   GBS (group B Streptococcus carrier), +RV culture, currently pregnant  Additional problems: none     Discharge diagnosis: Term Pregnancy Delivered                                                                                                Post partum procedures:none  Augmentation: Pitocin, Cytotec and Foley Balloon  Complications: None  Hospital course:  Onset of Labor With Vaginal Delivery     26 y.o. yo G2P0010 at [redacted]w[redacted]d was admitted in Latent Labor/SROM that required augmentation on 12/19/2016. Patient had an uncomplicated labor course as follows:  Membrane Rupture Time/Date: 3:48 AM ,12/19/2016   Intrapartum Procedures: Episiotomy: None [1]                                         Lacerations:  None [1]  Patient had a delivery of a Viable infant. 12/20/2016  Information for the patient's newborn:  Aidyn, Sportsman [[id number]]  Delivery Method: Vaginal, Spontaneous Delivery (Filed from Delivery Summary)    Pateint had an uncomplicated postpartum course.  She is ambulating, tolerating a regular diet, passing flatus, and urinating well. Only complaint is rectal/perirectal pain, that has been 7-8/10, constant.Reportedly very tender touch, attempted witch hazel cream, donut hole pillow, hydrocortisone cream, dibucaine ointment, all without much if any improvement of the discomfort. Ice packs help mildly and suggested a sitz bath.    Patient is discharged home in stable condition on 12/22/16.   Physical exam  Vitals:   12/20/16 2120 12/21/16 0601 12/21/16 1802 12/22/16 0524  BP:  110/60 116/63 118/73  Pulse: 90 75 79 74  Resp: 18  16 18 19   Temp:  98.7 F (37.1 C) 98.6 F (37 C) 98.7 F (37.1 C)  TempSrc:  Axillary Axillary Oral  SpO2:    100%  Weight:      Height:       General: alert and cooperative distressed about hemorrhoidal pain Lochia: appropriate Uterine Fundus: firm  DVT Evaluation: No evidence of DVT seen on physical exam. Labs: Lab Results  Component Value Date   WBC 13.7 (H) 12/19/2016   HGB 12.7 12/19/2016   HCT 36.9 12/19/2016   MCV 86.0 12/19/2016   PLT 184 12/19/2016   CMP Latest Ref Rng & Units 11/05/2016  Glucose 65 - 99 mg/dL -  BUN 6 - 20 mg/dL -  Creatinine 2.95 - 2.84 mg/dL 1.32(G)  Sodium 401 - 027 mmol/L -  Potassium 3.5 - 5.2 mmol/L -  Chloride 96 - 106 mmol/L -  CO2 20 - 29 mmol/L -  Calcium 8.7 - 10.2 mg/dL -  Total Protein 6.0 -  8.5 g/dL -  Total Bilirubin 0.0 - 1.2 mg/dL -  Alkaline Phos 39 - 960117 IU/L -  AST 0 - 40 IU/L -  ALT 0 - 32 IU/L -    Discharge instruction: per After Visit Summary and "Baby and Me Booklet".  After visit meds:  Allergies as of 12/22/2016   No Known Allergies     Medication List    STOP taking these medications   acetaminophen 500 MG tablet Commonly known as:  TYLENOL   CITRANATAL BLOOM 90-1 MG Tabs   hydrOXYzine 25 MG capsule Commonly known as:  VISTARIL   prochlorperazine 10 MG tablet Commonly known as:  COMPAZINE     TAKE these medications   albuterol 108 (90 Base) MCG/ACT inhaler Commonly known as:  PROVENTIL HFA;VENTOLIN HFA Inhale 2 puffs into the lungs every 6 (six) hours as needed for wheezing or shortness of breath.   beclomethasone 80 MCG/ACT inhaler Commonly known as:  QVAR REDIHALER Inhale 2 puffs into the lungs 2 (two) times daily.   montelukast 10 MG tablet Commonly known as:  SINGULAIR Take 1 tablet (10 mg total) by mouth at bedtime. What changed:  when to take this  reasons to take this   omeprazole 20 MG capsule Commonly known as:  PRILOSEC Take 1 capsule (20 mg total) by mouth 2 (two) times  daily before a meal. What changed:  when to take this  reasons to take this       Diet: routine diet  Activity: Advance as tolerated. Pelvic rest for 6 weeks.   Outpatient follow up:4 weeks Follow up Appt: Future Appointments Date Time Provider Department Center  01/17/2017 8:00 AM Orvilla Cornwallenney, Rachelle A, CNM [facility] None   Follow up Visit:No Follow-up on file.  Postpartum contraception: Abstinence  Newborn Data: Live born female  Birth Weight: 6 lb 10.7 oz (3025 g) APGAR: 7, 9  Baby Feeding: Breast Disposition:home with mother  Perineal pain improving. Feeling ready to discharge.   12/22/2016 John Giovanniorey P Cox, MD   OB FELLOW DISCHARGE ATTESTATION  I have seen and examined this patient. I agree with above documentation and have made edits as needed.   Caryl AdaJazma Phelps, DO OB Fellow 10:37 AM

## 2016-12-22 NOTE — Progress Notes (Signed)
CSW acknowledges consult for Edenberg score > 9. CSW met with MOB at bedside to assess. CSW provided education regarding Baby Blues vs PMADs and provided MOB with information about support groups held at [facility] encouraged MOB to evaluate her mental health throughout the postpartum period with the use of the New Mom Checklist developed by Postpartum Progress and notify a medical professional if symptoms arise. CSW identifies no further need for intervention at this time or barriers to discharge.  Audrey Pearson, MSW, LCSW-A Clinical Social Worker  [facility]  Office: [phone]

## 2016-12-22 NOTE — Lactation Note (Signed)
This note was copied from a baby's chart. Lactation Consultation Note Mom wished to pump and bottle feed d/t doesn't like baby suckling on breast d/t pain. LC offered to assist in latching to assess cause of pain. Mom prefers to pump and bottle feed. [facility], [facility].00. [facility], [facility] home. Peri JeffersonL. Derico Mitton is giving $30.00 for loaner. [facility], then $30.00 returned to [name]. Mom doesn't get $30.00 that is why papers [facility].  Mom has hand pump, single and reviewed double that is in [place]BP.  Moms has transitional milk coming in. Breast full, knots. Breast massage, pumped 60 ml. Gave mom bottle for milk refrigeration. Encouraged mom to lay flat for an hour, ICE given. Discussed engorgement, management, prevention, mastitis, hands free bra, how to make one, and milk storage.  Feeding sheet of how much baby should be feeding given.   Patient Name: Audrey Yvette Rackngel Manny ONGEX'BToday's Date: 12/22/2016 Reason for consult: Follow-up assessment;Engorgement   Maternal Data    Feeding Feeding Type: Breast Milk  LATCH Score       Type of Nipple: Everted at rest and after stimulation  Comfort (Breast/Nipple): Engorged, cracked, bleeding, large blisters, severe discomfort        Interventions Interventions: Support pillows;Position options;Expressed milk;Breast massage;Hand express;Coconut oil;Breast compression;Skin to skin;Breast feeding basics reviewed;Hand pump;DEBP  Lactation Tools Discussed/Used Tools: Pump Breast pump type: Double-Electric Breast Pump   Consult Status Consult Status: Follow-up Date: 12/22/16 Follow-up type: In-patient    Charyl DancerCARVER, [name] 12/22/2016, 2:57 AM

## 2016-12-26 ENCOUNTER — Inpatient Hospital Stay (HOSPITAL_COMMUNITY): Admission: RE | Admit: 2016-12-26 | Payer: Medicaid Other | Source: Ambulatory Visit

## 2017-01-17 ENCOUNTER — Encounter: Payer: Self-pay | Admitting: Certified Nurse Midwife

## 2017-01-17 ENCOUNTER — Ambulatory Visit (INDEPENDENT_AMBULATORY_CARE_PROVIDER_SITE_OTHER): Payer: Medicaid Other | Admitting: Certified Nurse Midwife

## 2017-01-17 VITALS — BP 114/73 | HR 86 | Wt 147.4 lb

## 2017-01-17 DIAGNOSIS — Z1389 Encounter for screening for other disorder: Secondary | ICD-10-CM

## 2017-01-17 DIAGNOSIS — B3789 Other sites of candidiasis: Secondary | ICD-10-CM

## 2017-01-17 DIAGNOSIS — O9102 Infection of nipple associated with the puerperium: Secondary | ICD-10-CM

## 2017-01-17 MED ORDER — FLUCONAZOLE 200 MG PO TABS: 200.0000 mg | ORAL_TABLET | Freq: Once | ORAL | 0 refills | Status: AC

## 2017-01-17 NOTE — Progress Notes (Signed)
..  Post Partum Exam  Audrey Pearson is a 26 y.o. 402P1011 female who presents for a postpartum visit. She is 4 weeks postpartum following a spontaneous vaginal delivery. I have fully reviewed the prenatal and intrapartum course. The delivery was at 40.1 gestational weeks.  Anesthesia: epidural. Postpartum course has been good. Baby's course has been good. Baby is feeding by both breast and bottle - Similac Advance. Bleeding staining only. Bowel function is abnormal: constipation discussed, OTC colace. Bladder function is normal. Patient is sexually active. Contraception method is none. Postpartum depression screening:neg  The following portions of the patient's history were reviewed and updated as appropriate: allergies, current medications, past family history, past medical history, past social history, past surgical history and problem list.  Review of Systems Pertinent items noted in HPI and remainder of comprehensive ROS otherwise negative.    Objective:  unknown if currently breastfeeding.  General:  alert, cooperative and no distress   Breasts:  negative findings: normal in size and symmetry, normal contour with no evidence of flattening or dimpling, palpation negative for masses or nodules, no palpable axillary lymphadenopathy and positive findings: nipple discharge bilaterally, cracked tender to palpation, white scaling discharge noted  Lungs: clear to auscultation bilaterally  Heart:  regular rate and rhythm, S1, S2 normal, no murmur, click, rub or gallop  Abdomen: soft, non-tender; bowel sounds normal; no masses,  no organomegaly  Pelvic Exam: Not performed.        Assessment:    Abnormal 4 week postpartum exam. Pap smear not done at today's visit.  Pap smear: 06/08/16; normal 1. Yeast infection of nipple, postpartum     All purpose nipple ointment.  OTC nipple Dorris CarnesShields.  - fluconazole (DIFLUCAN) 200 MG tablet; Take 1 tablet (200 mg total) by mouth once. Repeat dose in 48-72 hours.   Dispense: 3 tablet; Refill: 0.  Plan:   1. Contraception: abstinence and condoms 2. [facility]. May need lactation consult d/t current pumping and decreased lactation if interventions are not working as previously discussed.  3. Follow up in: 4 months or as needed.

## 2017-01-23 ENCOUNTER — Telehealth: Payer: Self-pay | Admitting: *Deleted

## 2017-01-23 NOTE — Telephone Encounter (Signed)
Please send Flovent.  Thank you.

## 2017-01-23 NOTE — Telephone Encounter (Signed)
Fax from pharmacy. QVar Redihaler is no longer covered by ins plan.  Currently listed is Flovent HFA or Pulmicort Respules. Please send new Rx if needed.

## 2017-01-24 ENCOUNTER — Other Ambulatory Visit: Payer: Self-pay | Admitting: *Deleted

## 2017-01-24 DIAGNOSIS — J4520 Mild intermittent asthma, uncomplicated: Secondary | ICD-10-CM

## 2017-01-24 MED ORDER — FLUTICASONE PROPIONATE HFA 110 MCG/ACT IN AERO: 2.0000 | INHALATION_SPRAY | Freq: Two times a day (BID) | RESPIRATORY_TRACT | 1 refills | Status: DC

## 2017-01-24 NOTE — Telephone Encounter (Signed)
Rx sent 

## 2017-01-24 NOTE — Progress Notes (Signed)
Flovent Rx sent per provider. See phone note.

## 2017-02-14 ENCOUNTER — Encounter (HOSPITAL_COMMUNITY): Payer: Self-pay | Admitting: Emergency Medicine

## 2017-02-14 ENCOUNTER — Emergency Department (HOSPITAL_COMMUNITY)
Admission: EM | Admit: 2017-02-14 | Discharge: 2017-02-14 | Disposition: A | Discharge status: Home / Self Care | Payer: Medicaid Other | Attending: Emergency Medicine | Admitting: Emergency Medicine

## 2017-02-14 DIAGNOSIS — J45909 Unspecified asthma, uncomplicated: Secondary | ICD-10-CM | POA: Diagnosis not present

## 2017-02-14 DIAGNOSIS — Z87891 Personal history of nicotine dependence: Secondary | ICD-10-CM | POA: Insufficient documentation

## 2017-02-14 DIAGNOSIS — T7840XA Allergy, unspecified, initial encounter: Secondary | ICD-10-CM | POA: Insufficient documentation

## 2017-02-14 DIAGNOSIS — R2231 Localized swelling, mass and lump, right upper limb: Secondary | ICD-10-CM | POA: Diagnosis present

## 2017-02-14 MED ORDER — FAMOTIDINE 20 MG PO TABS
20.0000 mg | ORAL_TABLET | Freq: Once | ORAL | Status: AC
  Administered 2017-02-14: 20 mg via ORAL
  Filled 2017-02-14: qty 1

## 2017-02-14 MED ORDER — DEXAMETHASONE SODIUM PHOSPHATE 10 MG/ML IJ SOLN
10.0000 mg | Freq: Once | INTRAMUSCULAR | Status: AC
  Administered 2017-02-14: 10 mg via INTRAVENOUS
  Filled 2017-02-14: qty 1

## 2017-02-14 MED ORDER — DIPHENHYDRAMINE HCL 25 MG PO TABS: 25.0000 mg | ORAL_TABLET | Freq: Four times a day (QID) | ORAL | 0 refills | Status: DC

## 2017-02-14 MED ORDER — DIPHENHYDRAMINE HCL 50 MG/ML IJ SOLN
25.0000 mg | Freq: Once | INTRAMUSCULAR | Status: AC
  Administered 2017-02-14: 25 mg via INTRAVENOUS
  Filled 2017-02-14: qty 1

## 2017-02-14 MED ORDER — SODIUM CHLORIDE 0.9 % IV BOLUS (SEPSIS)
1000.0000 mL | Freq: Once | INTRAVENOUS | Status: AC
  Administered 2017-02-14: 1000 mL via INTRAVENOUS

## 2017-02-14 MED ORDER — PREDNISONE 20 MG PO TABS: ORAL_TABLET | ORAL | 0 refills | Status: DC

## 2017-02-14 NOTE — ED Triage Notes (Addendum)
Pt to ED c/o swollen hand upon waking this morning. Pt states she was stung by a yellow jacket yesterday and had a little swelling afterward about 2 inches from the sting mark, but now entire hand and wrist is swollen and red. Limited mobility d/t swelling and pain. Sensation and circulation intact. Pt denies other symptoms.

## 2017-02-14 NOTE — ED Provider Notes (Signed)
[facility]EMERGENCY DEPT Provider Note   CSN: [id number] Arrival date & time: 02/14/17  1234     History   Chief Complaint Chief Complaint  Patient presents with  . Allergic Reaction    bee sting    HPI Audrey Pearson is a 26 y.o. female.  HPI   26 year old female presenting for evaluation of a recent bee sting. Patient state she was stung by a yellow jacket yesterday on her right hand. Since been stung she has noticed increasing swelling, and itchiness along with pain to the affected side. It now has spread towards her forearm which concerns her. She has tried using ice without adequate relief. She denies having fever, lightheadedness, dizziness, tongue swelling, trouble swallowing, chest pain, shortness of breath or abdominal cramping. She denies any hives. Denies any prior allergic reaction like this in the past.  Past Medical History:  Diagnosis Date  . Asthma   . IBS (irritable bowel syndrome)     Patient Active Problem List   Diagnosis Date Noted  . IBS (irritable bowel syndrome) 08/03/2016  . Sickle cell trait (HCC) 06/16/2016  . Mild intermittent asthma without complication 06/13/2016    Past Surgical History:  Procedure Laterality Date  . NO PAST SURGERIES      OB History    Gravida Para Term Preterm AB Living   0 1 1   SAB TAB Ectopic Multiple Live Births   1 0 0 0 1       Home Medications    Prior to Admission medications   Medication Sig Start Date End Date Taking? Authorizing Provider  fluticasone (FLOVENT HFA) 110 MCG/ACT inhaler Inhale 2 puffs into the lungs 2 (two) times daily. 01/24/17   Roe Coombs, CNM    Family History Family History  Problem Relation Age of Onset  . Adopted: Yes  . Breast cancer Mother     Social History Social History  Substance Use Topics  . Smoking status: Former Smoker    Types: Cigarettes    Quit date: 05/15/2015  . Smokeless tobacco: Never Used  . Alcohol use No     Comment: occ     Allergies     Patient has no known allergies.   Review of Systems Review of Systems  All other systems reviewed and are negative.    Physical Exam Updated Vital Signs BP 122/79 (BP Location: Left Arm)   Pulse 90   Temp 98.4 F (36.9 C) (Oral)   Resp 18   Ht  (1.651 m)   Wt 67.6 kg (149 lb)   SpO2 98%   Breastfeeding? No   BMI 24.79 kg/m   Physical Exam  Constitutional: She appears well-developed and well-nourished. No distress.  HENT:  Head: Atraumatic.  Normal oral mucosa, normal tongue appearance  Eyes: Conjunctivae are normal.  Neck: Neck supple. No tracheal deviation present.  Cardiovascular: Normal rate and regular rhythm.   Pulmonary/Chest: Effort normal and breath sounds normal. No stridor. No respiratory distress. She has no wheezes.  Abdominal: Soft. Bowel sounds are normal. She exhibits no distension. There is no tenderness.  Musculoskeletal: She exhibits tenderness (Right upper extremity: Moderate edema and erythema noted to the dorsum of right hand extending towards mid forearm mild tenderness to palpation without for body noted. Brisk cap refills to all fingers, radial pulse 2+. Warm compartment soft.).  Neurological: She is alert.  Skin: No rash noted.  Psychiatric: She has a normal mood and affect.  Nursing note  and vitals reviewed.    ED Treatments / Results  Labs (all labs ordered are listed, but only abnormal results are displayed) Labs Reviewed - No data to display  EKG  EKG Interpretation None       Radiology No results found.  Procedures Procedures (including critical care time)  Medications Ordered in ED Medications  sodium chloride 0.9 % bolus 1,000 mL (0 mLs Intravenous Stopped 02/14/17 1650)  dexamethasone (DECADRON) injection 10 mg (10 mg Intravenous Given 02/14/17 1452)  diphenhydrAMINE (BENADRYL) injection 25 mg (25 mg Intravenous Given 02/14/17 1453)  famotidine (PEPCID) tablet 20 mg (20 mg Oral Given 02/14/17 1441)     Initial  Impression / Assessment and Plan / ED Course  I have reviewed the triage vital signs and the nursing notes.  Pertinent labs & imaging results that were available during my care of the patient were reviewed by me and considered in my medical decision making (see chart for details).     BP 122/79 (BP Location: Left Arm)   Pulse 90   Temp 98.4 F (36.9 C) (Oral)   Resp 18   Ht  (1.651 m)   Wt 67.6 kg (149 lb)   SpO2 98%   Breastfeeding? No   BMI 24.79 kg/m    Final Clinical Impressions(s) / ED Diagnoses   Final diagnoses:  Allergic reaction, initial encounter    New Prescriptions New Prescriptions   DIPHENHYDRAMINE (BENADRYL) 25 MG TABLET    Take 1 tablet (25 mg total) by mouth every 6 (six) hours.   PREDNISONE (DELTASONE) 20 MG TABLET    2 tabs po daily x 4 days   2:29 PM Patient was stung by yellow jacket on her right hand since yesterday. She has moderate edema to her hand and forearm. She does not have any other systemic manifestation of her symptoms. I recommend keeping her elevated, will give steroids, fluids, Benadryl, and Pepcid.  5:02 PM Patient felt much better after receiving treatment. She was monitored in the ER for an appropriate level time. She is stable for discharge. Patient sent home with steroid, Benadryl. Return precaution discussed. I do not think patient will require an EpiPen at this time.   Fayrene Helper, PA-C 02/14/17 1703    Cathren Laine, MD 02/15/17 [phone]

## 2017-04-06 ENCOUNTER — Emergency Department (HOSPITAL_COMMUNITY): Payer: Self-pay

## 2017-04-06 ENCOUNTER — Encounter (HOSPITAL_COMMUNITY): Payer: Self-pay | Admitting: Emergency Medicine

## 2017-04-06 ENCOUNTER — Emergency Department (HOSPITAL_COMMUNITY)
Admission: EM | Admit: 2017-04-06 | Discharge: 2017-04-06 | Disposition: A | Discharge status: Home / Self Care | Payer: Self-pay | Attending: Emergency Medicine | Admitting: Emergency Medicine

## 2017-04-06 ENCOUNTER — Other Ambulatory Visit: Payer: Self-pay

## 2017-04-06 DIAGNOSIS — M791 Myalgia, unspecified site: Secondary | ICD-10-CM | POA: Insufficient documentation

## 2017-04-06 DIAGNOSIS — R0989 Other specified symptoms and signs involving the circulatory and respiratory systems: Secondary | ICD-10-CM | POA: Insufficient documentation

## 2017-04-06 DIAGNOSIS — R05 Cough: Secondary | ICD-10-CM | POA: Insufficient documentation

## 2017-04-06 DIAGNOSIS — J45909 Unspecified asthma, uncomplicated: Secondary | ICD-10-CM | POA: Insufficient documentation

## 2017-04-06 DIAGNOSIS — O26833 Pregnancy related renal disease, third trimester: Secondary | ICD-10-CM | POA: Insufficient documentation

## 2017-04-06 DIAGNOSIS — R69 Illness, unspecified: Secondary | ICD-10-CM | POA: Insufficient documentation

## 2017-04-06 DIAGNOSIS — J029 Acute pharyngitis, unspecified: Secondary | ICD-10-CM | POA: Insufficient documentation

## 2017-04-06 DIAGNOSIS — Z87891 Personal history of nicotine dependence: Secondary | ICD-10-CM | POA: Insufficient documentation

## 2017-04-06 DIAGNOSIS — N12 Tubulo-interstitial nephritis, not specified as acute or chronic: Secondary | ICD-10-CM

## 2017-04-06 DIAGNOSIS — J111 Influenza due to unidentified influenza virus with other respiratory manifestations: Secondary | ICD-10-CM

## 2017-04-06 LAB — COMPREHENSIVE METABOLIC PANEL
ALK PHOS: 57 U/L (ref 38–126)
ALT: 11 U/L — AB (ref 14–54)
AST: 18 U/L (ref 15–41)
Albumin: 3.5 g/dL (ref 3.5–5.0)
Anion gap: 9 (ref 5–15)
BILIRUBIN TOTAL: 0.6 mg/dL (ref 0.3–1.2)
CALCIUM: 8.6 mg/dL — AB (ref 8.9–10.3)
CHLORIDE: 106 mmol/L (ref 101–111)
CO2: 24 mmol/L (ref 22–32)
CREATININE: 0.68 mg/dL (ref 0.44–1.00)
Glucose, Bld: 92 mg/dL (ref 65–99)
Potassium: 2.9 mmol/L — ABNORMAL LOW (ref 3.5–5.1)
Sodium: 139 mmol/L (ref 135–145)
Total Protein: 6.5 g/dL (ref 6.5–8.1)

## 2017-04-06 LAB — CBC WITH DIFFERENTIAL/PLATELET
BASOS PCT: 0 %
Basophils Absolute: 0 10*3/uL (ref 0.0–0.1)
EOS ABS: 0 10*3/uL (ref 0.0–0.7)
EOS PCT: 0 %
HCT: 37.8 % (ref 36.0–46.0)
Hemoglobin: 12.9 g/dL (ref 12.0–15.0)
LYMPHS ABS: 1.2 10*3/uL (ref 0.7–4.0)
Lymphocytes Relative: 10 %
MCH: 30.4 pg (ref 26.0–34.0)
MCHC: 34.1 g/dL (ref 30.0–36.0)
MCV: 88.9 fL (ref 78.0–100.0)
Monocytes Absolute: 0.8 10*3/uL (ref 0.1–1.0)
Monocytes Relative: 7 %
Neutro Abs: 9.6 10*3/uL — ABNORMAL HIGH (ref 1.7–7.7)
Neutrophils Relative %: 83 %
PLATELETS: 198 10*3/uL (ref 150–400)
RBC: 4.25 MIL/uL (ref 3.87–5.11)
RDW: 13.9 % (ref 11.5–15.5)
WBC: 11.7 10*3/uL — AB (ref 4.0–10.5)

## 2017-04-06 LAB — URINALYSIS, ROUTINE W REFLEX MICROSCOPIC
Bilirubin Urine: NEGATIVE
Glucose, UA: NEGATIVE mg/dL
Ketones, ur: NEGATIVE mg/dL
NITRITE: NEGATIVE
PH: 5 (ref 5.0–8.0)
Protein, ur: NEGATIVE mg/dL
SPECIFIC GRAVITY, URINE: 1.013 (ref 1.005–1.030)

## 2017-04-06 LAB — I-STAT CG4 LACTIC ACID, ED: LACTIC ACID, VENOUS: 1.06 mmol/L (ref 0.5–1.9)

## 2017-04-06 LAB — PREGNANCY, URINE: Preg Test, Ur: NEGATIVE

## 2017-04-06 MED ORDER — ONDANSETRON HCL 4 MG PO TABS: 4.0000 mg | ORAL_TABLET | Freq: Four times a day (QID) | ORAL | 0 refills | Status: DC | PRN

## 2017-04-06 MED ORDER — SODIUM CHLORIDE 0.9 % IV BOLUS (SEPSIS)
1000.0000 mL | Freq: Once | INTRAVENOUS | Status: AC
  Administered 2017-04-06: 1000 mL via INTRAVENOUS

## 2017-04-06 MED ORDER — POTASSIUM CHLORIDE CRYS ER 20 MEQ PO TBCR
80.0000 meq | EXTENDED_RELEASE_TABLET | Freq: Once | ORAL | Status: AC
  Administered 2017-04-06: 80 meq via ORAL
  Filled 2017-04-06: qty 4

## 2017-04-06 MED ORDER — IBUPROFEN 600 MG PO TABS: 600.0000 mg | ORAL_TABLET | Freq: Four times a day (QID) | ORAL | 0 refills | Status: DC | PRN

## 2017-04-06 MED ORDER — POTASSIUM CHLORIDE CRYS ER 20 MEQ PO TBCR: 40.0000 meq | EXTENDED_RELEASE_TABLET | Freq: Two times a day (BID) | ORAL | 0 refills | Status: DC

## 2017-04-06 MED ORDER — DEXTROSE 5 % IV SOLN
1.0000 g | Freq: Once | INTRAVENOUS | Status: AC
  Administered 2017-04-06: 1 g via INTRAVENOUS
  Filled 2017-04-06: qty 10

## 2017-04-06 MED ORDER — ACETAMINOPHEN 500 MG PO TABS: 1000.0000 mg | ORAL_TABLET | Freq: Once | ORAL | Status: DC

## 2017-04-06 MED ORDER — ACETAMINOPHEN 325 MG PO TABS
650.0000 mg | ORAL_TABLET | Freq: Once | ORAL | Status: AC | PRN
  Administered 2017-04-06: 650 mg via ORAL
  Filled 2017-04-06: qty 2

## 2017-04-06 MED ORDER — CIPROFLOXACIN HCL 500 MG PO TABS: 500.0000 mg | ORAL_TABLET | Freq: Two times a day (BID) | ORAL | 0 refills | Status: AC

## 2017-04-06 MED ORDER — IBUPROFEN 400 MG PO TABS
600.0000 mg | ORAL_TABLET | Freq: Once | ORAL | Status: AC
  Administered 2017-04-06: 600 mg via ORAL
  Filled 2017-04-06: qty 1

## 2017-04-06 MED ORDER — ONDANSETRON HCL 4 MG/2ML IJ SOLN
4.0000 mg | Freq: Once | INTRAMUSCULAR | Status: AC
  Administered 2017-04-06: 4 mg via INTRAVENOUS
  Filled 2017-04-06: qty 2

## 2017-04-06 NOTE — ED Triage Notes (Signed)
Pt c/o body aches, fever, flank pain, strong odor to urine, pt recently had flu x 2 (when pregnant ) 5 months ago-- pt also states that baby has bronchitis, dx on thanksgiving here

## 2017-04-06 NOTE — Discharge Instructions (Signed)
You have evidence of a kidney infection. You also likely have a viral respiratory illness. Please take antibiotics for kidney infection. Return without fail for worsening symptoms, including fever, intractable vomiting, escalating pain, or any other symptoms concerning to you.

## 2017-04-06 NOTE — ED Provider Notes (Signed)
[facility] EMERGENCY DEPARTMENT Provider Note   CSN: [id number] Arrival date & time: 04/06/17  1215     History   Chief Complaint Chief Complaint  Patient presents with  . flu like sx  . Generalized Body Aches  . Fever    HPI Audrey Pearson is a 26 y.o. female.  HPI 26 year old female who presents with fever.  She is about 5 weeks postpartum and has a history of asthma.  Reports 2-3 days of cough, congestion, runny nose sore throat.  It was followed by body aches, fever, and generalized malaise.  States that she then began to have bilateral flank tenderness, and a strong odor to her urine.  Denies any dysuria or urinary hesitancy, but thinks she may have had mild frequency.  She has had associating vomiting but denies any diarrhea, abdominal pain, chest pain or difficulty breathing.  Her child is currently ill with bronchiolitis. She has had no improvement with OTC tylenol and ibuprofen.   Past Medical History:  Diagnosis Date  . Asthma   . IBS (irritable bowel syndrome)     Patient Active Problem List   Diagnosis Date Noted  . IBS (irritable bowel syndrome) 08/03/2016  . Sickle cell trait (HCC) 06/16/2016  . Mild intermittent asthma without complication 06/13/2016    Past Surgical History:  Procedure Laterality Date  . NO PAST SURGERIES      OB History    Gravida Para Term Preterm AB Living   2 1 1  0 1 1   SAB TAB Ectopic Multiple Live Births   1 0 0 0 1       Home Medications    Prior to Admission medications   Medication Sig Start Date End Date Taking? Authorizing Provider  albuterol (PROVENTIL HFA;VENTOLIN HFA) 108 (90 Base) MCG/ACT inhaler Inhale 2 puffs into the lungs every 6 (six) hours as needed for wheezing or shortness of breath.    [provider]  diphenhydrAMINE (BENADRYL) 25 MG tablet Take 1 tablet (25 mg total) by mouth every 6 (six) hours. 02/14/17   Fayrene Helper, PA-C  ibuprofen (ADVIL,MOTRIN) 200 MG tablet Take 400  mg by mouth every 6 (six) hours as needed.    [provider]  montelukast (SINGULAIR) 10 MG tablet Take 10 tablets by mouth daily as needed. 11/28/16   [provider]  predniSONE (DELTASONE) 20 MG tablet 2 tabs po daily x 4 days 02/14/17   Fayrene Helper, PA-C    Family History Family History  Adopted: Yes  Problem Relation Age of Onset  . Breast cancer Mother     Social History Social History   Tobacco Use  . Smoking status: Former Smoker    Types: Cigarettes    Last attempt to quit: 05/15/2015    Years since quitting: 1.8  . Smokeless tobacco: Never Used  Substance Use Topics  . Alcohol use: No    Comment: occ  . Drug use: No     Allergies   Patient has no known allergies.   Review of Systems Review of Systems  Constitutional: Positive for fatigue and fever.  HENT: Positive for congestion.   Respiratory: Positive for cough.   Cardiovascular: Negative for chest pain and leg swelling.  Gastrointestinal: Positive for nausea and vomiting. Negative for diarrhea.  Genitourinary: Negative for dysuria.  All other systems reviewed and are negative.    Physical Exam Updated Vital Signs BP 113/85   Pulse 98   Temp (!) 101.1 F (38.4  C) (Oral)   Resp 18   Ht 5\' 5"  (1.651 m)   Wt 67.6 kg (149 lb)   LMP 03/25/2017 (Exact Date)   SpO2 100%   BMI 24.79 kg/m   Physical Exam Physical Exam  Nursing note and vitals reviewed. Constitutional: Well developed, well nourished, non-toxic, and in no acute distress Head: Normocephalic and atraumatic.  Mouth/Throat: Oropharynx is clear and moist.  Neck: Normal range of motion. Neck supple.  Cardiovascular: Normal rate and regular rhythm.   Pulmonary/Chest: Effort normal and breath sounds normal.  Abdominal: Soft. There is mild generalized tenderness. There is no rebound and no guarding.  Musculoskeletal: Normal range of motion.  Neurological: Alert, no facial droop, fluent speech, moves all extremities  symmetrically Skin: Skin is warm and dry.  Psychiatric: Cooperative   ED Treatments / Results  Labs (all labs ordered are listed, but only abnormal results are displayed) Labs Reviewed  COMPREHENSIVE METABOLIC PANEL - Abnormal; Notable for the following components:      Result Value   Potassium 2.9 (*)    BUN <5 (*)    Calcium 8.6 (*)    ALT 11 (*)    All other components within normal limits  CBC WITH DIFFERENTIAL/PLATELET - Abnormal; Notable for the following components:   WBC 11.7 (*)    Neutro Abs 9.6 (*)    All other components within normal limits  URINALYSIS, ROUTINE W REFLEX MICROSCOPIC - Abnormal; Notable for the following components:   APPearance HAZY (*)    Hgb urine dipstick SMALL (*)    Leukocytes, UA SMALL (*)    Bacteria, UA MANY (*)    Squamous Epithelial / LPF 0-5 (*)    All other components within normal limits  URINE CULTURE  PREGNANCY, URINE  I-STAT CG4 LACTIC ACID, ED    EKG  EKG Interpretation None       Radiology Dg Chest 2 View  Result Date: 04/06/2017 CLINICAL DATA:  Productive cough. EXAM: CHEST  2 VIEW COMPARISON:  Radiographs of March 24, 2016. FINDINGS: The heart size and mediastinal contours are within normal limits. Both lungs are clear. No pneumothorax or pleural effusion is noted. The visualized skeletal structures are unremarkable. IMPRESSION: No active cardiopulmonary disease. Electronically Signed   By: Lupita RaiderJames  Green Jr, M.D.   On: 04/06/2017 14:55    Procedures Procedures (including critical care time)  Medications Ordered in ED Medications  ibuprofen (ADVIL,MOTRIN) tablet 600 mg (not administered)  cefTRIAXone (ROCEPHIN) 1 g in dextrose 5 % 50 mL IVPB (not administered)  potassium chloride SA (K-DUR,KLOR-CON) CR tablet 80 mEq (not administered)  acetaminophen (TYLENOL) tablet 650 mg (650 mg Oral Given 04/06/17 1323)  sodium chloride 0.9 % bolus 1,000 mL (1,000 mLs Intravenous New Bag/Given 04/06/17 1527)  ondansetron  (ZOFRAN) injection 4 mg (4 mg Intravenous Given 04/06/17 1525)     Initial Impression / Assessment and Plan / ED Course  I have reviewed the triage vital signs and the nursing notes.  Pertinent labs & imaging results that were available during my care of the patient were reviewed by me and considered in my medical decision making (see chart for details).     Patient presents with flulike symptoms as well as urinary symptoms.  She is febrile, mildly tachycardic but normotensive.  She is in no acute distress and is very well-appearing.  Abdomen overall soft and benign.  Chest x-ray visualized and shows no evidence of pneumonia or other acute cardiopulmonary processes.  Blood work shows mild leukocytosis  and hypokalemia. Given repletion as likely from vomiting. UA is suggestive of urinary tract infection and given her flank pain, vomiting and fever symptoms likely related to pyelonephritis. However, could have concurrent flu like illness given cough, congestion, sore throat and runny nose.   She did receive IV fluids, ceftriaxone, antiemetics and antipyretics.  Urine ordered for culture. She feels improved.  She has no further vomiting and is tolerating p.o.  I do feel that she is safe for outpatient management of pyelonephritis and viral respiratory illness. Will discharge home with 2 week course of antibiotics, potassium repletion and pain/nausea medications. Strict return and follow-up instructions reviewed. She expressed understanding of all discharge instructions and felt comfortable with the plan of care.   Final Clinical Impressions(s) / ED Diagnoses   Final diagnoses:  Influenza-like illness  Pyelonephritis    ED Discharge Orders    None       Lavera GuiseLiu, Sharifah Champine Duo, MD 04/06/17 [id number]

## 2017-04-08 LAB — URINE CULTURE

## 2017-04-09 ENCOUNTER — Telehealth: Payer: Self-pay | Admitting: Emergency Medicine

## 2017-04-09 NOTE — Telephone Encounter (Signed)
Post ED Visit - Positive Culture Follow-up  Culture report reviewed by antimicrobial stewardship pharmacist:  []  Enzo BiNathan Batchelder, Pharm.D. []  Celedonio MiyamotoJeremy Frens, Pharm.D., BCPS AQ-ID []  Garvin FilaMike Maccia, Pharm.D., BCPS []  Georgina PillionElizabeth Martin, [address]Pharm.D., BCPS []  NelsonMinh Pham, [place]arm[place]D., BCPS, AAHIVP []  Estella HuskMichelle Turner, Pharm.D., BCPS, AAHIVP []  Lysle Pearlachel Rumbarger, PharmD, BCPS []  Casilda Carlsaylor Stone, PharmD, BCPS []  Pollyann SamplesAndy Johnston, PharmD, BCPS  Dellie Catholiciana Raymond PharmD   Positive urine culture Treated with ciprofloxacin, organism sensitive to the same and no further patient follow-up is required at this time.  Berle MullMiller, Inioluwa Boulay 04/09/2017, 3:42 PM

## 2017-05-01 ENCOUNTER — Encounter (HOSPITAL_COMMUNITY): Payer: Self-pay | Admitting: *Deleted

## 2017-05-01 ENCOUNTER — Other Ambulatory Visit: Payer: Self-pay

## 2017-05-01 ENCOUNTER — Emergency Department (HOSPITAL_COMMUNITY)
Admission: EM | Admit: 2017-05-01 | Discharge: 2017-05-01 | Disposition: A | Discharge status: Home / Self Care | Payer: Self-pay | Attending: Emergency Medicine | Admitting: Emergency Medicine

## 2017-05-01 DIAGNOSIS — R110 Nausea: Secondary | ICD-10-CM | POA: Insufficient documentation

## 2017-05-01 DIAGNOSIS — Z113 Encounter for screening for infections with a predominantly sexual mode of transmission: Secondary | ICD-10-CM | POA: Insufficient documentation

## 2017-05-01 DIAGNOSIS — Z79899 Other long term (current) drug therapy: Secondary | ICD-10-CM | POA: Insufficient documentation

## 2017-05-01 DIAGNOSIS — Z87891 Personal history of nicotine dependence: Secondary | ICD-10-CM | POA: Insufficient documentation

## 2017-05-01 DIAGNOSIS — J45909 Unspecified asthma, uncomplicated: Secondary | ICD-10-CM | POA: Insufficient documentation

## 2017-05-01 DIAGNOSIS — N898 Other specified noninflammatory disorders of vagina: Secondary | ICD-10-CM | POA: Insufficient documentation

## 2017-05-01 LAB — COMPREHENSIVE METABOLIC PANEL
ALK PHOS: 68 U/L (ref 38–126)
ALT: 12 U/L — AB (ref 14–54)
AST: 16 U/L (ref 15–41)
Albumin: 3.8 g/dL (ref 3.5–5.0)
Anion gap: 7 (ref 5–15)
CHLORIDE: 103 mmol/L (ref 101–111)
CO2: 24 mmol/L (ref 22–32)
CREATININE: 0.59 mg/dL (ref 0.44–1.00)
Calcium: 8.9 mg/dL (ref 8.9–10.3)
GFR calc Af Amer: >60 mL/min (ref >60)
GFR calc non Af Amer: >60 mL/min (ref >60)
Glucose, Bld: 94 mg/dL (ref 65–99)
Potassium: 3.3 mmol/L — ABNORMAL LOW (ref 3.5–5.1)
SODIUM: 134 mmol/L — AB (ref 135–145)
Total Bilirubin: 0.4 mg/dL (ref 0.3–1.2)
Total Protein: 6.8 g/dL (ref 6.5–8.1)

## 2017-05-01 LAB — URINALYSIS, ROUTINE W REFLEX MICROSCOPIC
BILIRUBIN URINE: NEGATIVE
GLUCOSE, UA: NEGATIVE mg/dL
HGB URINE DIPSTICK: NEGATIVE
KETONES UR: NEGATIVE mg/dL
Leukocytes, UA: NEGATIVE
Nitrite: NEGATIVE
PROTEIN: NEGATIVE mg/dL
Specific Gravity, Urine: 1.006 (ref 1.005–1.030)
pH: 7 (ref 5.0–8.0)

## 2017-05-01 LAB — CBC
HCT: 39.8 % (ref 36.0–46.0)
Hemoglobin: 13.4 g/dL (ref 12.0–15.0)
MCH: 30 pg (ref 26.0–34.0)
MCHC: 33.7 g/dL (ref 30.0–36.0)
MCV: 89.2 fL (ref 78.0–100.0)
PLATELETS: 209 10*3/uL (ref 150–400)
RBC: 4.46 MIL/uL (ref 3.87–5.11)
RDW: 14.3 % (ref 11.5–15.5)
WBC: 10.8 10*3/uL — ABNORMAL HIGH (ref 4.0–10.5)

## 2017-05-01 LAB — WET PREP, GENITAL
Clue Cells Wet Prep HPF POC: NONE SEEN
SPERM: NONE SEEN
TRICH WET PREP: NONE SEEN
Yeast Wet Prep HPF POC: NONE SEEN

## 2017-05-01 LAB — I-STAT BETA HCG BLOOD, ED (MC, WL, AP ONLY): I-stat hCG, quantitative: <5 m[IU]/mL (ref <5)

## 2017-05-01 LAB — LIPASE, BLOOD: LIPASE: 34 U/L (ref 11–51)

## 2017-05-01 MED ORDER — AZITHROMYCIN 250 MG PO TABS
1000.0000 mg | ORAL_TABLET | Freq: Once | ORAL | Status: AC
  Administered 2017-05-01: 1000 mg via ORAL
  Filled 2017-05-01: qty 4

## 2017-05-01 MED ORDER — STERILE WATER FOR INJECTION IJ SOLN
INTRAMUSCULAR | Status: AC
  Administered 2017-05-01: 0.9 mL
  Filled 2017-05-01: qty 10

## 2017-05-01 MED ORDER — CEFTRIAXONE SODIUM 250 MG IJ SOLR
250.0000 mg | Freq: Once | INTRAMUSCULAR | Status: AC
  Administered 2017-05-01: 250 mg via INTRAMUSCULAR
  Filled 2017-05-01: qty 250

## 2017-05-01 NOTE — ED Notes (Signed)
ED Provider at bedside. 

## 2017-05-01 NOTE — ED Triage Notes (Signed)
Pt has not been feeling well for the last week.  Reports abdominal pain and nausea.  Pt has normal vaginal discharge.  She was told her partner may have experienced something.  LMP November

## 2017-05-01 NOTE — Discharge Instructions (Signed)
Your work-up today is reassuring.  You were given antibiotics for treatment of possible STDs. Your results will come back in 2-3 days. Please follow-up on MiChart.  Please return without fail for worsening symptoms, including fever

## 2017-05-01 NOTE — ED Provider Notes (Signed)
[facility] EMERGENCY DEPARTMENT Provider Note   CSN: [id number] Arrival date & time: 05/01/17  1311     History   Chief Complaint Chief Complaint  Patient presents with  . Abdominal Pain  . Nausea    HPI Audrey Pearson is a 26 y.o. female.  HPI 26 year old female who presents with intermittent abdominal pain, nausea, and vaginal discharge.  States that she currently is recovering from viral respiratory illness.  Over the last few days has been having some mild intermittent abdominal cramping associated with nausea.  States that she recently engaged in sexual intercourse with a new partner and did not use condoms.  She was told by her partner yesterday that he may be experiencing symptoms of STD and told her to be evaluated.  She has noted thicker vaginal discharge today with odor.  Denies any fever, vomiting, diarrhea, dysuria or urinary frequency.  Denies genital rash.  Last LMP was 1 month ago.   Past Medical History:  Diagnosis Date  . Asthma   . IBS (irritable bowel syndrome)     Patient Active Problem List   Diagnosis Date Noted  . IBS (irritable bowel syndrome) 08/03/2016  . Sickle cell trait (HCC) 06/16/2016  . Mild intermittent asthma without complication 06/13/2016    Past Surgical History:  Procedure Laterality Date  . NO PAST SURGERIES      OB History    Gravida Para Term Preterm AB Living   2 1 1  0 1 1   SAB TAB Ectopic Multiple Live Births   1 0 0 0 1       Home Medications    Prior to Admission medications   Medication Sig Start Date End Date Taking? Authorizing Provider  acetaminophen (TYLENOL) 500 MG tablet Take 1,000 mg by mouth every 6 (six) hours as needed for mild pain.   Yes [provider]  albuterol (PROVENTIL HFA;VENTOLIN HFA) 108 (90 Base) MCG/ACT inhaler Inhale 2 puffs into the lungs every 6 (six) hours as needed for wheezing or shortness of breath.   Yes [provider]  diphenhydrAMINE (BENADRYL)  25 MG tablet Take 1 tablet (25 mg total) by mouth every 6 (six) hours. Patient not taking: Reported on 05/01/2017 02/14/17   Fayrene Helper, PA-C  ibuprofen (ADVIL,MOTRIN) 600 MG tablet Take 1 tablet (600 mg total) by mouth every 6 (six) hours as needed. Patient not taking: Reported on 05/01/2017 04/06/17   Lavera Guise, MD  ondansetron (ZOFRAN) 4 MG tablet Take 1 tablet (4 mg total) by mouth every 6 (six) hours as needed for nausea or vomiting. Patient not taking: Reported on 05/01/2017 04/06/17   Lavera Guise, MD  potassium chloride SA (K-DUR,KLOR-CON) 20 MEQ tablet Take 2 tablets (40 mEq total) by mouth 2 (two) times daily for 7 days. Patient not taking: Reported on 05/01/2017 04/06/17 04/13/17  Lavera Guise, MD  predniSONE (DELTASONE) 20 MG tablet 2 tabs po daily x 4 days Patient not taking: Reported on 05/01/2017 02/14/17   Fayrene Helper, PA-C    Family History Family History  Adopted: Yes  Problem Relation Age of Onset  . Breast cancer Mother     Social History Social History   Tobacco Use  . Smoking status: Former Smoker    Types: Cigarettes    Last attempt to quit: 05/15/2015    Years since quitting: 1.9  . Smokeless tobacco: Never Used  Substance Use Topics  . Alcohol use: No    Comment: occ  .  Drug use: No     Allergies   Patient has no known allergies.   Review of Systems Review of Systems  Constitutional: Negative for fever.  Respiratory: Negative for shortness of breath.   Cardiovascular: Negative for chest pain.  Genitourinary: Positive for vaginal discharge. Negative for dysuria and vaginal bleeding.  All other systems reviewed and are negative.    Physical Exam Updated Vital Signs BP (!) 115/49 (BP Location: Right Arm)   Pulse 77   Temp 99 F (37.2 C) (Oral)   Resp 16   LMP  (Approximate) Comment: last one in NOVEMBER  SpO2 99%   Physical Exam Physical Exam  Nursing note and vitals reviewed. Constitutional: Well developed, well nourished,  non-toxic, and in no acute distress Head: Normocephalic and atraumatic.  Mouth/Throat: Oropharynx is clear and moist.  Neck: Normal range of motion. Neck supple.  Cardiovascular: Normal rate and regular rhythm.   Pulmonary/Chest: Effort normal and breath sounds normal.  Abdominal: Soft. There is no tenderness. There is no rebound and no guarding.  Musculoskeletal: Normal range of motion.  Neurological: Alert, no facial droop, fluent speech, moves all extremities symmetrically Skin: Skin is warm and dry.  Psychiatric: Cooperative Pelvic: Normal external genitalia. Normal internal genitalia. White vaginal discharge. No blood within the vagina. No cervical motion tenderness. No adnexal masses or tenderness.   ED Treatments / Results  Labs (all labs ordered are listed, but only abnormal results are displayed) Labs Reviewed  WET PREP, GENITAL - Abnormal; Notable for the following components:      Result Value   WBC, Wet Prep HPF POC MODERATE (*)    All other components within normal limits  COMPREHENSIVE METABOLIC PANEL - Abnormal; Notable for the following components:   Sodium 134 (*)    Potassium 3.3 (*)    BUN <5 (*)    ALT 12 (*)    All other components within normal limits  CBC - Abnormal; Notable for the following components:   WBC 10.8 (*)    All other components within normal limits  URINALYSIS, ROUTINE W REFLEX MICROSCOPIC - Abnormal; Notable for the following components:   Color, Urine STRAW (*)    All other components within normal limits  URINE CULTURE  LIPASE, BLOOD  HIV ANTIBODY (ROUTINE TESTING)  RPR  I-STAT BETA HCG BLOOD, ED ([facility], [facility], [facility] ONLY)  GC/CHLAMYDIA PROBE AMP ([place]    EKG  EKG Interpretation None       Radiology No results found.  Procedures Procedures (including critical care time)  Medications Ordered in ED Medications  cefTRIAXone (ROCEPHIN) injection 250 mg (not administered)  azithromycin (ZITHROMAX) tablet 1,000 mg  (not administered)     Initial Impression / Assessment and Plan / ED Course  I have reviewed the triage vital signs and the nursing notes.  Pertinent labs & imaging results that were available during my care of the patient were reviewed by me and considered in my medical decision making (see chart for details).     26 year old female who presents with concern for STD.  She is well-appearing in no acute distress with normal vital signs.  She has a soft and non-tender abdomen.  Pelvic exam is not concerning for PID or TOA.  Wet prep is unremarkable aside from WBCs.  She is not pregnant and UA also unremarkable.  She is requesting empiric treatment for STDs.  GC chlamydia, HIV and syphilis testing are pending at this time. Given ceftraixone, azithromycin.  Strict return and follow-up instructions reviewed. She expressed understanding of all discharge instructions and felt comfortable with the plan of care.   Final Clinical Impressions(s) / ED Diagnoses   Final diagnoses:  Vaginal discharge    ED Discharge Orders    None       Lavera GuiseLiu, Keylor Rands Duo, MD 05/01/17 2122

## 2017-05-02 LAB — GC/CHLAMYDIA PROBE AMP (~~LOC~~) NOT AT ARMC
Chlamydia: NEGATIVE
NEISSERIA GONORRHEA: NEGATIVE

## 2017-05-02 LAB — RPR: RPR: NONREACTIVE

## 2017-05-02 LAB — HIV ANTIBODY (ROUTINE TESTING W REFLEX): HIV SCREEN 4TH GENERATION: NONREACTIVE

## 2017-06-20 ENCOUNTER — Emergency Department (HOSPITAL_COMMUNITY)
Admission: EM | Admit: 2017-06-20 | Discharge: 2017-06-20 | Disposition: A | Discharge status: Home / Self Care | Payer: Self-pay | Attending: Emergency Medicine | Admitting: Emergency Medicine

## 2017-06-20 ENCOUNTER — Other Ambulatory Visit: Payer: Self-pay

## 2017-06-20 ENCOUNTER — Encounter (HOSPITAL_COMMUNITY): Payer: Self-pay | Admitting: *Deleted

## 2017-06-20 ENCOUNTER — Encounter (INDEPENDENT_AMBULATORY_CARE_PROVIDER_SITE_OTHER): Payer: Self-pay

## 2017-06-20 DIAGNOSIS — R69 Illness, unspecified: Secondary | ICD-10-CM

## 2017-06-20 DIAGNOSIS — J111 Influenza due to unidentified influenza virus with other respiratory manifestations: Secondary | ICD-10-CM | POA: Insufficient documentation

## 2017-06-20 DIAGNOSIS — Z87891 Personal history of nicotine dependence: Secondary | ICD-10-CM | POA: Insufficient documentation

## 2017-06-20 DIAGNOSIS — Z79899 Other long term (current) drug therapy: Secondary | ICD-10-CM | POA: Insufficient documentation

## 2017-06-20 DIAGNOSIS — J4520 Mild intermittent asthma, uncomplicated: Secondary | ICD-10-CM | POA: Insufficient documentation

## 2017-06-20 LAB — INFLUENZA PANEL BY PCR (TYPE A & B)
Influenza A By PCR: NEGATIVE
Influenza B By PCR: NEGATIVE

## 2017-06-20 LAB — POC URINE PREG, ED: Preg Test, Ur: NEGATIVE

## 2017-06-20 MED ORDER — OSELTAMIVIR PHOSPHATE 75 MG PO CAPS: 75.0000 mg | ORAL_CAPSULE | Freq: Two times a day (BID) | ORAL | 0 refills | Status: DC

## 2017-06-20 MED ORDER — DM-GUAIFENESIN ER 30-600 MG PO TB12: 1.0000 | ORAL_TABLET | Freq: Two times a day (BID) | ORAL | 0 refills | Status: AC

## 2017-06-20 MED ORDER — ACETAMINOPHEN 325 MG PO TABS
325.0000 mg | ORAL_TABLET | Freq: Once | ORAL | Status: AC
  Administered 2017-06-20: 650 mg via ORAL
  Filled 2017-06-20: qty 1

## 2017-06-20 MED ORDER — ALBUTEROL SULFATE HFA 108 (90 BASE) MCG/ACT IN AERS
1.0000 | INHALATION_SPRAY | Freq: Once | RESPIRATORY_TRACT | Status: AC
  Administered 2017-06-20: 1 via RESPIRATORY_TRACT
  Filled 2017-06-20: qty 6.7

## 2017-06-20 NOTE — Discharge Instructions (Signed)
Please read and follow all provided instructions.  Your diagnoses today include:  1. Influenza-like illness     You appear to have an upper respiratory infection (URI). An upper respiratory tract infection, or cold, is a viral infection of the air passages leading to the lungs. It should improve gradually after 5-7 days. You may have a lingering cough that lasts for 2- 4 weeks after the infection.  Tests performed today include: Vital signs. See below for your results today.   Medications prescribed:   Take any prescribed medications only as directed. Treatment for your infection is aimed at treating the symptoms. There are no medications, such as antibiotics, that will cure your infection.   Home care instructions:  Follow any educational materials contained in this packet.   Your illness is contagious and can be spread to others, especially during the first 3 or 4 days. It cannot be cured by antibiotics or other medicines. Take basic precautions such as washing your hands often, covering your mouth when you cough or sneeze, and avoiding public places where you could spread your illness to others.   Please continue drinking plenty of fluids.  Use over-the-counter medicines as needed as directed on packaging for symptom relief.  You may also use ibuprofen or tylenol as directed on packaging for pain or fever.  Do not take multiple medicines containing Tylenol or acetaminophen to avoid taking too much of this medication.  Follow-up instructions: Please follow-up with your primary care provider in the next 3 days for further evaluation of your symptoms if you are not feeling better.   Return instructions:  Please return to the Emergency Department if you experience worsening symptoms.  RETURN IMMEDIATELY IF you develop shortness of breath, confusion or altered mental status, a new rash, become dizzy, faint, or poorly responsive, or are unable to be cared for at home. Please return if you  have persistent vomiting and cannot keep down fluids or develop a fever that is not controlled by tylenol or motrin.   Please return if you have any other emergent concerns.  Additional Information:  Your vital signs today were: BP 128/78 (BP Location: Right Arm)    Pulse 98    Temp 98.8 F (37.1 C) (Oral)    Resp 18    SpO2 100%  If your blood pressure (BP) was elevated above 135/85 this visit, please have this repeated by your doctor within one month. --------------

## 2017-06-20 NOTE — ED Triage Notes (Signed)
Pt is here with headache, ear ache, coughing, sneezing, shooting snot, temp 101.0 this am, body aches, chills, Pt symptoms started a couple of days ago.

## 2017-06-20 NOTE — ED Provider Notes (Signed)
[facility] Provider Note   CSN: [id number] Arrival date & time: 06/20/17  1101     History   Chief Complaint Chief Complaint  Patient presents with  . Fever  . Generalized Body Aches    HPI Audrey Pearson is a 27 y.o. female.  HPI  Patient is a 27 year old female with a history of asthma and IBS presenting for fever, myalgias cough, and congestion.  Patient reports that she began having her symptoms approximately 2 days ago, and started with rhinorrhea. Patient reports fever of 101 this morning. Patient reports that over 24 hours she rapidly progressed to have myalgias, breaking out in sweats at work.  Patient had a frontal headache without any diplopia, or persistent blurred vision, or neck stiffness.  Patient reports that she has had to take her albuterol inhaler more than usual due to this.  Patient is not typically followed for her asthma symptoms by primary care provider.   Patient denies any chest pain, shortness of breath, or presyncope.  Patient had an episode of emesis with nausea yesterday.  Patient reports she has Tylenol twice a day for her fever, but has not had relief of her myalgias.   Past Medical History:  Diagnosis Date  . Asthma   . IBS (irritable bowel syndrome)     Patient Active Problem List   Diagnosis Date Noted  . IBS (irritable bowel syndrome) 08/03/2016  . Sickle cell trait (HCC) 06/16/2016  . Mild intermittent asthma without complication 06/13/2016    Past Surgical History:  Procedure Laterality Date  . NO PAST SURGERIES      OB History    Gravida Para Term Preterm AB Living   2 1 1  0 1 1   SAB TAB Ectopic Multiple Live Births   1 0 0 0 1       Home Medications    Prior to Admission medications   Medication Sig Start Date End Date Taking? Authorizing Provider  acetaminophen (TYLENOL) 500 MG tablet Take 1,000 mg by mouth every 6 (six) hours as needed for mild pain.    [provider]    albuterol (PROVENTIL HFA;VENTOLIN HFA) 108 (90 Base) MCG/ACT inhaler Inhale 2 puffs into the lungs every 6 (six) hours as needed for wheezing or shortness of breath.    [provider]  diphenhydrAMINE (BENADRYL) 25 MG tablet Take 1 tablet (25 mg total) by mouth every 6 (six) hours. Patient not taking: Reported on 05/01/2017 02/14/17   Fayrene Helperran, [place], PA-C  ibuprofen (ADVIL,MOTRIN) 600 MG tablet Take 1 tablet (600 mg total) by mouth every 6 (six) hours as needed. Patient not taking: Reported on 05/01/2017 04/06/17   Lavera GuiseLiu, Dana Duo, MD  ondansetron (ZOFRAN) 4 MG tablet Take 1 tablet (4 mg total) by mouth every 6 (six) hours as needed for nausea or vomiting. Patient not taking: Reported on 05/01/2017 04/06/17   Lavera GuiseLiu, Dana Duo, MD  potassium chloride SA (K-DUR,KLOR-CON) 20 MEQ tablet Take 2 tablets (40 mEq total) by mouth 2 (two) times daily for 7 days. Patient not taking: Reported on 05/01/2017 04/06/17 04/13/17  Lavera GuiseLiu, Dana Duo, MD  predniSONE (DELTASONE) 20 MG tablet 2 tabs po daily x 4 days Patient not taking: Reported on 05/01/2017 02/14/17   Fayrene Helperran, [place], PA-C    Family History Family History  Adopted: Yes  Problem Relation Age of Onset  . Breast cancer Mother     Social History Social History   Tobacco Use  . Smoking  status: Former Smoker    Types: Cigarettes    Last attempt to quit: 05/15/2015    Years since quitting: 2.1  . Smokeless tobacco: Never Used  Substance Use Topics  . Alcohol use: No    Comment: occ  . Drug use: No     Allergies   Patient has no known allergies.   Review of Systems Review of Systems  Constitutional: Positive for chills and fever.  HENT: Positive for congestion, ear pain and rhinorrhea. Negative for ear discharge, sore throat and trouble swallowing.   Respiratory: Positive for cough. Negative for shortness of breath and stridor.   Gastrointestinal: Positive for nausea and vomiting.  Musculoskeletal: Positive for myalgias.  Skin: Negative  for rash.  Neurological: Negative for dizziness and light-headedness.     Physical Exam Updated Vital Signs BP 128/78 (BP Location: Right Arm)   Pulse 98   Temp 98.8 F (37.1 C) (Oral)   Resp 18   SpO2 100%   Physical Exam  Constitutional: She appears well-developed and well-nourished. No distress.  Sitting comfortably in bed.  HENT:  Head: Normocephalic and atraumatic.  Right Ear: Tympanic membrane normal.  Left Ear: Tympanic membrane normal.  Mouth/Throat: Oropharynx is clear and moist.  Eyes: Conjunctivae are normal. Right eye exhibits no discharge. Left eye exhibits no discharge.  EOMs normal to gross examination.  Neck: Normal range of motion.  Cardiovascular: Normal rate and regular rhythm.  Intact, 2+ radial pulse.  Non-tachycardic.  Pulmonary/Chest: Effort normal and breath sounds normal. She has no wheezes. She has no rales.  Normal respiratory effort. Patient converses comfortably. No audible wheeze or stridor.  Abdominal: She exhibits no distension.  Musculoskeletal: Normal range of motion.  Lymphadenopathy:    She has no cervical adenopathy.  Neurological: She is alert.  Cranial nerves intact to gross observation. Patient moves extremities without difficulty. Patient ambulates symmetrically with good coordination.  Skin: Skin is warm and dry. She is not diaphoretic.  Psychiatric: She has a normal mood and affect. Her behavior is normal. Judgment and thought content normal.  Nursing note and vitals reviewed.    ED Treatments / Results  Labs (all labs ordered are listed, but only abnormal results are displayed) Labs Reviewed  INFLUENZA PANEL BY PCR (TYPE A & B)  POC URINE PREG, ED    EKG  EKG Interpretation None       Radiology No results found.  Procedures Procedures (including critical care time)  Medications Ordered in ED Medications  acetaminophen (TYLENOL) tablet 325 mg (650 mg Oral Given 06/20/17 1301)  albuterol (PROVENTIL HFA;VENTOLIN  HFA) 108 (90 Base) MCG/ACT inhaler 1 puff (1 puff Inhalation Given 06/20/17 1557)     Initial Impression / Assessment and Plan / ED Course  I have reviewed the triage vital signs and the nursing notes.  Pertinent labs & imaging results that were available during my care of the patient were reviewed by me and considered in my medical decision making (see chart for details).     Final Clinical Impressions(s) / ED Diagnoses   Final diagnoses:  Influenza-like illness   Patient is nontoxic-appearing, afebrile at this time (has taken antipyretics and disease, and in no acute distress at this time.  Lungs are clear to auscultation.  Do not suspect pneumonia.  Patient is exhibiting flulike course and illness.  Patient has a history of asthma, so per CDC recommendations, Tamiflu treatment is warranted at this time.  I discussed with patient that she will need to follow-up  on her my chart for results of her influenza testing and discontinue her Tamiflu if it is negative.  Return precautions were given for any intractable nausea or vomiting, dizziness, lightheadedness, syncope or presyncope, chest pain, shortness of breath.  Albuterol inhaler was refilled today.  Patient encouraged to establish care at [facility] and wellness.  Patient is in understanding and agrees with the plan of care.  ED Discharge Orders    None       Delia Chimes 06/20/17 1611    Nira Conn, MD 06/22/17 1100

## 2017-06-20 NOTE — ED Notes (Signed)
Pt reports she has had flu like symptoms X3 days. Pt. Eating and drinking when brought back to the room.

## 2017-09-16 ENCOUNTER — Inpatient Hospital Stay (HOSPITAL_COMMUNITY): Payer: Medicaid Other

## 2017-09-16 ENCOUNTER — Other Ambulatory Visit: Payer: Self-pay

## 2017-09-16 ENCOUNTER — Inpatient Hospital Stay (HOSPITAL_COMMUNITY)
Admission: AD | Admit: 2017-09-16 | Discharge: 2017-09-16 | Disposition: A | Discharge status: Home / Self Care | Payer: Medicaid Other | Source: Ambulatory Visit | Attending: Obstetrics & Gynecology | Admitting: Obstetrics & Gynecology

## 2017-09-16 ENCOUNTER — Encounter (HOSPITAL_COMMUNITY): Payer: Self-pay | Admitting: *Deleted

## 2017-09-16 DIAGNOSIS — B9689 Other specified bacterial agents as the cause of diseases classified elsewhere: Secondary | ICD-10-CM | POA: Diagnosis not present

## 2017-09-16 DIAGNOSIS — Z3A01 Less than 8 weeks gestation of pregnancy: Secondary | ICD-10-CM | POA: Diagnosis not present

## 2017-09-16 DIAGNOSIS — O219 Vomiting of pregnancy, unspecified: Secondary | ICD-10-CM | POA: Diagnosis not present

## 2017-09-16 DIAGNOSIS — O99331 Smoking (tobacco) complicating pregnancy, first trimester: Secondary | ICD-10-CM | POA: Insufficient documentation

## 2017-09-16 DIAGNOSIS — O26891 Other specified pregnancy related conditions, first trimester: Secondary | ICD-10-CM

## 2017-09-16 DIAGNOSIS — N760 Acute vaginitis: Secondary | ICD-10-CM

## 2017-09-16 DIAGNOSIS — O3680X0 Pregnancy with inconclusive fetal viability, not applicable or unspecified: Secondary | ICD-10-CM

## 2017-09-16 DIAGNOSIS — R109 Unspecified abdominal pain: Secondary | ICD-10-CM | POA: Insufficient documentation

## 2017-09-16 DIAGNOSIS — O23591 Infection of other part of genital tract in pregnancy, first trimester: Secondary | ICD-10-CM | POA: Insufficient documentation

## 2017-09-16 DIAGNOSIS — O210 Mild hyperemesis gravidarum: Secondary | ICD-10-CM | POA: Insufficient documentation

## 2017-09-16 DIAGNOSIS — O26899 Other specified pregnancy related conditions, unspecified trimester: Secondary | ICD-10-CM

## 2017-09-16 DIAGNOSIS — F17210 Nicotine dependence, cigarettes, uncomplicated: Secondary | ICD-10-CM | POA: Diagnosis not present

## 2017-09-16 DIAGNOSIS — R1110 Vomiting, unspecified: Secondary | ICD-10-CM | POA: Diagnosis present

## 2017-09-16 DIAGNOSIS — Z6790 Unspecified blood type, Rh positive: Secondary | ICD-10-CM

## 2017-09-16 LAB — URINALYSIS, ROUTINE W REFLEX MICROSCOPIC
BILIRUBIN URINE: NEGATIVE
Glucose, UA: NEGATIVE mg/dL
HGB URINE DIPSTICK: NEGATIVE
Ketones, ur: NEGATIVE mg/dL
Leukocytes, UA: NEGATIVE
NITRITE: NEGATIVE
PH: 5 (ref 5.0–8.0)
Protein, ur: NEGATIVE mg/dL
SPECIFIC GRAVITY, URINE: 1.015 (ref 1.005–1.030)

## 2017-09-16 LAB — WET PREP, GENITAL
SPERM: NONE SEEN
TRICH WET PREP: NONE SEEN
Yeast Wet Prep HPF POC: NONE SEEN

## 2017-09-16 LAB — CBC
HEMATOCRIT: 39.6 % (ref 36.0–46.0)
HEMOGLOBIN: 13.7 g/dL (ref 12.0–15.0)
MCH: 29.8 pg (ref 26.0–34.0)
MCHC: 34.6 g/dL (ref 30.0–36.0)
MCV: 86.1 fL (ref 78.0–100.0)
Platelets: 208 10*3/uL (ref 150–400)
RBC: 4.6 MIL/uL (ref 3.87–5.11)
RDW: 13.2 % (ref 11.5–15.5)
WBC: 8.2 10*3/uL (ref 4.0–10.5)

## 2017-09-16 LAB — POCT PREGNANCY, URINE: Preg Test, Ur: POSITIVE — AB

## 2017-09-16 LAB — HCG, QUANTITATIVE, PREGNANCY: hCG, Beta Chain, Quant, S: 3018 m[IU]/mL — ABNORMAL HIGH (ref <5)

## 2017-09-16 MED ORDER — SCOPOLAMINE 1 MG/3DAYS TD PT72: 1.0000 | MEDICATED_PATCH | TRANSDERMAL | 1 refills | Status: DC

## 2017-09-16 MED ORDER — METOCLOPRAMIDE HCL 10 MG PO TABS
10.0000 mg | ORAL_TABLET | Freq: Four times a day (QID) | ORAL | Status: DC | PRN
  Administered 2017-09-16: 10 mg via ORAL
  Filled 2017-09-16: qty 1

## 2017-09-16 MED ORDER — SCOPOLAMINE 1 MG/3DAYS TD PT72
1.0000 | MEDICATED_PATCH | TRANSDERMAL | Status: DC
  Administered 2017-09-16: 1.5 mg via TRANSDERMAL
  Filled 2017-09-16: qty 1

## 2017-09-16 MED ORDER — METOCLOPRAMIDE HCL 10 MG PO TABS: 10.0000 mg | ORAL_TABLET | Freq: Four times a day (QID) | ORAL | 1 refills | Status: DC | PRN

## 2017-09-16 MED ORDER — METRONIDAZOLE 500 MG PO TABS: 500.0000 mg | ORAL_TABLET | Freq: Two times a day (BID) | ORAL | 0 refills | Status: DC

## 2017-09-16 NOTE — Discharge Instructions (Signed)
Abdominal Pain During Pregnancy °Abdominal pain is common in pregnancy. Most of the time, it does not cause harm. There are many causes of abdominal pain. Some causes are more serious than others and sometimes the cause is not known. Abdominal pain can be a sign that something is very wrong with the pregnancy or the pain may have nothing to do with the pregnancy. Always tell your health care provider if you have any abdominal pain. °Follow these instructions at home: °· Do not have sex or put anything in your vagina until your symptoms go away completely. °· Watch your abdominal pain for any changes. °· Get plenty of rest until your pain improves. °· Drink enough fluid to keep your urine clear or pale yellow. °· Take over-the-counter or prescription medicines only as told by your health care provider. °· Keep all follow-up visits as told by your health care provider. This is important. °Contact a health care provider if: °· You have a fever. °· Your pain gets worse or you have cramping. °· Your pain continues after resting. °Get help right away if: °· You are bleeding, leaking fluid, or passing tissue from the vagina. °· You have vomiting or diarrhea that does not go away. °· You have painful or bloody urination. °· You notice a decrease in your baby's movements. °· You feel very weak or faint. °· You have shortness of breath. °· You develop a severe headache with abdominal pain. °· You have abnormal vaginal discharge with abdominal pain. °This information is not intended to replace advice given to you by your health care provider. Make sure you discuss any questions you have with your health care provider. °Document Released: 04/30/2005 Document Revised: 02/09/2016 Document Reviewed: 11/27/2012 °Elsevier Interactive Patient Education © 2018 Elsevier Inc. ° °Bacterial Vaginosis °Bacterial vaginosis is an infection of the vagina. It happens when too many germs (bacteria) grow in the vagina. This infection puts you at  risk for infections from sex (STIs). Treating this infection can lower your risk for some STIs. You should also treat this if you are pregnant. It can cause your baby to be born early. °Follow these instructions at home: °Medicines °· Take over-the-counter and prescription medicines only as told by your doctor. °· Take or use your antibiotic medicine as told by your doctor. Do not stop taking or using it even if you start to feel better. °General instructions °· If you your sexual partner is a woman, tell her that you have this infection. She needs to get treatment if she has symptoms. If you have a female partner, he does not need to be treated. °· During treatment: °? Avoid sex. °? Do not douche. °? Avoid alcohol as told. °? Avoid breastfeeding as told. °· Drink enough fluid to keep your pee (urine) clear or pale yellow. °· Keep your vagina and butt (rectum) clean. °? Wash the area with warm water every day. °? Wipe from front to back after you use the toilet. °· Keep all follow-up visits as told by your doctor. This is important. °Preventing this condition °· Do not douche. °· Use only warm water to wash around your vagina. °· Use protection when you have sex. This includes: °? Latex condoms. °? Dental dams. °· Limit how many people you have sex with. It is best to only have sex with the same person (be monogamous). °· Get tested for STIs. Have your partner get tested. °· Wear underwear that is cotton or lined with cotton. °· Avoid   tight pants and pantyhose. This is most important in summer.  Do not use any products that have nicotine or tobacco in them. These include cigarettes and e-cigarettes. If you need help quitting, ask your doctor.  Do not use illegal drugs.  Limit how much alcohol you drink. Contact a doctor if:  Your symptoms do not get better, even after you are treated.  You have more discharge or pain when you pee (urinate).  You have a fever.  You have pain in your belly  (abdomen).  You have pain with sex.  Your bleed from your vagina between periods. Summary  This infection happens when too many germs (bacteria) grow in the vagina.  Treating this condition can lower your risk for some infections from sex (STIs).  You should also treat this if you are pregnant. It can cause early (premature) birth.  Do not stop taking or using your antibiotic medicine even if you start to feel better. This information is not intended to replace advice given to you by your health care provider. Make sure you discuss any questions you have with your health care provider. Document Released: 02/07/2008 Document Revised: 01/14/2016 Document Reviewed: 01/14/2016 Elsevier Interactive Patient Education  2017 Else[employer] Inc.  Morning Sickness Morning sickness is when you feel sick to your stomach (nauseous) during pregnancy. You may feel sick to your stomach and throw up (vomit). You may feel sick in the morning, but you can feel this way any time of day. Some women feel very sick to their stomach and cannot stop throwing up (hyperemesis gravidarum). Follow these instructions at home:  Only take medicines as told by your doctor.  Take multivitamins as told by your doctor. Taking multivitamins before getting pregnant can stop or lessen the harshness of morning sickness.  Eat dry toast or unsalted crackers before getting out of bed.  Eat 5 to 6 small meals a day.  Eat dry and bland foods like rice and baked potatoes.  Do not drink liquids with meals. Drink between meals.  Do not eat greasy, fatty, or spicy foods.  Have someone cook for you if the smell of food causes you to feel sick or throw up.  If you feel sick to your stomach after taking prenatal vitamins, take them at night or with a snack.  Eat protein when you need a snack (nuts, yogurt, cheese).  Eat unsweetened gelatins for dessert.  Wear a bracelet used for sea sickness (acupressure wristband).  Go to a  doctor that puts thin needles into certain body points (acupuncture) to improve how you feel.  Do not smoke.  Use a humidifier to keep the air in your house free of odors.  Get lots of fresh air. Contact a doctor if:  You need medicine to feel better.  You feel dizzy or lightheaded.  You are losing weight. Get help right away if:  You feel very sick to your stomach and cannot stop throwing up.  You pass out (faint). This information is not intended to replace advice given to you by your health care provider. Make sure you discuss any questions you have with your health care provider. Document Released: 06/07/2004 Document Revised: 10/06/2015 Document Reviewed: 10/15/2012 Elsevier Interactive Patient Education  2017 [employer].
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
## 2017-09-16 NOTE — MAU Note (Signed)
Pt presents with c/o vomiting for past 3 days.  States can keep liquids down but not food.  Reports +UPT today.

## 2017-09-17 LAB — GC/CHLAMYDIA PROBE AMP (~~LOC~~) NOT AT ARMC
Chlamydia: POSITIVE — AB
Neisseria Gonorrhea: NEGATIVE

## 2017-09-18 ENCOUNTER — Ambulatory Visit (INDEPENDENT_AMBULATORY_CARE_PROVIDER_SITE_OTHER): Payer: Self-pay | Admitting: General Practice

## 2017-09-18 ENCOUNTER — Ambulatory Visit: Payer: Medicaid Other | Admitting: Clinical

## 2017-09-18 ENCOUNTER — Ambulatory Visit: Payer: Medicaid Other | Admitting: *Deleted

## 2017-09-18 VITALS — BP 125/61 | HR 98 | Ht 66.0 in | Wt 137.0 lb

## 2017-09-18 DIAGNOSIS — A749 Chlamydial infection, unspecified: Secondary | ICD-10-CM

## 2017-09-18 DIAGNOSIS — Z640 Problems related to unwanted pregnancy: Secondary | ICD-10-CM

## 2017-09-18 DIAGNOSIS — O3680X0 Pregnancy with inconclusive fetal viability, not applicable or unspecified: Secondary | ICD-10-CM

## 2017-09-18 LAB — HCG, QUANTITATIVE, PREGNANCY: HCG, BETA CHAIN, QUANT, S: 6477 m[IU]/mL — AB (ref <5)

## 2017-09-18 MED ORDER — AZITHROMYCIN 250 MG PO TABS
1000.0000 mg | ORAL_TABLET | Freq: Once | ORAL | Status: AC
  Administered 2017-09-18: 1000 mg via ORAL

## 2017-09-18 NOTE — Progress Notes (Signed)
Patient informed of + CT result today by telephone call & came into office for treatment. Zithromax 1 gram given per protocol. Patient had no questions.

## 2017-09-18 NOTE — Progress Notes (Signed)
1100 Patient presented to clinic today for stat beta hcg. Patient stated she was in terrible pain yesterday and was told to return to the ED but she did not want to go. Pain is better today. No bleeding. Explained to patient the need to remain in waiting room to get results of her bloodwork so her plan of care may be updated accordingly. She stated she hopes that the pregnancy is ectopic and appeared very anxious. States she will be here for the results. 1330 Beta hcg result reviewed with Dr Jolayne Panther. Order for ultrasound one week after the first one received. Scheduled u/s for 5/14 . Informed patient of results and scheduled u/s. Patient became very emotional, shaking and crying. Offered to have her talk to [place] at this time and she agreed to this. Patient and/or legal guardian verbally consented to meet with Behavioral Health Clinician about presenting concerns. Patient brought to [facility] office.
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
## 2017-09-18 NOTE — Progress Notes (Signed)
I have reviewed the chart and agree with nursing staff's documentation of this patient's encounter.  Vonzella Nipple, PA-C 09/18/2017 2:58 PM

## 2017-09-24 ENCOUNTER — Encounter: Payer: Self-pay | Admitting: General Practice

## 2017-09-24 ENCOUNTER — Ambulatory Visit (HOSPITAL_COMMUNITY)
Admission: RE | Admit: 2017-09-24 | Discharge: 2017-09-24 | Disposition: A | Discharge status: Home / Self Care | Payer: Medicaid Other | Source: Ambulatory Visit | Attending: Obstetrics and Gynecology | Admitting: Obstetrics and Gynecology

## 2017-09-24 ENCOUNTER — Ambulatory Visit (INDEPENDENT_AMBULATORY_CARE_PROVIDER_SITE_OTHER): Payer: Self-pay

## 2017-09-24 DIAGNOSIS — O283 Abnormal ultrasonic finding on antenatal screening of mother: Secondary | ICD-10-CM | POA: Insufficient documentation

## 2017-09-24 DIAGNOSIS — Z3A01 Less than 8 weeks gestation of pregnancy: Secondary | ICD-10-CM | POA: Diagnosis not present

## 2017-09-24 DIAGNOSIS — O3680X0 Pregnancy with inconclusive fetal viability, not applicable or unspecified: Secondary | ICD-10-CM

## 2017-09-24 DIAGNOSIS — O3671X0 Maternal care for viable fetus in abdominal pregnancy, first trimester, not applicable or unspecified: Secondary | ICD-10-CM

## 2017-09-24 NOTE — Progress Notes (Signed)
Chart reviewed for nurse visit. Agree with plan of care.   Axton Cihlar Y, DO 09/24/2017 3:49 PM   

## 2017-09-24 NOTE — Progress Notes (Signed)
Pt came in for [place] results, Pregnancy is viable & based on [place] pt is currently [redacted]w[redacted]d.Asked pt has she decided on prenatal care and she stated [facility].

## 2017-10-10 ENCOUNTER — Encounter: Payer: Self-pay | Admitting: Certified Nurse Midwife

## 2017-10-10 ENCOUNTER — Inpatient Hospital Stay (HOSPITAL_COMMUNITY)
Admission: AD | Admit: 2017-10-10 | Discharge: 2017-10-11 | Disposition: A | Discharge status: Home / Self Care | Payer: Medicaid Other | Source: Ambulatory Visit | Attending: Obstetrics and Gynecology | Admitting: Obstetrics and Gynecology

## 2017-10-10 ENCOUNTER — Encounter (HOSPITAL_COMMUNITY): Payer: Self-pay

## 2017-10-10 DIAGNOSIS — O219 Vomiting of pregnancy, unspecified: Secondary | ICD-10-CM | POA: Diagnosis not present

## 2017-10-10 DIAGNOSIS — O99331 Smoking (tobacco) complicating pregnancy, first trimester: Secondary | ICD-10-CM | POA: Diagnosis not present

## 2017-10-10 DIAGNOSIS — O209 Hemorrhage in early pregnancy, unspecified: Secondary | ICD-10-CM | POA: Diagnosis not present

## 2017-10-10 DIAGNOSIS — R109 Unspecified abdominal pain: Secondary | ICD-10-CM | POA: Diagnosis present

## 2017-10-10 DIAGNOSIS — Z79899 Other long term (current) drug therapy: Secondary | ICD-10-CM | POA: Diagnosis not present

## 2017-10-10 DIAGNOSIS — O26891 Other specified pregnancy related conditions, first trimester: Secondary | ICD-10-CM | POA: Diagnosis not present

## 2017-10-10 DIAGNOSIS — R1013 Epigastric pain: Secondary | ICD-10-CM | POA: Diagnosis not present

## 2017-10-10 DIAGNOSIS — O2691 Pregnancy related conditions, unspecified, first trimester: Secondary | ICD-10-CM | POA: Diagnosis present

## 2017-10-10 DIAGNOSIS — R110 Nausea: Secondary | ICD-10-CM | POA: Diagnosis present

## 2017-10-10 DIAGNOSIS — N898 Other specified noninflammatory disorders of vagina: Secondary | ICD-10-CM

## 2017-10-10 DIAGNOSIS — O4690 Antepartum hemorrhage, unspecified, unspecified trimester: Secondary | ICD-10-CM

## 2017-10-10 DIAGNOSIS — Z3A08 8 weeks gestation of pregnancy: Secondary | ICD-10-CM | POA: Insufficient documentation

## 2017-10-10 LAB — WET PREP, GENITAL
Clue Cells Wet Prep HPF POC: NONE SEEN
Sperm: NONE SEEN
Trich, Wet Prep: NONE SEEN
Yeast Wet Prep HPF POC: NONE SEEN

## 2017-10-10 LAB — URINALYSIS, ROUTINE W REFLEX MICROSCOPIC
Bilirubin Urine: NEGATIVE
Glucose, UA: NEGATIVE mg/dL
Hgb urine dipstick: NEGATIVE
Ketones, ur: NEGATIVE mg/dL
Leukocytes, UA: NEGATIVE
Nitrite: NEGATIVE
Protein, ur: NEGATIVE mg/dL
Specific Gravity, Urine: 1.014 (ref 1.005–1.030)
pH: 6 (ref 5.0–8.0)

## 2017-10-10 MED ORDER — LACTATED RINGERS IV BOLUS: 1000.0000 mL | Freq: Once | INTRAVENOUS | Status: DC

## 2017-10-10 MED ORDER — ONDANSETRON HCL 4 MG/2ML IJ SOLN: 4.0000 mg | Freq: Once | INTRAMUSCULAR | Status: DC

## 2017-10-10 NOTE — MAU Note (Signed)
Pt reports nausea and vomiting for several days. Ran out of RX for Reglan. States she has been having constant abdominal pain and vomiting. States she keep fluid down, but cannot keep food down. Pt reports some spotting that she has had since she found out she was pregnant.

## 2017-10-11 DIAGNOSIS — N898 Other specified noninflammatory disorders of vagina: Secondary | ICD-10-CM

## 2017-10-11 DIAGNOSIS — O4690 Antepartum hemorrhage, unspecified, unspecified trimester: Secondary | ICD-10-CM

## 2017-10-11 DIAGNOSIS — O219 Vomiting of pregnancy, unspecified: Secondary | ICD-10-CM

## 2017-10-11 DIAGNOSIS — O26891 Other specified pregnancy related conditions, first trimester: Secondary | ICD-10-CM

## 2017-10-11 DIAGNOSIS — R1013 Epigastric pain: Secondary | ICD-10-CM

## 2017-10-11 MED ORDER — METOCLOPRAMIDE HCL 10 MG PO TABS: 10.0000 mg | ORAL_TABLET | Freq: Four times a day (QID) | ORAL | 3 refills | Status: DC | PRN

## 2017-10-11 MED ORDER — METOCLOPRAMIDE HCL 10 MG PO TABS
10.0000 mg | ORAL_TABLET | Freq: Once | ORAL | Status: AC
Start: 2017-10-11 — End: 2017-10-11
  Administered 2017-10-11: 10 mg via ORAL
  Filled 2017-10-11: qty 1

## 2017-10-11 NOTE — MAU Provider Note (Signed)
Chief Complaint: Abdominal Pain and Nausea   First Provider Initiated Contact with Patient 10/10/17 2305      SUBJECTIVE HPI: Audrey Pearson is a 27 y.o. G3P1011 at [redacted]w[redacted]d by LMP who presents to maternity admissions reporting abdominal pain and N/V. She reports N/V that has been present since finding out she was pregnant. Was taken Reglan for management of N/V but ran out of medicine 2-3 days ago. Symptoms restarted 2 days ago after not having any medication to take. She reports vomiting 4-5 times per day and not being able to keep food down, she reports being able to keep fluid down. Upper abdominal and epigastric pain is associated with the N/V due to repetitive vomiting throughout the day. She describes the abdominal pain as constant dull ache, rates pain 3/10- has not taken any medication for abdominal pain. She denies lower abdominal pain or cramping. She denies vaginal bleeding currently- did have spotting last week and since she found out she was pregnant. She denies vaginal itching/burning, urinary symptoms, h/a, dizziness or fever/chills.    Past Medical History:  Diagnosis Date  . Asthma   . IBS (irritable bowel syndrome)    Past Surgical History:  Procedure Laterality Date  . NO PAST SURGERIES     Social History   Socioeconomic History  . Marital status: Single    Spouse name: Not on file  . Number of children: Not on file  . Years of education: Not on file  . Highest education level: Not on file  Occupational History  . Not on file  Social Needs  . Financial resource strain: Not on file  . Food insecurity:    Worry: Not on file    Inability: Not on file  . Transportation needs:    Medical: Not on file    Non-medical: Not on file  Tobacco Use  . Smoking status: Current Every Day Smoker    Types: Cigarettes    Last attempt to quit: 05/15/2015    Years since quitting: 2.4  . Smokeless tobacco: Never Used  Substance and Sexual Activity  . Alcohol use: No    Comment: occ   . Drug use: No  . Sexual activity: Yes    Birth control/protection: None  Lifestyle  . Physical activity:    Days per week: Not on file    Minutes per session: Not on file  . Stress: Not on file  Relationships  . Social connections:    Talks on phone: Not on file    Gets together: Not on file    Attends religious service: Not on file    Active member of club or organization: Not on file    Attends meetings of clubs or organizations: Not on file    Relationship status: Not on file  . Intimate partner violence:    Fear of current or ex partner: Not on file    Emotionally abused: Not on file    Physically abused: Not on file    Forced sexual activity: Not on file  Other Topics Concern  . Not on file  Social History Narrative  . Not on file   No current facility-administered medications on file prior to encounter.    Current Outpatient Medications on File Prior to Encounter  Medication Sig Dispense Refill  . metoCLOPramide (REGLAN) 10 MG tablet Take 1 tablet (10 mg total) by mouth every 6 (six) hours as needed for nausea. 30 tablet 1  . metroNIDAZOLE (FLAGYL) 500 MG tablet Take  1 tablet (500 mg total) by mouth 2 (two) times daily. 14 tablet 0  . acetaminophen (TYLENOL) 500 MG tablet Take 1,000 mg by mouth every 6 (six) hours as needed for mild pain.    Marland Kitchen albuterol (PROVENTIL HFA;VENTOLIN HFA) 108 (90 Base) MCG/ACT inhaler Inhale 2 puffs into the lungs every 6 (six) hours as needed for wheezing or shortness of breath.    Marland Kitchen scopolamine (TRANSDERM-SCOP) 1 MG/3DAYS Place 1 patch (1.5 mg total) onto the skin every 3 (three) days. 10 patch 1   No Known Allergies  ROS:  Review of Systems  Constitutional: Negative.   Respiratory: Negative.   Cardiovascular: Negative.   Gastrointestinal: Positive for abdominal pain, nausea and vomiting. Negative for constipation and diarrhea.  Genitourinary: Positive for vaginal bleeding. Negative for difficulty urinating, dysuria, frequency, urgency  and vaginal discharge.       No bleeding currently    I have reviewed patient's Past Medical Hx, Surgical Hx, Family Hx, Social Hx, medications and allergies.   Physical Exam   Vitals:   10/10/17 2228 10/11/17 0027  BP: 133/82 123/69  Pulse: 92 73  Resp: 20 18  Temp: 99.3 F (37.4 C)   TempSrc: Oral   SpO2: 99%   Weight: 140 lb (63.5 kg)   Height:  (1.676 m)    Constitutional: Well-developed, well-nourished female in no acute distress.  Cardiovascular: normal rate Respiratory: normal effort GI: Abd soft, non-tender. Pos BS x 4 MS: Extremities nontender, no edema, normal ROM Neurologic: Alert and oriented x 4.   PELVIC EXAM: blind swabs obtained, scant white creamy discharge noted at vaginal introitus. Bimanual exam: Cervix 0/long/high, firm, anterior, neg CMT, uterus nontender, nonenlarged, adnexa without tenderness, enlargement, or mass  FHT 164 by Bedside ultrasound   Pt informed that the ultrasound is considered a limited OB ultrasound and is not intended to be a complete ultrasound exam.  Patient also informed that the ultrasound is not being completed with the intent of assessing for fetal or placental anomalies or any pelvic abnormalities.  Explained that the purpose of today's ultrasound is to assess for  viability.  Patient acknowledges the purpose of the exam and the limitations of the study.    LAB RESULTS Results for orders placed or performed during the hospital encounter of 10/10/17 (from the past 24 hour(s))  Urinalysis, Routine w reflex microscopic     Status: None   Collection Time: 10/10/17 10:17 PM  Result Value Ref Range   Color, Urine YELLOW YELLOW   APPearance CLEAR CLEAR   Specific Gravity, Urine 1.014 1.005 - 1.030   pH 6.0 5.0 - 8.0   Glucose, UA NEGATIVE NEGATIVE mg/dL   Hgb urine dipstick NEGATIVE NEGATIVE   Bilirubin Urine NEGATIVE NEGATIVE   Ketones, ur NEGATIVE NEGATIVE mg/dL   Protein, ur NEGATIVE NEGATIVE mg/dL   Nitrite NEGATIVE  NEGATIVE   Leukocytes, UA NEGATIVE NEGATIVE  Wet prep, genital     Status: Abnormal   Collection Time: 10/10/17 11:23 PM  Result Value Ref Range   Yeast Wet Prep HPF POC NONE SEEN NONE SEEN   Trich, Wet Prep NONE SEEN NONE SEEN   Clue Cells Wet Prep HPF POC NONE SEEN NONE SEEN   WBC, Wet Prep HPF POC MODERATE (A) NONE SEEN   Sperm NONE SEEN     --/--/O POS, O POS (08/08 0507)  MAU Management/MDM: Orders Placed This Encounter  Procedures  . Wet prep, genital  . Urinalysis, Routine w reflex microscopic  .  Discharge patient Discharge disposition: 01-Home or Self Care; Discharge patient date: 10/11/2017   Wet prep- negative  GC/C- pending UA- negative, no ketones or nitrates in urine   Meds ordered this encounter  Medications  . metoCLOPramide (REGLAN) tablet 10 mg  . metoCLOPramide (REGLAN) 10 MG tablet    Sig: Take 1 tablet (10 mg total) by mouth every 6 (six) hours as needed for nausea or vomiting.    Dispense:  30 tablet    Refill:  3    Order Specific Question:   Supervising Provider    Answer:   Duane Lope H [[id number]]   Treatments in MAU included Reglan  tablet PO in MAU- patient able to keep crackers and juice down prior to discharge home. Pt discharged. Pt stable at time of discharge. Rx for Reglan sent to pharmacy of choice.  ASSESSMENT 1. Nausea and vomiting during pregnancy   2. Vaginal discharge during pregnancy in first trimester   3. Vaginal bleeding during pregnancy   4. Epigastric abdominal pain affecting pregnancy in first trimester     PLAN Discharge home Make initial prenatal appointment Return to MAU as needed  Rx for Reglan sent to  Pharmacy of choice  Safe medications to take in pregnancy discussed   Follow-up Information    [facility] Follow up.   Specialty:  Obstetrics and Gynecology Why:  Make appointment to be seen for prenatal care  Contact information: [address], [address] [place]  [place] [address] [phone]          Allergies as of 10/11/2017   No Known Allergies     Medication List    TAKE these medications   acetaminophen 500 MG tablet Commonly known as:  TYLENOL Take 1,000 mg by mouth every 6 (six) hours as needed for mild pain.   albuterol 108 (90 Base) MCG/ACT inhaler Commonly known as:  PROVENTIL HFA;VENTOLIN HFA Inhale 2 puffs into the lungs every 6 (six) hours as needed for wheezing or shortness of breath.   metoCLOPramide 10 MG tablet Commonly known as:  REGLAN Take 1 tablet (10 mg total) by mouth every 6 (six) hours as needed for nausea. What changed:  Another medication with the same name was added. Make sure you understand how and when to take each.   metoCLOPramide 10 MG tablet Commonly known as:  REGLAN Take 1 tablet (10 mg total) by mouth every 6 (six) hours as needed for nausea or vomiting. What changed:  You were already taking a medication with the same name, and this prescription was added. Make sure you understand how and when to take each.   metroNIDAZOLE 500 MG tablet Commonly known as:  FLAGYL Take 1 tablet (500 mg total) by mouth 2 (two) times daily.   scopolamine 1 MG/3DAYS Commonly known as:  TRANSDERM-SCOP Place 1 patch (1.5 mg total) onto the skin every 3 (three) days.      Steward Drone  Certified Nurse-Midwife 10/11/2017  11:52 PM

## 2017-10-14 ENCOUNTER — Other Ambulatory Visit: Payer: Self-pay | Admitting: Certified Nurse Midwife

## 2017-10-14 LAB — GC/CHLAMYDIA PROBE AMP (~~LOC~~) NOT AT ARMC
Chlamydia: NEGATIVE
Neisseria Gonorrhea: NEGATIVE

## 2017-10-31 ENCOUNTER — Encounter (HOSPITAL_COMMUNITY): Payer: Self-pay

## 2017-10-31 ENCOUNTER — Inpatient Hospital Stay (HOSPITAL_COMMUNITY)
Admission: AD | Admit: 2017-10-31 | Discharge: 2017-10-31 | Disposition: A | Discharge status: Home / Self Care | Payer: Medicaid Other | Source: Ambulatory Visit | Attending: Obstetrics & Gynecology | Admitting: Obstetrics & Gynecology

## 2017-10-31 ENCOUNTER — Other Ambulatory Visit: Payer: Self-pay

## 2017-10-31 DIAGNOSIS — F419 Anxiety disorder, unspecified: Secondary | ICD-10-CM | POA: Diagnosis not present

## 2017-10-31 DIAGNOSIS — O210 Mild hyperemesis gravidarum: Secondary | ICD-10-CM | POA: Insufficient documentation

## 2017-10-31 DIAGNOSIS — O99611 Diseases of the digestive system complicating pregnancy, first trimester: Secondary | ICD-10-CM | POA: Diagnosis not present

## 2017-10-31 DIAGNOSIS — O99511 Diseases of the respiratory system complicating pregnancy, first trimester: Secondary | ICD-10-CM | POA: Insufficient documentation

## 2017-10-31 DIAGNOSIS — Z813 Family history of other psychoactive substance abuse and dependence: Secondary | ICD-10-CM | POA: Insufficient documentation

## 2017-10-31 DIAGNOSIS — J45909 Unspecified asthma, uncomplicated: Secondary | ICD-10-CM | POA: Diagnosis not present

## 2017-10-31 DIAGNOSIS — Z832 Family history of diseases of the blood and blood-forming organs and certain disorders involving the immune mechanism: Secondary | ICD-10-CM | POA: Insufficient documentation

## 2017-10-31 DIAGNOSIS — Z803 Family history of malignant neoplasm of breast: Secondary | ICD-10-CM | POA: Insufficient documentation

## 2017-10-31 DIAGNOSIS — R109 Unspecified abdominal pain: Secondary | ICD-10-CM | POA: Diagnosis not present

## 2017-10-31 DIAGNOSIS — Z79899 Other long term (current) drug therapy: Secondary | ICD-10-CM | POA: Diagnosis not present

## 2017-10-31 DIAGNOSIS — K589 Irritable bowel syndrome without diarrhea: Secondary | ICD-10-CM | POA: Insufficient documentation

## 2017-10-31 DIAGNOSIS — Z811 Family history of alcohol abuse and dependence: Secondary | ICD-10-CM | POA: Insufficient documentation

## 2017-10-31 DIAGNOSIS — F329 Major depressive disorder, single episode, unspecified: Secondary | ICD-10-CM | POA: Diagnosis not present

## 2017-10-31 DIAGNOSIS — O99341 Other mental disorders complicating pregnancy, first trimester: Secondary | ICD-10-CM | POA: Diagnosis not present

## 2017-10-31 DIAGNOSIS — Z3A11 11 weeks gestation of pregnancy: Secondary | ICD-10-CM

## 2017-10-31 DIAGNOSIS — Z87891 Personal history of nicotine dependence: Secondary | ICD-10-CM | POA: Insufficient documentation

## 2017-10-31 DIAGNOSIS — R42 Dizziness and giddiness: Secondary | ICD-10-CM | POA: Insufficient documentation

## 2017-10-31 DIAGNOSIS — O26891 Other specified pregnancy related conditions, first trimester: Secondary | ICD-10-CM | POA: Insufficient documentation

## 2017-10-31 HISTORY — DX: Major depressive disorder, single episode, unspecified: F32.9

## 2017-10-31 HISTORY — DX: Depression, unspecified: F32.A

## 2017-10-31 HISTORY — DX: Anxiety disorder, unspecified: F41.9

## 2017-10-31 LAB — COMPREHENSIVE METABOLIC PANEL
ALT: 11 U/L — ABNORMAL LOW (ref 14–54)
ANION GAP: 9 (ref 5–15)
AST: 14 U/L — AB (ref 15–41)
Albumin: 3.7 g/dL (ref 3.5–5.0)
Alkaline Phosphatase: 41 U/L (ref 38–126)
BILIRUBIN TOTAL: 0.4 mg/dL (ref 0.3–1.2)
BUN: 7 mg/dL (ref 6–20)
CHLORIDE: 104 mmol/L (ref 101–111)
CO2: 21 mmol/L — AB (ref 22–32)
Calcium: 9.1 mg/dL (ref 8.9–10.3)
Creatinine, Ser: 0.45 mg/dL (ref 0.44–1.00)
GFR calc Af Amer: >60 mL/min (ref >60)
GFR calc non Af Amer: >60 mL/min (ref >60)
Glucose, Bld: 91 mg/dL (ref 65–99)
POTASSIUM: 3.8 mmol/L (ref 3.5–5.1)
SODIUM: 134 mmol/L — AB (ref 135–145)
TOTAL PROTEIN: 6.4 g/dL — AB (ref 6.5–8.1)

## 2017-10-31 LAB — URINALYSIS, ROUTINE W REFLEX MICROSCOPIC
Bilirubin Urine: NEGATIVE
GLUCOSE, UA: NEGATIVE mg/dL
HGB URINE DIPSTICK: NEGATIVE
KETONES UR: NEGATIVE mg/dL
LEUKOCYTES UA: NEGATIVE
Nitrite: NEGATIVE
PROTEIN: NEGATIVE mg/dL
Specific Gravity, Urine: 1.012 (ref 1.005–1.030)
pH: 6 (ref 5.0–8.0)

## 2017-10-31 LAB — CBC
HCT: 34 % — ABNORMAL LOW (ref 36.0–46.0)
Hemoglobin: 12.1 g/dL (ref 12.0–15.0)
MCH: 30.6 pg (ref 26.0–34.0)
MCHC: 35.6 g/dL (ref 30.0–36.0)
MCV: 86.1 fL (ref 78.0–100.0)
Platelets: 220 10*3/uL (ref 150–400)
RBC: 3.95 MIL/uL (ref 3.87–5.11)
RDW: 13.5 % (ref 11.5–15.5)
WBC: 10.2 10*3/uL (ref 4.0–10.5)

## 2017-10-31 LAB — WET PREP, GENITAL
Clue Cells Wet Prep HPF POC: NONE SEEN
SPERM: NONE SEEN
TRICH WET PREP: NONE SEEN
Yeast Wet Prep HPF POC: NONE SEEN

## 2017-10-31 LAB — LIPASE, BLOOD: Lipase: 44 U/L (ref 11–51)

## 2017-10-31 MED ORDER — SCOPOLAMINE 1 MG/3DAYS TD PT72: 1.0000 | MEDICATED_PATCH | TRANSDERMAL | 1 refills | Status: DC

## 2017-10-31 MED ORDER — PROCHLORPERAZINE MALEATE 10 MG PO TABS: 10.0000 mg | ORAL_TABLET | Freq: Four times a day (QID) | ORAL | 1 refills | Status: DC | PRN

## 2017-10-31 MED ORDER — SCOPOLAMINE 1 MG/3DAYS TD PT72
1.0000 | MEDICATED_PATCH | TRANSDERMAL | Status: DC
  Administered 2017-10-31: 1.5 mg via TRANSDERMAL
  Filled 2017-10-31: qty 1

## 2017-10-31 MED ORDER — PROCHLORPERAZINE MALEATE 10 MG PO TABS
10.0000 mg | ORAL_TABLET | Freq: Four times a day (QID) | ORAL | Status: DC | PRN
  Administered 2017-10-31: 10 mg via ORAL
  Filled 2017-10-31 (×2): qty 1

## 2017-10-31 NOTE — MAU Note (Signed)
Overall cramping, dizzy spells. Has been cramping and just overall doesn't feel good. Started yesterday. Been vomiting.  Had stomach bug last week. Is constipated now.

## 2017-10-31 NOTE — MAU Provider Note (Signed)
History     CSN: [id number]  Arrival date and time: 10/31/17 1345   First Provider Initiated Contact with Patient 10/31/17 1438      Chief Complaint  Patient presents with  . Abdominal Pain  . Emesis  . constipated  . Dizziness   G3P1011 @11 .0 wks here with cramping, dizziness, constipation, and N/V. Sx started 3 weeks ago but have worsened in the last 2 days. Reports emesis 6-8 times a day. She can only tolerate oatmeal. No weight loss. She is using Reglan q6 hrs on most days. Last BM was 4 days ago. She states had "stomach bug" last week. Denies fevers. No urinary sx. No VB. Increased pale white vaginal discharge w/o odor. No new partner. No sexually active since last STD screen. She thinks the Scopolamine patch helped but she couldn't pick up Rx d/t insurance not covering. Has gained 2 pounds in last 3 weeks.  OB History    Gravida  3   Para  1   Term  1   Preterm  0   AB  1   Living  1     SAB  1   TAB  0   Ectopic  0   Multiple  0   Live Births  1           Past Medical History:  Diagnosis Date  . Anxiety   . Asthma   . Depression   . IBS (irritable bowel syndrome)     Past Surgical History:  Procedure Laterality Date  . NO PAST SURGERIES      Family History  Adopted: Yes  Problem Relation Age of Onset  . Breast cancer Mother   . Alcohol abuse Mother   . Drug abuse Mother   . Alcohol abuse Father   . Drug abuse Father   . Sickle cell anemia Sister   . Drug abuse Brother     Social History   Tobacco Use  . Smoking status: Former Smoker    Types: Cigarettes    Last attempt to quit: 05/15/2015    Years since quitting: 2.4  . Smokeless tobacco: Never Used  Substance Use Topics  . Alcohol use: No    Comment: occ  . Drug use: No    Allergies: No Known Allergies  Medications Prior to Admission  Medication Sig Dispense Refill Last Dose  . calcium carbonate (TUMS - DOSED IN MG ELEMENTAL CALCIUM) 500 MG chewable tablet Chew 1-2  tablets by mouth daily.   Past Week at Unknown time  . diphenhydrAMINE (BENADRYL) 2 % cream Apply 1 application topically 2 (two) times daily as needed for itching.   10/31/2017 at Unknown time  . metoCLOPramide (REGLAN) 10 MG tablet Take 1 tablet (10 mg total) by mouth every 6 (six) hours as needed for nausea. 30 tablet 1 10/31/2017 at Unknown time  . metroNIDAZOLE (FLAGYL) 500 MG tablet Take 1 tablet (500 mg total) by mouth 2 (two) times daily. (Patient not taking: Reported on 10/31/2017) 14 tablet 0 Not Taking at Unknown time    Review of Systems  Constitutional: Negative for chills, fever and unexpected weight change.  Gastrointestinal: Positive for abdominal pain, constipation, nausea and vomiting.  Genitourinary: Positive for vaginal discharge. Negative for dysuria, frequency, urgency and vaginal bleeding.  Neurological: Positive for dizziness.   Physical Exam   Blood pressure 114/66, pulse 85, temperature 98.6 F (37 C), temperature source Oral, resp. rate 16, weight 142 lb 4 oz (64.5 kg), last menstrual  period 08/12/2017, SpO2 98 %, not currently breastfeeding.  Physical Exam  Nursing note and vitals reviewed. Constitutional: She is oriented to person, place, and time. She appears well-developed and well-nourished. No distress.  HENT:  Head: Normocephalic and atraumatic.  Neck: Normal range of motion.  Cardiovascular: Normal rate.  Respiratory: Effort normal. No respiratory distress.  GI: Soft. Bowel sounds are normal. She exhibits no distension and no mass. There is tenderness in the suprapubic area. There is no rebound and no guarding.  Musculoskeletal: Normal range of motion.  Neurological: She is alert and oriented to person, place, and time.  Skin: Skin is warm and dry.  Psychiatric: She has a normal mood and affect.  FHT 152  Results for orders placed or performed during the hospital encounter of 10/31/17 (from the past 24 hour(s))  Urinalysis, Routine w reflex microscopic      Status: None   Collection Time: 10/31/17  2:13 PM  Result Value Ref Range   Color, Urine YELLOW YELLOW   APPearance CLEAR CLEAR   Specific Gravity, Urine 1.012 1.005 - 1.030   pH 6.0 5.0 - 8.0   Glucose, UA NEGATIVE NEGATIVE mg/dL   Hgb urine dipstick NEGATIVE NEGATIVE   Bilirubin Urine NEGATIVE NEGATIVE   Ketones, ur NEGATIVE NEGATIVE mg/dL   Protein, ur NEGATIVE NEGATIVE mg/dL   Nitrite NEGATIVE NEGATIVE   Leukocytes, UA NEGATIVE NEGATIVE  CBC     Status: Abnormal   Collection Time: 10/31/17  2:41 PM  Result Value Ref Range   WBC 10.2 4.0 - 10.5 K/uL   RBC 3.95 3.87 - 5.11 MIL/uL   Hemoglobin 12.1 12.0 - 15.0 g/dL   HCT 16.1 (L) 09.6 - 04.5 %   MCV 86.1 78.0 - 100.0 fL   MCH 30.6 26.0 - 34.0 pg   MCHC 35.6 30.0 - 36.0 g/dL   RDW 40.9 81.1 - 91.4 %   Platelets 220 150 - 400 K/uL  Comprehensive metabolic panel     Status: Abnormal   Collection Time: 10/31/17  2:42 PM  Result Value Ref Range   Sodium 134 (L) 135 - 145 mmol/L   Potassium 3.8 3.5 - 5.1 mmol/L   Chloride 104 101 - 111 mmol/L   CO2 21 (L) 22 - 32 mmol/L   Glucose, Bld 91 65 - 99 mg/dL   BUN 7 6 - 20 mg/dL   Creatinine, Ser 7.82 0.44 - 1.00 mg/dL   Calcium 9.1 8.9 - 95.6 mg/dL   Total Protein 6.4 (L) 6.5 - 8.1 g/dL   Albumin 3.7 3.5 - 5.0 g/dL   AST 14 (L) 15 - 41 U/L   ALT 11 (L) 14 - 54 U/L   Alkaline Phosphatase 41 38 - 126 U/L   Total Bilirubin 0.4 0.3 - 1.2 mg/dL   GFR calc non Af Amer >60 >60 mL/min   GFR calc Af Amer >60 >60 mL/min   Anion gap 9 5 - 15  Lipase, blood     Status: None   Collection Time: 10/31/17  2:42 PM  Result Value Ref Range   Lipase 44 11 - 51 U/L  Wet prep, genital     Status: Abnormal   Collection Time: 10/31/17  3:29 PM  Result Value Ref Range   Yeast Wet Prep HPF POC NONE SEEN NONE SEEN   Trich, Wet Prep NONE SEEN NONE SEEN   Clue Cells Wet Prep HPF POC NONE SEEN NONE SEEN   WBC, Wet Prep HPF POC MANY (A) NONE SEEN  Sperm NONE SEEN    MAU Course   Procedures Compazine Scopolamine  MDM Labs ordered and reviewed. UA normal, no evidence of dehydration, IVF not indicated today. Feels better. No emesis. Tolerating po. Stable for discharge home.   Assessment and Plan   1. [redacted] weeks gestation of pregnancy   2. Morning sickness    Discharge home Follow up at [facility] to start care Rx Scop patch Rx Compazine HEG diet  Allergies as of 10/31/2017   No Known Allergies     Medication List    STOP taking these medications   metoCLOPramide 10 MG tablet Commonly known as:  REGLAN   metroNIDAZOLE 500 MG tablet Commonly known as:  FLAGYL     TAKE these medications   calcium carbonate 500 MG chewable tablet Commonly known as:  TUMS - dosed in mg elemental calcium Chew 1-2 tablets by mouth daily.   diphenhydrAMINE 2 % cream Commonly known as:  BENADRYL Apply 1 application topically 2 (two) times daily as needed for itching.   prochlorperazine 10 MG tablet Commonly known as:  COMPAZINE Take 1 tablet (10 mg total) by mouth every 6 (six) hours as needed for nausea or vomiting.   scopolamine 1 MG/3DAYS Commonly known as:  TRANSDERM-SCOP Place 1 patch (1.5 mg total) onto the skin every 3 (three) days. Start taking on:  11/03/2017      Donette Larry, CNM 10/31/2017, 5:37 PM

## 2017-10-31 NOTE — Discharge Instructions (Signed)
Morning Sickness °Morning sickness is when you feel sick to your stomach (nauseous) during pregnancy. This nauseous feeling may or may not come with vomiting. It often occurs in the morning but can be a problem any time of day. Morning sickness is most common during the first trimester, but it may continue throughout pregnancy. While morning sickness is unpleasant, it is usually harmless unless you develop severe and continual vomiting (hyperemesis gravidarum). This condition requires more intense treatment. °What are the causes? °The cause of morning sickness is not completely known but seems to be related to normal hormonal changes that occur in pregnancy. °What increases the risk? °You are at greater risk if you: °· Experienced nausea or vomiting before your pregnancy. °· Had morning sickness during a previous pregnancy. °· Are pregnant with more than one baby, such as twins. ° °How is this treated? °Do not use any medicines (prescription, over-the-counter, or herbal) for morning sickness without first talking to your health care provider. Your health care provider may prescribe or recommend: °· Vitamin B6 supplements. °· Anti-nausea medicines. °· The herbal medicine ginger. ° °Follow these instructions at home: °· Only take over-the-counter or prescription medicines as directed by your health care provider. °· Taking multivitamins before getting pregnant can prevent or decrease the severity of morning sickness in most women. °· Eat a piece of dry toast or unsalted crackers before getting out of bed in the morning. °· Eat five or six small meals a day. °· Eat dry and bland foods (rice, baked potato). Foods high in carbohydrates are often helpful. °· Do not drink liquids with your meals. Drink liquids between meals. °· Avoid greasy, fatty, and spicy foods. °· Get someone to cook for you if the smell of any food causes nausea and vomiting. °· If you feel nauseous after taking prenatal vitamins, take the vitamins at  night or with a snack. °· Snack on protein foods (nuts, yogurt, cheese) between meals if you are hungry. °· Eat unsweetened gelatins for desserts. °· Wearing an acupressure wristband (worn for sea sickness) may be helpful. °· Acupuncture may be helpful. °· Do not smoke. °· Get a humidifier to keep the air in your house free of odors. °· Get plenty of fresh air. °Contact a health care provider if: °· Your home remedies are not working, and you need medicine. °· You feel dizzy or lightheaded. °· You are losing weight. °Get help right away if: °· You have persistent and uncontrolled nausea and vomiting. °· You pass out (faint). °This information is not intended to replace advice given to you by your health care provider. Make sure you discuss any questions you have with your health care provider. °Document Released: 06/21/2006 Document Revised: 10/06/2015 Document Reviewed: 10/15/2012 °Elsevier Interactive Patient Education © 2017 [employer] ° ° °Eating Plan for Hyperemesis Gravidarum °Hyperemesis gravidarum is a severe form of morning sickness. Because this condition causes severe nausea and vomiting, it can lead to dehydration, malnutrition, and weight loss. One way to lessen the symptoms of nausea and vomiting is to follow the eating plan for hyperemesis gravidarum. It is often used along with prescribed medicines to control your symptoms. °What can I do to relieve my symptoms? °Listen to your body. Everyone is different and has different preferences. Find what works best for you. Take any of the following actions that are helpful to you: °· Eat and drink slowly. °· Eat 5-6 small meals daily instead of 3 large meals. °· Eat crackers before you get   out of bed in the morning. °· Try having a snack in the middle of the night. °· Starchy foods are usually tolerated well. Examples include cereal, toast, bread, potatoes, pasta, rice, and pretzels. °· Ginger may help with nausea. Add ¼ tsp ground ginger to hot tea or  choose ginger tea. °· Try drinking 100% fruit juice or an electrolyte drink. An electrolyte drink contains sodium, potassium, and chloride. °· Continue to take your prenatal vitamins as told by your health care provider. If you are having trouble taking your prenatal vitamins, talk with your health care provider about different options. °· Include at least 1 serving of protein with your meals and snacks. Protein options include meats or poultry, beans, nuts, eggs, and yogurt. Try eating a protein-rich snack before bed. Examples of these snacks include cheese and crackers or half of a peanut butter or turkey sandwich. °· Consider eliminating foods that trigger your symptoms. These may include spicy foods, coffee, high-fat foods, very sweet foods, and acidic foods. °· Try meals that have more protein combined with bland, salty, lower-fat, and dry foods, such as nuts, seeds, pretzels, crackers, and cereal. °· Talk with your healthcare provider about starting a supplement of vitamin B6. °· Have fluids that are cold, clear, and carbonated or sour. Examples include lemonade, ginger ale, lemon-lime soda, ice water, and sparkling water. °· Try lemon or mint tea. °· Try brushing your teeth or using a mouth rinse after meals. ° °What should I avoid to reduce my symptoms? °Avoiding some of the following things may help reduce your symptoms. °· Foods with strong smells. Try eating meals in well-ventilated areas that are free of odors. °· Drinking water or other beverages with meals. Try not to drink anything during the 30 minutes before and after your meals. °· Drinking more than 1 cup of fluid at a time. Sometimes using a straw helps. °· Fried or high-fat foods, such as butter and cream sauces. °· Spicy foods. °· Skipping meals as best as you can. Nausea can be more intense on an empty stomach. If you cannot tolerate food at that time, do not force it. Try sucking on ice chips or other frozen items, and make up for missed  calories later. °· Lying down within 2 hours after eating. °· Environmental triggers. These may include smoky rooms, closed spaces, rooms with strong smells, warm or humid places, overly loud and noisy rooms, and rooms with motion or flickering lights. °· Quick and sudden changes in your movement. ° °This information is not intended to replace advice given to you by your health care provider. Make sure you discuss any questions you have with your health care provider. °Document Released: 02/25/2007 Document Revised: 12/28/2015 Document Reviewed: 11/29/2015 °Elsevier Interactive Patient Education © 2018 [employer] ° °

## 2017-11-19 ENCOUNTER — Encounter: Payer: Self-pay | Admitting: Advanced Practice Midwife

## 2017-11-19 ENCOUNTER — Other Ambulatory Visit (HOSPITAL_COMMUNITY)
Admission: RE | Admit: 2017-11-19 | Discharge: 2017-11-19 | Disposition: A | Discharge status: Home / Self Care | Payer: Medicaid Other | Source: Ambulatory Visit | Attending: Advanced Practice Midwife | Admitting: Advanced Practice Midwife

## 2017-11-19 ENCOUNTER — Ambulatory Visit (INDEPENDENT_AMBULATORY_CARE_PROVIDER_SITE_OTHER): Payer: Self-pay | Admitting: Advanced Practice Midwife

## 2017-11-19 VITALS — BP 116/66 | HR 85 | Wt 143.9 lb

## 2017-11-19 DIAGNOSIS — R109 Unspecified abdominal pain: Secondary | ICD-10-CM

## 2017-11-19 DIAGNOSIS — F329 Major depressive disorder, single episode, unspecified: Secondary | ICD-10-CM

## 2017-11-19 DIAGNOSIS — Z3490 Encounter for supervision of normal pregnancy, unspecified, unspecified trimester: Secondary | ICD-10-CM | POA: Insufficient documentation

## 2017-11-19 DIAGNOSIS — O0990 Supervision of high risk pregnancy, unspecified, unspecified trimester: Secondary | ICD-10-CM

## 2017-11-19 DIAGNOSIS — K581 Irritable bowel syndrome with constipation: Secondary | ICD-10-CM

## 2017-11-19 DIAGNOSIS — Z91410 Personal history of adult physical and sexual abuse: Secondary | ICD-10-CM

## 2017-11-19 DIAGNOSIS — O26891 Other specified pregnancy related conditions, first trimester: Secondary | ICD-10-CM

## 2017-11-19 DIAGNOSIS — O0991 Supervision of high risk pregnancy, unspecified, first trimester: Secondary | ICD-10-CM

## 2017-11-19 DIAGNOSIS — D573 Sickle-cell trait: Secondary | ICD-10-CM | POA: Insufficient documentation

## 2017-11-19 DIAGNOSIS — O99341 Other mental disorders complicating pregnancy, first trimester: Secondary | ICD-10-CM

## 2017-11-19 LAB — POCT URINALYSIS DIP (DEVICE)
Bilirubin Urine: NEGATIVE
Glucose, UA: NEGATIVE mg/dL
Hgb urine dipstick: NEGATIVE
KETONES UR: NEGATIVE mg/dL
Leukocytes, UA: NEGATIVE
Nitrite: NEGATIVE
PH: 6 (ref 5.0–8.0)
PROTEIN: NEGATIVE mg/dL
SPECIFIC GRAVITY, URINE: 1.015 (ref 1.005–1.030)
UROBILINOGEN UA: 0.2 mg/dL (ref 0.0–1.0)
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
## 2017-11-19 NOTE — Patient Instructions (Addendum)
[facility]  Mental health service in [place]  [address], [place], [place] [address]   Phone: [phone]  [facility] [place], [facility] for pregnant women [address] [address] ~ [place], [place] [address] [phone] ~ [phone] [email or web address]     Perinatal Depression When a woman feels excessive sadness, anger, or anxiety during pregnancy or during the first 12 months after she gives birth, she has a condition called perinatal depression. Depression can interfere with work, school, relationships, and other everyday activities. If it is not managed properly, it can also cause problems in the mother and her baby. Sometimes, perinatal depression is left untreated because symptoms are thought to be normal mood swings during and right after pregnancy. If you have symptoms of depression, it is important to talk with your health care provider. What are the causes? The exact cause of this condition is not known. Hormonal changes during and after pregnancy may play a role in causing perinatal depression. What increases the risk? You are more likely to develop this condition if:  You have a personal or family history of depression, anxiety, or mood disorders.  You experience a stressful life event during pregnancy, such as the death of a loved one.  You have a lot of regular life stress.  You do not have support from family members or loved ones, or you are in an abusive relationship.  What are the signs or symptoms? Symptoms of this condition include:  Feeling sad or hopeless.  Feelings of guilt.  Feeling irritable or overwhelmed.  Changes in your appetite.  Lack of energy or motivation.  Sleep problems.  Difficulty concentrating or completing tasks.  Loss of interest in hobbies or relationships.  Headaches or stomach problems that do not go away.  How is this diagnosed? This condition is diagnosed based on a physical exam and mental  evaluation. In some cases, your health care provider may use a depression screening tool. These tools include a list of questions that can help a health care provider diagnose depression. Your health care provider may refer you to a mental health expert who specializes in depression. How is this treated? This condition may be treated with:  Medicines. Your health care provider will only give you medicines that have been proven safe for pregnancy and breastfeeding.  Talk therapy with a mental health professional to help change your patterns of thinking (cognitive behavioral therapy).  Support groups.  Brain stimulation or light therapies.  Stress reduction therapies, such as mindfulness.  Follow these instructions at home: Lifestyle  Do not use any products that contain nicotine or tobacco, such as cigarettes and e-cigarettes. If you need help quitting, ask your health care provider.  Do not use alcohol when you are pregnant. After your baby is born, limit alcohol intake to no more than 1 drink a day. One drink equals 12 oz of beer, 5 oz of wine, or 1 oz of hard liquor.  Consider joining a support group for new mothers. Ask your health care provider for recommendations.  Take good care of yourself. Make sure you: ? Get plenty of sleep. If you are having trouble sleeping, talk with your health care provider. ? Eat a healthy diet. This includes plenty of fruits and vegetables, whole grains, and lean proteins. ? Exercise regularly, as told by your health care provider. Ask your health care provider what exercises are safe for you. General instructions  Take over-the-counter and prescription medicines only as told by  your health care provider.  Talk with your partner or family members about your feelings during pregnancy. Share any concerns or anxieties that you may have.  Ask for help with tasks or chores when you need it. Ask friends and family members to provide meals, watch your  children, or help with cleaning.  Keep all follow-up visits as told by your health care provider. This is important. Contact a health care provider if:  You (or people close to you) notice that you have any symptoms of depression.  You have depression and your symptoms get worse.  You experience side effects from medicines, such as nausea or sleep problems. Get help right away if:  You feel like hurting yourself, your baby, or someone else. If you ever feel like you may hurt yourself or others, or have thoughts about taking your own life, get help right away. You can go to your nearest emergency department or call:  Your local emergency services (911 in the [place]  A suicide crisis helpline, such as the National Suicide Prevention Lifeline at (815) 240-2238. This is open 24 hours a day.  Summary  Perinatal depression is when a woman feels excessive sadness, anger, or anxiety during pregnancy or during the first 12 months after she gives birth.  If perinatal depression is not treated, it can lead to health problems for the mother and her baby.  This condition is treated with medicines, talk therapy, stress reduction therapies, or a combination of two or more treatments.  Talk with your partner or family members about your feelings. Do not be afraid to ask for help. This information is not intended to replace advice given to you by your health care provider. Make sure you discuss any questions you have with your health care provider. Document Released: 06/27/2016 Document Revised: 06/27/2016 Document Reviewed: 06/27/2016 Elsevier Interactive Patient Education  [employer].

## 2017-11-19 NOTE — Progress Notes (Signed)
New OB Packet Given

## 2017-11-20 LAB — GC/CHLAMYDIA PROBE AMP (~~LOC~~) NOT AT ARMC
CHLAMYDIA, DNA PROBE: NEGATIVE
Neisseria Gonorrhea: NEGATIVE

## 2017-11-23 LAB — CULTURE, OB URINE

## 2017-11-23 LAB — URINE CULTURE, OB REFLEX

## 2017-11-26 LAB — OBSTETRIC PANEL, INCLUDING HIV
ANTIBODY SCREEN: NEGATIVE
BASOS: 0 %
Basophils Absolute: 0 10*3/uL (ref 0.0–0.2)
EOS (ABSOLUTE): 0.1 10*3/uL (ref 0.0–0.4)
Eos: 1 %
HEMATOCRIT: 36 % (ref 34.0–46.6)
HEP B S AG: NEGATIVE
HIV Screen 4th Generation wRfx: NONREACTIVE
Hemoglobin: 12.2 g/dL (ref 11.1–15.9)
IMMATURE GRANS (ABS): 0.1 10*3/uL (ref 0.0–0.1)
Immature Granulocytes: 1 %
LYMPHS: 17 %
Lymphocytes Absolute: 1.5 10*3/uL (ref 0.7–3.1)
MCH: 30.3 pg (ref 26.6–33.0)
MCHC: 33.9 g/dL (ref 31.5–35.7)
MCV: 90 fL (ref 79–97)
MONOCYTES: 5 %
Monocytes Absolute: 0.4 10*3/uL (ref 0.1–0.9)
Neutrophils Absolute: 6.6 10*3/uL (ref 1.4–7.0)
Neutrophils: 76 %
Platelets: 232 10*3/uL (ref 150–450)
RBC: 4.02 x10E6/uL (ref 3.77–5.28)
RDW: 14.4 % (ref 12.3–15.4)
RPR Ser Ql: NONREACTIVE
Rh Factor: POSITIVE
Rubella Antibodies, IGG: 1.88 index (ref >0.99)
WBC: 8.7 10*3/uL (ref 3.4–10.8)

## 2017-11-26 LAB — CYSTIC FIBROSIS GENE TEST

## 2017-11-26 LAB — SMN1 COPY NUMBER ANALYSIS (SMA CARRIER SCREENING)

## 2017-11-27 ENCOUNTER — Encounter: Payer: Self-pay | Admitting: *Deleted

## 2017-12-03 ENCOUNTER — Other Ambulatory Visit: Payer: Self-pay | Admitting: Certified Nurse Midwife

## 2017-12-05 ENCOUNTER — Telehealth: Payer: Self-pay | Admitting: General Practice

## 2017-12-05 NOTE — Telephone Encounter (Signed)
Patient called and left message on nurse voicemail line requesting refill on nausea medication.

## 2017-12-06 MED ORDER — PROCHLORPERAZINE MALEATE 10 MG PO TABS: 10.0000 mg | ORAL_TABLET | Freq: Four times a day (QID) | ORAL | 1 refills | Status: DC | PRN

## 2017-12-06 NOTE — Telephone Encounter (Signed)
Pt called requesting refill on her Compazine.  Medication e-prescribed per Luna KitchensKathryn Kooistra, CNM

## 2017-12-17 ENCOUNTER — Other Ambulatory Visit: Payer: Self-pay | Admitting: Student

## 2017-12-17 ENCOUNTER — Ambulatory Visit (INDEPENDENT_AMBULATORY_CARE_PROVIDER_SITE_OTHER): Payer: Self-pay | Admitting: Clinical

## 2017-12-17 ENCOUNTER — Ambulatory Visit (INDEPENDENT_AMBULATORY_CARE_PROVIDER_SITE_OTHER): Payer: Medicaid Other | Admitting: Student

## 2017-12-17 DIAGNOSIS — O0990 Supervision of high risk pregnancy, unspecified, unspecified trimester: Secondary | ICD-10-CM

## 2017-12-17 DIAGNOSIS — F4323 Adjustment disorder with mixed anxiety and depressed mood: Secondary | ICD-10-CM

## 2017-12-17 DIAGNOSIS — Z658 Other specified problems related to psychosocial circumstances: Secondary | ICD-10-CM

## 2017-12-17 MED ORDER — SCOPOLAMINE 1 MG/3DAYS TD PT72: 1.0000 | MEDICATED_PATCH | TRANSDERMAL | 1 refills | Status: DC

## 2017-12-17 MED ORDER — PRENATAL PLUS 27-1 MG PO TABS: 1.0000 | ORAL_TABLET | Freq: Every day | ORAL | 0 refills | Status: DC
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
## 2017-12-17 NOTE — Patient Instructions (Signed)
Alpha-Fetoprotein Test Why am I having this test? This test is used to screen for birth defects, such as chromosomal abnormalities, neural tube defects, and body wall defects. It can also be used as a tumor marker for certain cancers. Alpha-Fetoprotein (AFP) is a protein that is made by the fetal liver. Levels can be detected during pregnancy, starting at [redacted] weeks gestation and peaking at 16-[redacted] weeks gestation. Your health care provider may perform this test if you are pregnant or if a tumor is suspected. What kind of sample is taken? A blood sample is required for this test. It is usually collected by inserting a needle into a vein. How do I prepare for this test? There is no preparation or fasting required for this test. What are the reference ranges? References ranges are considered healthy ranges established after testing a large group of healthy people. Reference ranges may vary among different people, labs, and hospitals. It is your responsibility to obtain your test results. Ask the lab or department performing the test when and how you will get your results. Reference ranges for AFP are the following:  Adult: Less than 40 ng/mL or less than 40 mcg/L (SI units).  Child younger than 1 year: Less than 30 ng/mL.  Ranges are stratified by weeks of gestation, and they vary among laboratories. What do the results mean? Values above the reference ranges in pregnant women may indicate:  Neural tube defects.  Abdominal wall defects.  Multiple pregnancy.  Congenital abnormalities.  Fetal distress or fetal death.  Values above the reference ranges in nonpregnant women may indicate:  Reproductive cancers.  Liver cancer.  Liver cell death.  Other types of cancer.  Values below the reference ranges in pregnant women may indicate:  Down syndrome.  Fetal death.  Talk with your health care provider to discuss your results, treatment options, and if necessary, the need for more  tests. Talk with your health care provider if you have any questions about your results. Talk with your health care provider to discuss your results, treatment options, and if necessary, the need for more tests. Talk with your health care provider if you have any questions about your results. This information is not intended to replace advice given to you by your health care provider. Make sure you discuss any questions you have with your health care provider. Document Released: 05/24/2004 Document Revised: 01/02/2016 Document Reviewed: 10/02/2013 Elsevier Interactive Patient Education  2018 Elsevier Inc.  
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
## 2017-12-17 NOTE — Addendum Note (Signed)
Addended by: Hulda MarinMCMANNES, JAMIE C on: 12/17/2017 01:49 PM   Modules accepted: Level of Service

## 2017-12-19 ENCOUNTER — Other Ambulatory Visit: Payer: Self-pay | Admitting: Advanced Practice Midwife

## 2017-12-19 ENCOUNTER — Other Ambulatory Visit (HOSPITAL_COMMUNITY): Payer: Self-pay | Admitting: *Deleted

## 2017-12-19 ENCOUNTER — Ambulatory Visit (HOSPITAL_COMMUNITY)
Admission: RE | Admit: 2017-12-19 | Discharge: 2017-12-19 | Disposition: A | Discharge status: Home / Self Care | Payer: Medicaid Other | Source: Ambulatory Visit | Attending: Advanced Practice Midwife | Admitting: Advanced Practice Midwife

## 2017-12-19 DIAGNOSIS — Z3A18 18 weeks gestation of pregnancy: Secondary | ICD-10-CM

## 2017-12-19 DIAGNOSIS — Z363 Encounter for antenatal screening for malformations: Secondary | ICD-10-CM

## 2017-12-19 DIAGNOSIS — Z362 Encounter for other antenatal screening follow-up: Secondary | ICD-10-CM

## 2017-12-19 LAB — AFP, SERUM, OPEN SPINA BIFIDA
AFP MOM: 1.05
AFP VALUE AFPOSL: 46.8 ng/mL
Gest. Age on Collection Date: 17.5 weeks
Maternal Age At EDD: 29.6 yr
OSBR RISK 1 IN: 10000
Test Results:: NEGATIVE
WEIGHT: 144 [lb_av]

## 2017-12-26 ENCOUNTER — Ambulatory Visit (HOSPITAL_COMMUNITY): Payer: MEDICAID

## 2018-01-21 ENCOUNTER — Encounter: Payer: Self-pay | Admitting: Student

## 2018-01-21 ENCOUNTER — Ambulatory Visit (HOSPITAL_COMMUNITY): Payer: Medicaid Other

## 2018-01-29 ENCOUNTER — Encounter: Payer: Self-pay | Admitting: Student

## 2018-01-29 ENCOUNTER — Ambulatory Visit (HOSPITAL_COMMUNITY): Payer: Medicaid Other

## 2018-02-04 ENCOUNTER — Ambulatory Visit (INDEPENDENT_AMBULATORY_CARE_PROVIDER_SITE_OTHER): Payer: Medicaid Other | Admitting: Student

## 2018-02-04 ENCOUNTER — Ambulatory Visit (HOSPITAL_COMMUNITY)
Admission: RE | Admit: 2018-02-04 | Discharge: 2018-02-04 | Disposition: A | Discharge status: Home / Self Care | Payer: Medicaid Other | Source: Ambulatory Visit | Attending: Advanced Practice Midwife | Admitting: Advanced Practice Midwife

## 2018-02-04 DIAGNOSIS — Z362 Encounter for other antenatal screening follow-up: Secondary | ICD-10-CM | POA: Diagnosis not present

## 2018-02-04 DIAGNOSIS — D573 Sickle-cell trait: Secondary | ICD-10-CM

## 2018-02-04 DIAGNOSIS — Z3A25 25 weeks gestation of pregnancy: Secondary | ICD-10-CM | POA: Insufficient documentation

## 2018-02-04 DIAGNOSIS — O0990 Supervision of high risk pregnancy, unspecified, unspecified trimester: Secondary | ICD-10-CM

## 2018-02-04 MED ORDER — CYCLOBENZAPRINE HCL 10 MG PO TABS: 10.0000 mg | ORAL_TABLET | Freq: Three times a day (TID) | ORAL | 1 refills | Status: DC | PRN
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
## 2018-02-04 NOTE — Patient Instructions (Addendum)
-  come fasting to next appointment -Try flexeril for back pain

## 2018-02-04 NOTE — Progress Notes (Signed)
Per pt x1 week back/ shoulder pain across back to top of spine

## 2018-02-11 ENCOUNTER — Other Ambulatory Visit: Payer: Self-pay

## 2018-02-11 ENCOUNTER — Inpatient Hospital Stay (HOSPITAL_COMMUNITY)
Admission: AD | Admit: 2018-02-11 | Discharge: 2018-02-11 | Disposition: A | Discharge status: Home / Self Care | Payer: Medicaid Other | Source: Ambulatory Visit | Attending: Family Medicine | Admitting: Family Medicine

## 2018-02-11 ENCOUNTER — Encounter (HOSPITAL_COMMUNITY): Payer: Self-pay | Admitting: *Deleted

## 2018-02-11 DIAGNOSIS — R109 Unspecified abdominal pain: Secondary | ICD-10-CM | POA: Diagnosis not present

## 2018-02-11 DIAGNOSIS — R0602 Shortness of breath: Secondary | ICD-10-CM | POA: Insufficient documentation

## 2018-02-11 DIAGNOSIS — O26899 Other specified pregnancy related conditions, unspecified trimester: Secondary | ICD-10-CM

## 2018-02-11 DIAGNOSIS — Z87891 Personal history of nicotine dependence: Secondary | ICD-10-CM | POA: Diagnosis not present

## 2018-02-11 DIAGNOSIS — Z3A25 25 weeks gestation of pregnancy: Secondary | ICD-10-CM

## 2018-02-11 DIAGNOSIS — O26892 Other specified pregnancy related conditions, second trimester: Secondary | ICD-10-CM | POA: Insufficient documentation

## 2018-02-11 LAB — URINALYSIS, ROUTINE W REFLEX MICROSCOPIC
Bilirubin Urine: NEGATIVE
GLUCOSE, UA: 50 mg/dL — AB
HGB URINE DIPSTICK: NEGATIVE
Ketones, ur: NEGATIVE mg/dL
NITRITE: NEGATIVE
PH: 6 (ref 5.0–8.0)
Protein, ur: NEGATIVE mg/dL
SPECIFIC GRAVITY, URINE: 1.016 (ref 1.005–1.030)

## 2018-02-11 MED ORDER — IPRATROPIUM-ALBUTEROL 0.5-2.5 (3) MG/3ML IN SOLN
3.0000 mL | Freq: Four times a day (QID) | RESPIRATORY_TRACT | Status: DC
  Administered 2018-02-11: 3 mL via RESPIRATORY_TRACT
  Filled 2018-02-11: qty 3

## 2018-02-11 NOTE — MAU Provider Note (Signed)
History     CSN: [id number]  Arrival date and time: 02/11/18 1304   First Provider Initiated Contact with Patient 02/11/18 1406      Chief Complaint  Patient presents with  . Abdominal Pain  . Back Pain  . Shortness of Breath   G3P1011 @25 .5 wks here with SOB and racing heart rate. Sx started this morning. Audrey Pearson denies chest tightness or CP. Audrey Pearson reports a non-productive cough since yesterday. Her brother has a respiratory virus. Audrey Pearson has hx of asthma and used her inhaler but it didn't help. Reports good FM. No VB, LOF, or ctx.  OB History    Gravida  3   Para  1   Term  1   Preterm  0   AB  1   Living  1     SAB  1   TAB  0   Ectopic  0   Multiple  0   Live Births  1           Past Medical History:  Diagnosis Date  . Anxiety   . Asthma   . Depression   . IBS (irritable bowel syndrome)     Past Surgical History:  Procedure Laterality Date  . NO PAST SURGERIES      Family History  Adopted: Yes  Problem Relation Age of Onset  . Breast cancer Mother   . Alcohol abuse Mother   . Drug abuse Mother   . Alcohol abuse Father   . Drug abuse Father   . Sickle cell anemia Sister   . Drug abuse Brother     Social History   Tobacco Use  . Smoking status: Former Smoker    Types: Cigarettes    Last attempt to quit: 2019    Years since quitting: 0.7  . Smokeless tobacco: Never Used  Substance Use Topics  . Alcohol use: No    Comment: occ  . Drug use: No    Allergies: No Known Allergies  No medications prior to admission.    Review of Systems  Constitutional: Negative for chills and fever.  HENT: Negative for congestion and sore throat.   Respiratory: Positive for cough and shortness of breath. Negative for chest tightness and wheezing.   Cardiovascular: Negative for chest pain.  Gastrointestinal: Negative for abdominal pain.  Genitourinary: Negative for vaginal bleeding.   Physical Exam   Blood pressure 120/64, pulse 99, temperature  99.1 F (37.3 C), temperature source Oral, resp. rate 16, weight 73.3 kg, last menstrual period 08/12/2017, SpO2 100 %, not currently breastfeeding.  Physical Exam  Nursing note and vitals reviewed. Constitutional: Audrey Pearson is oriented to person, place, and time. Audrey Pearson appears well-developed and well-nourished. No distress.  HENT:  Head: Normocephalic and atraumatic.  Mouth/Throat: Uvula is midline, oropharynx is clear and moist and mucous membranes are normal.  Neck: Normal range of motion. Neck supple.  Cardiovascular: Normal rate, regular rhythm and normal heart sounds.  Mild tachy  Respiratory: Effort normal. No respiratory distress. Audrey Pearson has decreased breath sounds in the right lower field. Audrey Pearson has no wheezes. Audrey Pearson has no rales.  Musculoskeletal: Normal range of motion.  Lymphadenopathy:    Audrey Pearson has no cervical adenopathy.  Neurological: Audrey Pearson is alert and oriented to person, place, and time.  Skin: Skin is warm and dry.  Psychiatric: Audrey Pearson has a normal mood and affect.  EFM: 150 bpm, mod variability, + accels, no decels Toco: none  Results for orders placed or performed during the hospital encounter  of 02/11/18 (from the past 24 hour(s))  Urinalysis, Routine w reflex microscopic     Status: Abnormal   Collection Time: 02/11/18  1:54 PM  Result Value Ref Range   Color, Urine YELLOW YELLOW   APPearance CLEAR CLEAR   Specific Gravity, Urine 1.016 1.005 - 1.030   pH 6.0 5.0 - 8.0   Glucose, UA 50 (A) NEGATIVE mg/dL   Hgb urine dipstick NEGATIVE NEGATIVE   Bilirubin Urine NEGATIVE NEGATIVE   Ketones, ur NEGATIVE NEGATIVE mg/dL   Protein, ur NEGATIVE NEGATIVE mg/dL   Nitrite NEGATIVE NEGATIVE   Leukocytes, UA TRACE (A) NEGATIVE   RBC / HPF 0-5 0 - 5 RBC/hpf   WBC, UA 0-5 0 - 5 WBC/hpf   Bacteria, UA RARE (A) NONE SEEN   Squamous Epithelial / LPF 0-5 0 - 5   MAU Course  Procedures  MDM Labs ordered and reviewed. Duoneb ordered. Pt reports little relief after neb. No respiratory  distress, normal o2 sats, no evidence of PE. Likely early stage onset of resp virus causing asthma exacerbation. Recommend regular inhaler use at home. Return for worsening sx. Stable for discharge home.  Assessment and Plan   1. [redacted] weeks gestation of pregnancy   2. Shortness of breath due to pregnancy    Discharge home Follow up in OB office this week as scheduled Albuterol inhaler q6 hrs (has at home)  Allergies as of 02/11/2018   No Known Allergies     Medication List    TAKE these medications   acetaminophen 500 MG tablet Commonly known as:  TYLENOL Take 1,000 mg by mouth every 6 (six) hours as needed for headache.   albuterol 108 (90 Base) MCG/ACT inhaler Commonly known as:  PROVENTIL HFA;VENTOLIN HFA Inhale 2 puffs into the lungs every 4 (four) hours as needed for wheezing or shortness of breath.   cyclobenzaprine 10 MG tablet Commonly known as:  FLEXERIL Take 1 tablet (10 mg total) by mouth every 8 (eight) hours as needed for muscle spasms.   prenatal vitamin w/FE, FA 27-1 MG Tabs tablet Take 1 tablet by mouth daily at 12 noon.   prochlorperazine 10 MG tablet Commonly known as:  COMPAZINE Take 1 tablet (10 mg total) by mouth every 6 (six) hours as needed for nausea or vomiting.   scopolamine 1 MG/3DAYS Commonly known as:  TRANSDERM-SCOP Place 1 patch (1.5 mg total) onto the skin every 3 (three) days.       Donette Larry, CNM 02/11/2018, 5:19 PM

## 2018-02-11 NOTE — MAU Note (Signed)
Woke up feeling weird this morning, felt short of breath, and heart was pounding.  Used inhaler, didn't seem to help.  Having some sharp pains in lower abd,wraps around to back.

## 2018-02-11 NOTE — Discharge Instructions (Signed)
Upper Respiratory Infection, Adult Most upper respiratory infections (URIs) are caused by a virus. A URI affects the nose, throat, and upper air passages. The most common type of URI is often called "the common cold." Follow these instructions at home:  Take medicines only as told by your doctor.  Gargle warm saltwater or take cough drops to comfort your throat as told by your doctor.  Use a warm mist humidifier or inhale steam from a shower to increase air moisture. This may make it easier to breathe.  Drink enough fluid to keep your pee (urine) clear or pale yellow.  Eat soups and other clear broths.  Have a healthy diet.  Rest as needed.  Go back to work when your fever is gone or your doctor says it is okay. ? You may need to stay home longer to avoid giving your URI to others. ? You can also wear a face mask and wash your hands often to prevent spread of the virus.  Use your inhaler more if you have asthma.  Do not use any tobacco products, including cigarettes, chewing tobacco, or electronic cigarettes. If you need help quitting, ask your doctor. Contact a doctor if:  You are getting worse, not better.  Your symptoms are not helped by medicine.  You have chills.  You are getting more short of breath.  You have brown or red mucus.  You have yellow or brown discharge from your nose.  You have pain in your face, especially when you bend forward.  You have a fever.  You have puffy (swollen) neck glands.  You have pain while swallowing.  You have white areas in the back of your throat. Get help right away if:  You have very bad or constant: ? Headache. ? Ear pain. ? Pain in your forehead, behind your eyes, and over your cheekbones (sinus pain). ? Chest pain.  You have long-lasting (chronic) lung disease and any of the following: ? Wheezing. ? Long-lasting cough. ? Coughing up blood. ? A change in your usual mucus.  You have a stiff neck.  You have  changes in your: ? Vision. ? Hearing. ? Thinking. ? Mood. This information is not intended to replace advice given to you by your health care provider. Make sure you discuss any questions you have with your health care provider. Document Released: 10/17/2007 Document Revised: 01/01/2016 Document Reviewed: 08/05/2013 Elsevier Interactive Patient Education  2018 [employer].   Asthma, Adult Asthma is a condition of the lungs in which the airways tighten and narrow. Asthma can make it hard to breathe. Asthma cannot be cured, but medicine and lifestyle changes can help control it. Asthma may be started (triggered) by:  Animal skin flakes (dander).  Dust.  Cockroaches.  Pollen.  Mold.  Smoke.  Cleaning products.  Hair sprays or aerosol sprays.  Paint fumes or strong smells.  Cold air, weather changes, and winds.  Crying or laughing hard.  Stress.  Certain medicines or drugs.  Foods, such as dried fruit, potato chips, and sparkling grape juice.  Infections or conditions (colds, flu).  Exercise.  Certain medical conditions or diseases.  Exercise or tiring activities.  Follow these instructions at home:  Take medicine as told by your doctor.  Use a peak flow meter as told by your doctor. A peak flow meter is a tool that measures how well the lungs are working.  Record and keep track of the peak flow meter's readings.  Understand and use the asthma  action plan. An asthma action plan is a written plan for taking care of your asthma and treating your attacks.  To help prevent asthma attacks: ? Do not smoke. Stay away from secondhand smoke. ? Change your heating and air conditioning filter often. ? Limit your use of fireplaces and wood stoves. ? Get rid of pests (such as roaches and mice) and their droppings. ? Throw away plants if you see mold on them. ? Clean your floors. Dust regularly. Use cleaning products that do not smell. ? Have someone vacuum when you  are not home. Use a vacuum cleaner with a HEPA filter if possible. ? Replace carpet with wood, tile, or vinyl flooring. Carpet can trap animal skin flakes and dust. ? Use allergy-proof pillows, mattress covers, and box spring covers. ? Wash bed sheets and blankets every week in hot water and dry them in a dryer. ? Use blankets that are made of polyester or cotton. ? Clean bathrooms and kitchens with bleach. If possible, have someone repaint the walls in these rooms with mold-resistant paint. Keep out of the rooms that are being cleaned and painted. ? Wash hands often. Contact a doctor if:  You have make a whistling sound when breaking (wheeze), have shortness of breath, or have a cough even if taking medicine to prevent attacks.  The colored mucus you cough up (sputum) is thicker than usual.  The colored mucus you cough up changes from clear or white to yellow, green, gray, or bloody.  You have problems from the medicine you are taking such as: ? A rash. ? Itching. ? Swelling. ? Trouble breathing.  You need reliever medicines more than 2-3 times a week.  Your peak flow measurement is still at 50-79% of your personal best after following the action plan for 1 hour.  You have a fever. Get help right away if:  You seem to be worse and are not responding to medicine during an asthma attack.  You are short of breath even at rest.  You get short of breath when doing very little activity.  You have trouble eating, drinking, or talking.  You have chest pain.  You have a fast heartbeat.  Your lips or fingernails start to turn blue.  You are light-headed, dizzy, or faint.  Your peak flow is less than 50% of your personal best. This information is not intended to replace advice given to you by your health care provider. Make sure you discuss any questions you have with your health care provider. Document Released: 10/17/2007 Document Revised: 10/06/2015 Document Reviewed:  11/27/2012 Elsevier Interactive Patient Education  2017 [employer].

## 2018-02-27 ENCOUNTER — Encounter (HOSPITAL_COMMUNITY): Payer: Self-pay

## 2018-02-27 ENCOUNTER — Inpatient Hospital Stay (HOSPITAL_COMMUNITY)
Admission: AD | Admit: 2018-02-27 | Discharge: 2018-02-27 | Disposition: A | Discharge status: Home / Self Care | Payer: Medicaid Other | Source: Ambulatory Visit | Attending: Family Medicine | Admitting: Family Medicine

## 2018-02-27 DIAGNOSIS — K5900 Constipation, unspecified: Secondary | ICD-10-CM

## 2018-02-27 DIAGNOSIS — Z87891 Personal history of nicotine dependence: Secondary | ICD-10-CM | POA: Insufficient documentation

## 2018-02-27 DIAGNOSIS — Z3A28 28 weeks gestation of pregnancy: Secondary | ICD-10-CM | POA: Diagnosis not present

## 2018-02-27 DIAGNOSIS — O99613 Diseases of the digestive system complicating pregnancy, third trimester: Secondary | ICD-10-CM

## 2018-02-27 DIAGNOSIS — O0990 Supervision of high risk pregnancy, unspecified, unspecified trimester: Secondary | ICD-10-CM

## 2018-02-27 DIAGNOSIS — O26893 Other specified pregnancy related conditions, third trimester: Secondary | ICD-10-CM | POA: Insufficient documentation

## 2018-02-27 DIAGNOSIS — R109 Unspecified abdominal pain: Secondary | ICD-10-CM | POA: Diagnosis not present

## 2018-02-27 HISTORY — DX: Other constipation: K59.09

## 2018-02-27 LAB — URINALYSIS, ROUTINE W REFLEX MICROSCOPIC
Bilirubin Urine: NEGATIVE
Glucose, UA: NEGATIVE mg/dL
Hgb urine dipstick: NEGATIVE
Ketones, ur: NEGATIVE mg/dL
Nitrite: NEGATIVE
Protein, ur: NEGATIVE mg/dL
Specific Gravity, Urine: 1.011 (ref 1.005–1.030)
pH: 8 (ref 5.0–8.0)

## 2018-02-27 MED ORDER — BISACODYL 10 MG RE SUPP: 10.0000 mg | Freq: Every day | RECTAL | 2 refills | Status: DC | PRN

## 2018-02-27 MED ORDER — POLYETHYLENE GLYCOL 3350 17 G PO PACK: 17.0000 g | PACK | Freq: Two times a day (BID) | ORAL | 0 refills | Status: DC

## 2018-02-27 NOTE — MAU Note (Addendum)
Pt arrived EMS with right sided abdominal pain that shoots into her pelvis. Pain is 8/10. Was here a couple of weeks ago with URI. Been around people with + Flu at work. + FM, no bleeding. Also states she has a feeling like she has to have a BM, but has IBS with chronic constipation, so it's hard for her to go.

## 2018-02-27 NOTE — Discharge Instructions (Signed)

## 2018-02-27 NOTE — MAU Provider Note (Addendum)
History     CSN: [id number]  Arrival date and time: 02/27/18 1300   First Provider Initiated Contact with Patient 02/27/18 1318      Chief Complaint  Patient presents with  . Abdominal Pain   HPI 27 year old G3 P1-0-1-1 at 28 weeks who presents with abdominal pain that is mostly right-sided, radiation into the pelvis.  Pregnancy is complicated by IBS with chronic constipation.  Patient states that she has not had a stool in more than a week.  Has decreased appetite.  She has been using MiraLAX without improvement.  Denies decreased fetal movement, contractions, vaginal bleeding, and vaginal discharge.  OB History    Gravida  3   Para  1   Term  1   Preterm  0   AB  1   Living  1     SAB  1   TAB  0   Ectopic  0   Multiple  0   Live Births  1           Past Medical History:  Diagnosis Date  . Anxiety   . Asthma   . Chronic constipation   . Depression   . IBS (irritable bowel syndrome)     Past Surgical History:  Procedure Laterality Date  . NO PAST SURGERIES      Family History  Adopted: Yes  Problem Relation Age of Onset  . Breast cancer Mother   . Alcohol abuse Mother   . Drug abuse Mother   . Alcohol abuse Father   . Drug abuse Father   . Sickle cell anemia Sister   . Drug abuse Brother     Social History   Tobacco Use  . Smoking status: Former Smoker    Types: Cigarettes    Last attempt to quit: 2019    Years since quitting: 0.7  . Smokeless tobacco: Never Used  Substance Use Topics  . Alcohol use: No    Comment: occ  . Drug use: No    Allergies: No Known Allergies  Medications Prior to Admission  Medication Sig Dispense Refill Last Dose  . acetaminophen (TYLENOL) 500 MG tablet Take 1,000 mg by mouth every 6 (six) hours as needed for headache.   Past Week at Unknown time  . albuterol (PROVENTIL HFA;VENTOLIN HFA) 108 (90 Base) MCG/ACT inhaler Inhale 2 puffs into the lungs every 4 (four) hours as needed for wheezing or  shortness of breath.   02/11/2018 at Unknown time  . cyclobenzaprine (FLEXERIL) 10 MG tablet Take 1 tablet (10 mg total) by mouth every 8 (eight) hours as needed for muscle spasms. 30 tablet 1 Past Week at Unknown time  . prenatal vitamin w/FE, FA (PRENATAL 1 + 1) 27-1 MG TABS tablet Take 1 tablet by mouth daily at 12 noon. (Patient not taking: Reported on 02/04/2018) 30 each 0 Not Taking at Unknown time  . prochlorperazine (COMPAZINE) 10 MG tablet Take 1 tablet (10 mg total) by mouth every 6 (six) hours as needed for nausea or vomiting. 30 tablet 1 Past Week at Unknown time  . scopolamine (TRANSDERM-SCOP) 1 MG/3DAYS Place 1 patch (1.5 mg total) onto the skin every 3 (three) days. 10 patch 1 Past Week at Unknown time    Review of Systems  All other systems reviewed and are negative.  Physical Exam   Blood pressure 118/72, pulse 99, temperature 99.1 F (37.3 C), temperature source Oral, resp. rate 18, last menstrual period 08/12/2017, SpO2 99 %, not currently breastfeeding.  Physical Exam  Constitutional: She is oriented to person, place, and time. She appears well-developed and well-nourished.  HENT:  Head: Normocephalic and atraumatic.  Right Ear: External ear normal.  Left Ear: External ear normal.  Eyes: Pupils are equal, round, and reactive to light. Conjunctivae are normal.  Neck: Normal range of motion. Neck supple.  Cardiovascular: Normal rate, regular rhythm and normal heart sounds.  Respiratory: Effort normal.  GI: Soft. She exhibits distension. There is tenderness (diffuse tenderness). There is no rebound and no guarding.  Moderate stool burden on exam  Neurological: She is alert and oriented to person, place, and time.  Skin: Skin is warm and dry.  Psychiatric: She has a normal mood and affect. Her behavior is normal. Judgment and thought content normal.    MAU Course  Procedures NST FHT 130s, moderate variability, no decel. 10x10. MDM  Assessment and Plan      ICD-10-CM   1. Constipation in pregnancy in third trimester O99.613    K59.00   2. Supervision of high risk pregnancy, antepartum O09.90    Offered enema to the patient, which she declined.  Offered to give the patient medication here for her constipation.  This was also declined.  D/c to home.  Continue MiraLAX twice a day.  Add bisacodyl suppositories.  Patient instructed to call office tomorrow morning if she still does not have a bowel movement.  Levie Heritage 02/27/2018, 1:21 PM

## 2018-03-03 ENCOUNTER — Other Ambulatory Visit: Payer: Self-pay | Admitting: *Deleted

## 2018-03-03 DIAGNOSIS — O0990 Supervision of high risk pregnancy, unspecified, unspecified trimester: Secondary | ICD-10-CM

## 2018-03-04 ENCOUNTER — Other Ambulatory Visit: Payer: Medicaid Other

## 2018-03-04 ENCOUNTER — Ambulatory Visit (INDEPENDENT_AMBULATORY_CARE_PROVIDER_SITE_OTHER): Payer: Medicaid Other | Admitting: Student

## 2018-03-04 DIAGNOSIS — Z23 Encounter for immunization: Secondary | ICD-10-CM

## 2018-03-04 DIAGNOSIS — O0990 Supervision of high risk pregnancy, unspecified, unspecified trimester: Secondary | ICD-10-CM

## 2018-03-04 DIAGNOSIS — O0993 Supervision of high risk pregnancy, unspecified, third trimester: Secondary | ICD-10-CM

## 2018-03-04 DIAGNOSIS — D573 Sickle-cell trait: Secondary | ICD-10-CM | POA: Diagnosis not present

## 2018-03-04 MED ORDER — PSYLLIUM 28 % PO PACK: 1.0000 | PACK | Freq: Two times a day (BID) | ORAL | 1 refills | Status: AC
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
## 2018-03-04 NOTE — Progress Notes (Signed)
Pt states is not taking Prenatal vitamins because it messes with her IBS & makes her constipated.

## 2018-03-04 NOTE — Patient Instructions (Signed)
Characteristics and sources of common FODMAPs    Word that corresponds to letter in acronym Compounds in this category Foods that contain these compounds   F Fermentable   O Oligosaccharides Fructans, galacto-oligosaccharides Wheat, barley, rye, onion, leek, white part of spring onion, garlic, shallots, artichokes, beetroot, fennel, peas, chicory, pistachio, cashews, legumes, lentils, and chickpeas   D Disaccharides Lactose Milk, custard, ice cream, and yogurt   M Monosaccharides "Free fructose" (fructose in excess of glucose) Apples, pears, mangoes, cherries, watermelon, asparagus, sugar snap peas, honey, high-fructose corn syrup   A And   P Polyols Sorbitol, mannitol, maltitol, and xylitol Apples, pears, apricots, cherries, nectarines, peaches, plums, watermelon, mushrooms, cauliflower, artificially sweetened chewing gum and confectionery

## 2018-03-04 NOTE — Progress Notes (Signed)
Pt states has been having a lot of sharpe pains in abdomen. Pt states this has been going on since last week & stomach is very tender.

## 2018-03-05 ENCOUNTER — Encounter: Payer: Self-pay | Admitting: Student

## 2018-03-05 ENCOUNTER — Telehealth: Payer: Self-pay | Admitting: General Practice

## 2018-03-05 DIAGNOSIS — O24410 Gestational diabetes mellitus in pregnancy, diet controlled: Secondary | ICD-10-CM

## 2018-03-05 DIAGNOSIS — O24419 Gestational diabetes mellitus in pregnancy, unspecified control: Secondary | ICD-10-CM | POA: Insufficient documentation

## 2018-03-05 LAB — CBC
HEMATOCRIT: 32.2 % — AB (ref 34.0–46.6)
Hemoglobin: 10.5 g/dL — ABNORMAL LOW (ref 11.1–15.9)
MCH: 28.8 pg (ref 26.6–33.0)
MCHC: 32.6 g/dL (ref 31.5–35.7)
MCV: 89 fL (ref 79–97)
Platelets: 225 10*3/uL (ref 150–450)
RBC: 3.64 x10E6/uL — AB (ref 3.77–5.28)
RDW: 12.9 % (ref 12.3–15.4)
WBC: 14 10*3/uL — AB (ref 3.4–10.8)

## 2018-03-05 LAB — GLUCOSE TOLERANCE, 2 HOURS W/ 1HR
GLUCOSE, 2 HOUR: 79 mg/dL (ref 65–152)
Glucose, 1 hour: 126 mg/dL (ref 65–179)
Glucose, Fasting: 94 mg/dL — ABNORMAL HIGH (ref 65–91)

## 2018-03-05 LAB — RPR: RPR Ser Ql: NONREACTIVE

## 2018-03-05 LAB — HIV ANTIBODY (ROUTINE TESTING W REFLEX): HIV Screen 4th Generation wRfx: NONREACTIVE

## 2018-03-05 MED ORDER — ACCU-CHEK FASTCLIX LANCETS MISC: 1.0000 | Freq: Four times a day (QID) | 12 refills | Status: DC

## 2018-03-05 MED ORDER — ACCU-CHEK GUIDE W/DEVICE KIT: 1.0000 | PACK | Freq: Once | 0 refills | Status: AC

## 2018-03-05 MED ORDER — GLUCOSE BLOOD VI STRP: ORAL_STRIP | 12 refills | Status: DC

## 2018-03-05 NOTE — Telephone Encounter (Signed)
Called & informed patient of results, testing supplies sent to pharmacy & offered appt 10/29. Patient verbalized understanding to all & agrees to 10/29 @ 10am appt. Patient had no questions.

## 2018-03-05 NOTE — Telephone Encounter (Signed)
-----   Message from Marylene Land, CNM sent at 03/05/2018  9:00 AM EDT ----- This patient needs lancets, test strips and to be set up with diabetic education. Thank you so much!

## 2018-03-06 LAB — URINE CULTURE, OB REFLEX: Organism ID, Bacteria: NO GROWTH

## 2018-03-06 LAB — CULTURE, OB URINE

## 2018-03-09 ENCOUNTER — Other Ambulatory Visit: Payer: Self-pay | Admitting: Student

## 2018-03-09 MED ORDER — POLYETHYLENE GLYCOL 3350 17 G PO PACK: 17.0000 g | PACK | Freq: Two times a day (BID) | ORAL | 10 refills | Status: DC

## 2018-03-11 ENCOUNTER — Encounter: Payer: Medicaid Other | Attending: Obstetrics and Gynecology | Admitting: *Deleted

## 2018-03-11 ENCOUNTER — Ambulatory Visit: Payer: Medicaid Other | Admitting: *Deleted

## 2018-03-11 DIAGNOSIS — O24410 Gestational diabetes mellitus in pregnancy, diet controlled: Secondary | ICD-10-CM | POA: Insufficient documentation

## 2018-03-11 NOTE — Progress Notes (Signed)
  Patient was seen on 03/11/2018 for Gestational Diabetes self-management. EDD 05/22/2018. Patient states she forgot she had a banana in the middle of the night before her GTT and did not realize until she got home that day. Her FBG was elevated @ 94 mg/dl, but none of her post Glucola tests were high, so I asked her to mention that to her MD at her next visit.  Patient states no history of GDM. Diet history obtained. She states she is living with her mother and that things are difficult there, She prepares breakfast in AM for herself and her daughter, and eats fast food or at work for lunch and occasionally eats dinner. Patient eats fair variety of all food groups. Beverages include water, juice and gatorade.  The following learning objectives were met by the patient :   States the definition of Gestational Diabetes  States why dietary management is important in controlling blood glucose  Describes the effects of carbohydrates on blood glucose levels  Demonstrates ability to create a balanced meal plan  Demonstrates carbohydrate counting   States when to check blood glucose levels  Demonstrates proper blood glucose monitoring techniques  States the effect of stress and exercise on blood glucose levels  States the importance of limiting caffeine and abstaining from alcohol and smoking  Plan:  Aim for 3 Carb Choices per meal (45 grams) +/- 1 either way  Aim for 1-2 Carbs per snack Begin reading food labels for Total Carbohydrate of foods If OK with your MD, consider  increasing your activity level by walking, Arm Chair Exercises or other activity daily as tolerated Begin checking BG before breakfast and 2 hours after first bite of breakfast, lunch and dinner as directed by MD  Bring Log Book/Sheet to every medical appointment   Baby Scripts:  Patient was introduced to [employer] and plans to use as record of  BG electronically  Take medication if directed by MD  Patient already has a  meter: Accu Chek Guide that she just picked up from pharmacy, I showed her how to use it today. Patient instructed to test pre breakfast and 2 hours each meal as directed by MD  Patient instructed to monitor glucose levels: FBS: 60 - 95 mg/dl 2 hour: <120 mg/dl  Patient received the following handouts:  Nutrition Diabetes and Pregnancy  Carbohydrate Counting List  Patient will be seen for follow-up as needed.

## 2018-03-18 ENCOUNTER — Ambulatory Visit (INDEPENDENT_AMBULATORY_CARE_PROVIDER_SITE_OTHER): Payer: Medicaid Other | Admitting: Family Medicine

## 2018-03-18 VITALS — BP 128/70 | HR 116 | Wt 164.9 lb

## 2018-03-18 DIAGNOSIS — O24419 Gestational diabetes mellitus in pregnancy, unspecified control: Secondary | ICD-10-CM

## 2018-03-18 DIAGNOSIS — O0993 Supervision of high risk pregnancy, unspecified, third trimester: Secondary | ICD-10-CM

## 2018-03-18 LAB — POCT URINALYSIS DIP (DEVICE)
BILIRUBIN URINE: NEGATIVE
Glucose, UA: NEGATIVE mg/dL
Hgb urine dipstick: NEGATIVE
Ketones, ur: NEGATIVE mg/dL
NITRITE: NEGATIVE
Protein, ur: NEGATIVE mg/dL
Specific Gravity, Urine: 1.015 (ref 1.005–1.030)
Urobilinogen, UA: 0.2 mg/dL (ref 0.0–1.0)
pH: 5.5 (ref 5.0–8.0)

## 2018-03-18 NOTE — Patient Instructions (Signed)
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
## 2018-04-02 ENCOUNTER — Ambulatory Visit (INDEPENDENT_AMBULATORY_CARE_PROVIDER_SITE_OTHER): Payer: Medicaid Other | Admitting: Family Medicine

## 2018-04-02 ENCOUNTER — Ambulatory Visit: Payer: Self-pay

## 2018-04-02 ENCOUNTER — Ambulatory Visit (INDEPENDENT_AMBULATORY_CARE_PROVIDER_SITE_OTHER): Payer: Medicaid Other | Admitting: *Deleted

## 2018-04-02 VITALS — BP 120/72 | HR 101 | Wt 165.6 lb

## 2018-04-02 DIAGNOSIS — O0990 Supervision of high risk pregnancy, unspecified, unspecified trimester: Secondary | ICD-10-CM

## 2018-04-02 DIAGNOSIS — O24410 Gestational diabetes mellitus in pregnancy, diet controlled: Secondary | ICD-10-CM

## 2018-04-02 DIAGNOSIS — Z3A32 32 weeks gestation of pregnancy: Secondary | ICD-10-CM

## 2018-04-02 DIAGNOSIS — O0993 Supervision of high risk pregnancy, unspecified, third trimester: Secondary | ICD-10-CM

## 2018-04-02 DIAGNOSIS — O24419 Gestational diabetes mellitus in pregnancy, unspecified control: Secondary | ICD-10-CM

## 2018-04-02 NOTE — Patient Instructions (Signed)
 Breastfeeding Choosing to breastfeed is one of the best decisions you can make for yourself and your baby. A change in hormones during pregnancy causes your breasts to make breast milk in your milk-producing glands. Hormones prevent breast milk from being released before your baby is born. They also prompt milk flow after birth. Once breastfeeding has begun, thoughts of your baby, as well as his or her sucking or crying, can stimulate the release of milk from your milk-producing glands. Benefits of breastfeeding Research shows that breastfeeding offers many health benefits for infants and mothers. It also offers a cost-free and convenient way to feed your baby. For your baby  Your first milk (colostrum) helps your baby's digestive system to function better.  Special cells in your milk (antibodies) help your baby to fight off infections.  Breastfed babies are less likely to develop asthma, allergies, obesity, or type 2 diabetes. They are also at lower risk for sudden infant death syndrome (SIDS).  Nutrients in breast milk are better able to meet your baby's needs compared to infant formula.  Breast milk improves your baby's brain development. For you  Breastfeeding helps to create a very special bond between you and your baby.  Breastfeeding is convenient. Breast milk costs nothing and is always available at the correct temperature.  Breastfeeding helps to burn calories. It helps you to lose the weight that you gained during pregnancy.  Breastfeeding makes your uterus return faster to its size before pregnancy. It also slows bleeding (lochia) after you give birth.  Breastfeeding helps to lower your risk of developing type 2 diabetes, osteoporosis, rheumatoid arthritis, cardiovascular disease, and breast, ovarian, uterine, and endometrial cancer later in life. Breastfeeding basics Starting breastfeeding  Find a comfortable place to sit or lie down, with your neck and back  well-supported.  Place a pillow or a rolled-up blanket under your baby to bring him or her to the level of your breast (if you are seated). Nursing pillows are specially designed to help support your arms and your baby while you breastfeed.  Make sure that your baby's tummy (abdomen) is facing your abdomen.  Gently massage your breast. With your fingertips, massage from the outer edges of your breast inward toward the nipple. This encourages milk flow. If your milk flows slowly, you may need to continue this action during the feeding.  Support your breast with 4 fingers underneath and your thumb above your nipple (make the letter "C" with your hand). Make sure your fingers are well away from your nipple and your baby's mouth.  Stroke your baby's lips gently with your finger or nipple.  When your baby's mouth is open wide enough, quickly bring your baby to your breast, placing your entire nipple and as much of the areola as possible into your baby's mouth. The areola is the colored area around your nipple. ? More areola should be visible above your baby's upper lip than below the lower lip. ? Your baby's lips should be opened and extended outward (flanged) to ensure an adequate, comfortable latch. ? Your baby's tongue should be between his or her lower gum and your breast.  Make sure that your baby's mouth is correctly positioned around your nipple (latched). Your baby's lips should create a seal on your breast and be turned out (everted).  It is common for your baby to suck about 2-3 minutes in order to start the flow of breast milk. Latching Teaching your baby how to latch onto your breast properly is   very important. An improper latch can cause nipple pain, decreased milk supply, and poor weight gain in your baby. Also, if your baby is not latched onto your nipple properly, he or she may swallow some air during feeding. This can make your baby fussy. Burping your baby when you switch breasts  during the feeding can help to get rid of the air. However, teaching your baby to latch on properly is still the best way to prevent fussiness from swallowing air while breastfeeding. Signs that your baby has successfully latched onto your nipple  Silent tugging or silent sucking, without causing you pain. Infant's lips should be extended outward (flanged).  Swallowing heard between every 3-4 sucks once your milk has started to flow (after your let-down milk reflex occurs).  Muscle movement above and in front of his or her ears while sucking.  Signs that your baby has not successfully latched onto your nipple  Sucking sounds or smacking sounds from your baby while breastfeeding.  Nipple pain.  If you think your baby has not latched on correctly, slip your finger into the corner of your baby's mouth to break the suction and place it between your baby's gums. Attempt to start breastfeeding again. Signs of successful breastfeeding Signs from your baby  Your baby will gradually decrease the number of sucks or will completely stop sucking.  Your baby will fall asleep.  Your baby's body will relax.  Your baby will retain a small amount of milk in his or her mouth.  Your baby will let go of your breast by himself or herself.  Signs from you  Breasts that have increased in firmness, weight, and size 1-3 hours after feeding.  Breasts that are softer immediately after breastfeeding.  Increased milk volume, as well as a change in milk consistency and color by the fifth day of breastfeeding.  Nipples that are not sore, cracked, or bleeding.  Signs that your baby is getting enough milk  Wetting at least 1-2 diapers during the first 24 hours after birth.  Wetting at least 5-6 diapers every 24 hours for the first week after birth. The urine should be clear or pale yellow by the age of 5 days.  Wetting 6-8 diapers every 24 hours as your baby continues to grow and develop.  At least 3  stools in a 24-hour period by the age of 5 days. The stool should be soft and yellow.  At least 3 stools in a 24-hour period by the age of 7 days. The stool should be seedy and yellow.  No loss of weight greater than 10% of birth weight during the first 3 days of life.  Average weight gain of 4-7 oz (113-198 g) per week after the age of 4 days.  Consistent daily weight gain by the age of 5 days, without weight loss after the age of 2 weeks. After a feeding, your baby may spit up a small amount of milk. This is normal. Breastfeeding frequency and duration Frequent feeding will help you make more milk and can prevent sore nipples and extremely full breasts (breast engorgement). Breastfeed when you feel the need to reduce the fullness of your breasts or when your baby shows signs of hunger. This is called "breastfeeding on demand." Signs that your baby is hungry include:  Increased alertness, activity, or restlessness.  Movement of the head from side to side.  Opening of the mouth when the corner of the mouth or cheek is stroked (rooting).  Increased sucking   sounds, smacking lips, cooing, sighing, or squeaking.  Hand-to-mouth movements and sucking on fingers or hands.  Fussing or crying.  Avoid introducing a pacifier to your baby in the first 4-6 weeks after your baby is born. After this time, you may choose to use a pacifier. Research has shown that pacifier use during the first year of a baby's life decreases the risk of sudden infant death syndrome (SIDS). Allow your baby to feed on each breast as long as he or she wants. When your baby unlatches or falls asleep while feeding from the first breast, offer the second breast. Because newborns are often sleepy in the first few weeks of life, you may need to awaken your baby to get him or her to feed. Breastfeeding times will vary from baby to baby. However, the following rules can serve as a guide to help you make sure that your baby is  properly fed:  Newborns (babies 4 weeks of age or younger) may breastfeed every 1-3 hours.  Newborns should not go without breastfeeding for longer than 3 hours during the day or 5 hours during the night.  You should breastfeed your baby a minimum of 8 times in a 24-hour period.  Breast milk pumping Pumping and storing breast milk allows you to make sure that your baby is exclusively fed your breast milk, even at times when you are unable to breastfeed. This is especially important if you go back to work while you are still breastfeeding, or if you are not able to be present during feedings. Your lactation consultant can help you find a method of pumping that works best for you and give you guidelines about how long it is safe to store breast milk. Caring for your breasts while you breastfeed Nipples can become dry, cracked, and sore while breastfeeding. The following recommendations can help keep your breasts moisturized and healthy:  Avoid using soap on your nipples.  Wear a supportive bra designed especially for nursing. Avoid wearing underwire-style bras or extremely tight bras (sports bras).  Air-dry your nipples for 3-4 minutes after each feeding.  Use only cotton bra pads to absorb leaked breast milk. Leaking of breast milk between feedings is normal.  Use lanolin on your nipples after breastfeeding. Lanolin helps to maintain your skin's normal moisture barrier. Pure lanolin is not harmful (not toxic) to your baby. You may also hand express a few drops of breast milk and gently massage that milk into your nipples and allow the milk to air-dry.  In the first few weeks after giving birth, some women experience breast engorgement. Engorgement can make your breasts feel heavy, warm, and tender to the touch. Engorgement peaks within 3-5 days after you give birth. The following recommendations can help to ease engorgement:  Completely empty your breasts while breastfeeding or pumping. You  may want to start by applying warm, moist heat (in the shower or with warm, water-soaked hand towels) just before feeding or pumping. This increases circulation and helps the milk flow. If your baby does not completely empty your breasts while breastfeeding, pump any extra milk after he or she is finished.  Apply ice packs to your breasts immediately after breastfeeding or pumping, unless this is too uncomfortable for you. To do this: ? Put ice in a plastic bag. ? Place a towel between your skin and the bag. ? Leave the ice on for 20 minutes, 2-3 times a day.  Make sure that your baby is latched on and positioned properly   while breastfeeding.  If engorgement persists after 48 hours of following these recommendations, contact your health care provider or a Advertising copywriterlactation consultant. Overall health care recommendations while breastfeeding  Eat 3 healthy meals and 3 snacks every day. Well-nourished mothers who are breastfeeding need an additional 450-500 calories a day. You can meet this requirement by increasing the amount of a balanced diet that you eat.  Drink enough water to keep your urine pale yellow or clear.  Rest often, relax, and continue to take your prenatal vitamins to prevent fatigue, stress, and low vitamin and mineral levels in your body (nutrient deficiencies).  Do not use any products that contain nicotine or tobacco, such as cigarettes and e-cigarettes. Your baby may be harmed by chemicals from cigarettes that pass into breast milk and exposure to secondhand smoke. If you need help quitting, ask your health care provider.  Avoid alcohol.  Do not use illegal drugs or marijuana.  Talk with your health care provider before taking any medicines. These include over-the-counter and prescription medicines as well as vitamins and herbal supplements. Some medicines that may be harmful to your baby can pass through breast milk.  It is possible to become pregnant while breastfeeding. If  birth control is desired, ask your health care provider about options that will be safe while breastfeeding your baby. Where to find more information: [employer]: www.llli.org Contact a health care provider if:  You feel like you want to stop breastfeeding or have become frustrated with breastfeeding.  Your nipples are cracked or bleeding.  Your breasts are red, tender, or warm.  You have: ? Painful breasts or nipples. ? A swollen area on either breast. ? A fever or chills. ? Nausea or vomiting. ? Drainage other than breast milk from your nipples.  Your breasts do not become full before feedings by the fifth day after you give birth.  You feel sad and depressed.  Your baby is: ? Too sleepy to eat well. ? Having trouble sleeping. ? More than 981 week old and wetting fewer than 6 diapers in a 24-hour period. ? Not gaining weight by 615 days of age.  Your baby has fewer than 3 stools in a 24-hour period.  Your baby's skin or the white parts of his or her eyes become yellow. Get help right away if:  Your baby is overly tired (lethargic) and does not want to wake up and feed.  Your baby develops an unexplained fever. Summary  Breastfeeding offers many health benefits for infant and mothers.  Try to breastfeed your infant when he or she shows early signs of hunger.  Gently tickle or stroke your baby's lips with your finger or nipple to allow the baby to open his or her mouth. Bring the baby to your breast. Make sure that much of the areola is in your baby's mouth. Offer one side and burp the baby before you offer the other side.  Talk with your health care provider or lactation consultant if you have questions or you face problems as you breastfeed. This information is not intended to replace advice given to you by your health care provider. Make sure you discuss any questions you have with your health care provider. Document Released: 04/30/2005 Document  Revised: 06/01/2016 Document Reviewed: 06/01/2016 Elsevier Interactive Patient Education  2018 [employer]  Informacin sobre la prueba de Woodsidetrabajo de parto despus de Neomia Dearuna cesrea (Trial of Labor After Cesarean Delivery Information) La prueba de trabajo de parto despus de  una cesrea (TOLAC por sus siglas en ingls) es cuando una mujer trata de dar a luz por va vaginal despus de una cesrea anterior. La TOLAC puede ser Neomia Dear opcin segura y [place] segn la historia clnica y otros factores de [place]. Cuando TOLAC tiene xito y es capaz de tener un parto vaginal, esto se llama un parto vaginal despus de Neomia Dear cesrea (PVDC).  CANDIDATAS PARA TOLAC Este procedimiento es posible para algunas mujeres que:  Hayan sido sometidas a uno o dos partos por cesrea en los que la incisin del tero fue horizontal (transversal baja).  Estn esperando gemelos y tuvieron una incisin transversal baja durante una cesrea.  No tienen una cicatriz uterina vertical (clsica).  No han tenido una ruptura en la pared del tero (ruptura uterina). El TOLAC tambin puede intentarse en mujeres que cumplen con los criterios apropiados:  Son menores de [address]  Son altas y tienen un ndice de masa corporal [facility]) inferior a 30.  . Fredderick Phenix cicatriz uterina desconocida.  Dan a luz en un centro equipado para realizar un parto por cesrea de emergencia. Este equipo debe ser capaz de Company secretary las posibles complicaciones, como una ruptura uterina.  Tienen asesoramiento completo [employer] beneficios y riesgos del TOLAC.  Han comentado acerca de futuros planes de embarazo con su mdico.  Han planificado tener varios embarazos ms. CANDIDATAS A TENER MS XITO EN TOLAC:  Han tenido un parto vaginal exitoso antes o despus de su parto por cesrea.  Experimentan un trabajo de parto que comienza, naturalmente, en o antes de la fecha estimada (40 semanas de gestacin).  El beb no es muy grande ( macrosmico).    Han tenido un parto por cesrea anterior, pero actualmente no hay factores que promoveran un parto por cesrea (como una posicin de [place]).  Han tenido un solo parto por cesrea anteriormente.  Tuvo un parto por cesrea antes de realizar el [place] de parto y no despus de Teacher, English as a foreign language. TOLAC puede ser ms apropiado para mujeres que cumplen con las normas anteriores y que planean tener ms embarazos. No se recomienda en partos domiciliarios. CANDIDATAS A TENER MENOS XITO EN TOLAC:  Tienen un parto inducido con un cuello uterino desfavorable. Un cuello uterino desfavorable es cuando no se dilata lo suficiente (entre otros factores).  Nunca han tenido un parto vaginal.  Han tenido ms de dos partos por cesrea.  Tienen un embarazo de ms de 40 semanas de gestacin.  Est embarazada de un un beb con sospecha de un peso mayor de 4.000 gramos (8  libras) y no tiene antecedentes de un parto vaginal.  Han tenido embarazos muy cercanos. BENEFICIOS SUGERIDOS DE LA TOLAC  Tiempo de recuperacin ms rpido.  Permanencia ms breve en el hospital.  Menos dolor y problemas que en un parto por cesrea. Las [employer] tienen un parto por cesrea tienen una mayor probabilidad de necesitar sangre o tener fiebre, una infeccin o un cogulo sanguneo en las piernas. RIESGOS SUGERIDOS DE LA TOLAC El riesgo ms alto de complicaciones ocurre en mujeres que intentan un TOLAC y fracasan. Una TOLAC que fracasa resulta en una cesrea no planificada. Los riesgos relacionados con TOLAC o cesreas repetidas son:   Lulu Riding.  Infeccin.  Cogulos sanguneos.  Lesiones en los rganos o tejidos circundantes.  Menor riesgo de remocin del tero (histerectoma).  Posibles problemas con la placenta (como placenta previa o placenta acreta) en embarazos futuros. Aunque es muy raro, las preocupaciones principales con  el TOLAC son:  Ruptura de la cicatriz uterina de una cesrea  anterior.  Necesidad de una cesrea de Associate Professor.  Tener un mal resultado para el beb (morbilidad perinatal). Irven Shelling MS INFORMACIN [employer]ians and Gynecologists ([employer] Gineclogos): www.acog.org [employer] of Nurse-Midwives ([employer] de Enfermeras - parteras): www.midwife.org Esta informacin no tiene Theme park manager el consejo del mdico. Asegrese de hacerle al mdico cualquier pregunta que tenga. Document Released: 04/19/2011 Document Revised: 02/18/2013 Elsevier Interactive Patient Education  2017 [employer].

## 2018-04-02 NOTE — Progress Notes (Signed)
routin
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
## 2018-04-02 NOTE — Progress Notes (Signed)

## 2018-04-07 ENCOUNTER — Ambulatory Visit (INDEPENDENT_AMBULATORY_CARE_PROVIDER_SITE_OTHER): Payer: Medicaid Other | Admitting: *Deleted

## 2018-04-07 DIAGNOSIS — O24410 Gestational diabetes mellitus in pregnancy, diet controlled: Secondary | ICD-10-CM

## 2018-04-07 LAB — GLUCOSE, CAPILLARY: GLUCOSE-CAPILLARY: 76 mg/dL (ref 70–99)

## 2018-04-07 NOTE — Progress Notes (Addendum)
Here for fasting cbg which was 76, states only has sip water. Will route to Dr. Shawnie PonsPratt.  Bonita QuinLinda ,RN

## 2018-04-07 NOTE — Progress Notes (Signed)
Patient seen and assessed by nursing staff.  Agree with documentation and plan.  

## 2018-04-09 ENCOUNTER — Ambulatory Visit (INDEPENDENT_AMBULATORY_CARE_PROVIDER_SITE_OTHER): Payer: Medicaid Other | Admitting: Obstetrics & Gynecology

## 2018-04-09 ENCOUNTER — Ambulatory Visit: Payer: Medicaid Other | Admitting: Internal Medicine

## 2018-04-09 ENCOUNTER — Ambulatory Visit (INDEPENDENT_AMBULATORY_CARE_PROVIDER_SITE_OTHER): Payer: Medicaid Other | Admitting: Internal Medicine

## 2018-04-09 VITALS — BP 119/57 | HR 90 | Wt 169.0 lb

## 2018-04-09 DIAGNOSIS — O0990 Supervision of high risk pregnancy, unspecified, unspecified trimester: Secondary | ICD-10-CM

## 2018-04-09 DIAGNOSIS — Z3A33 33 weeks gestation of pregnancy: Secondary | ICD-10-CM

## 2018-04-09 DIAGNOSIS — R7309 Other abnormal glucose: Secondary | ICD-10-CM

## 2018-04-09 DIAGNOSIS — O24410 Gestational diabetes mellitus in pregnancy, diet controlled: Secondary | ICD-10-CM | POA: Diagnosis not present

## 2018-04-09 DIAGNOSIS — O0993 Supervision of high risk pregnancy, unspecified, third trimester: Secondary | ICD-10-CM
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
## 2018-04-09 NOTE — Patient Instructions (Signed)
Call the office or go to [facility] if:  You begin to have strong, frequent contractions  Your water breaks.  Sometimes it is a big gush of fluid, sometimes it is just a trickle that keeps getting your panties wet or running down your legs  You have vaginal bleeding.  It is normal to have a small amount of spotting if your cervix was checked.   You don't feel your baby moving like normal.  If you don't, get you something to eat and drink and lay down and focus on feeling your baby move.  You should feel at least 10 movements in 2 hours.  If you don't, you should call the office or go to [facility].     Third Trimester of Pregnancy The third trimester is from week 29 through week 42, months 7 through 9. The third trimester is a time when the fetus is growing rapidly. At the end of the ninth month, the fetus is about 20 inches in length and weighs 6-10 pounds.  BODY CHANGES Your body goes through many changes during pregnancy. The changes vary from woman to woman.   Your weight will continue to increase. You can expect to gain 25-35 pounds (11-16 kg) by the end of the pregnancy.  You may begin to get stretch marks on your hips, abdomen, and breasts.  You may urinate more often because the fetus is moving lower into your pelvis and pressing on your bladder.  You may develop or continue to have heartburn as a result of your pregnancy.  You may develop constipation because certain hormones are causing the muscles that push waste through your intestines to slow down.  You may develop hemorrhoids or swollen, bulging veins (varicose veins).  You may have pelvic pain because of the weight gain and pregnancy hormones relaxing your joints between the bones in your pelvis. Backaches may result from overexertion of the muscles supporting your posture.  You may have changes in your hair. These can include thickening of your hair, rapid growth, and changes in texture. Some women also  have hair loss during or after pregnancy, or hair that feels dry or thin. Your hair will most likely return to normal after your baby is born.  Your breasts will continue to grow and be tender. A yellow discharge may leak from your breasts called colostrum.  Your belly button may stick out.  You may feel short of breath because of your expanding uterus.  You may notice the fetus "dropping," or moving lower in your abdomen.  You may have a bloody mucus discharge. This usually occurs a few days to a week before labor begins.  Your cervix becomes thin and soft (effaced) near your due date. WHAT TO EXPECT AT YOUR PRENATAL EXAMS  You will have prenatal exams every 2 weeks until week 36. Then, you will have weekly prenatal exams. During a routine prenatal visit:  You will be weighed to make sure you and the fetus are growing normally.  Your blood pressure is taken.  Your abdomen will be measured to track your baby's growth.  The fetal heartbeat will be listened to.  Any test results from the previous visit will be discussed.  You may have a cervical check near your due date to see if you have effaced. At around 36 weeks, your caregiver will check your cervix. At the same time, your caregiver will also perform a test on the secretions of the vaginal tissue. This test is to   if a type of bacteria, Group B streptococcus, is present. Your caregiver will explain this further. Your caregiver may ask you:  What your birth plan is.  How you are feeling.  If you are feeling the baby move.  If you have had any abnormal symptoms, such as leaking fluid, bleeding, severe headaches, or abdominal cramping.  If you have any questions. Other tests or screenings that may be performed during your third trimester include:  Blood tests that check for low iron levels (anemia).  Fetal testing to check the health, activity level, and growth of the fetus. Testing is done if you have certain medical  conditions or if there are problems during the pregnancy. FALSE LABOR You may feel small, irregular contractions that eventually go away. These are called Braxton Hicks contractions, or false labor. Contractions may last for hours, days, or even weeks before true labor sets in. If contractions come at regular intervals, intensify, or become painful, it is best to be seen by your caregiver.  SIGNS OF LABOR   Menstrual-like cramps.  Contractions that are 5 minutes apart or less.  Contractions that start on the top of the uterus and spread down to the lower abdomen and back.  A sense of increased pelvic pressure or back pain.  A watery or bloody mucus discharge that comes from the vagina. If you have any of these signs before the 37th week of pregnancy, call your caregiver right away. You need to go to the hospital to get checked immediately. HOME CARE INSTRUCTIONS   Avoid all smoking, herbs, alcohol, and unprescribed drugs. These chemicals affect the formation and growth of the baby.  Follow your caregiver's instructions regarding medicine use. There are medicines that are either safe or unsafe to take during pregnancy.  Exercise only as directed by your caregiver. Experiencing uterine cramps is a good sign to stop exercising.  Continue to eat regular, healthy meals.  Wear a good support bra for breast tenderness.  Do not use hot tubs, steam rooms, or saunas.  Wear your seat belt at all times when driving.  Avoid raw meat, uncooked cheese, cat litter boxes, and soil used by cats. These carry germs that can cause birth defects in the baby.  Take your prenatal vitamins.  Try taking a stool softener (if your caregiver approves) if you develop constipation. Eat more high-fiber foods, such as fresh vegetables or fruit and whole grains. Drink plenty of fluids to keep your urine clear or pale yellow.  Take warm sitz baths to soothe any pain or discomfort caused by hemorrhoids. Use  hemorrhoid cream if your caregiver approves.  If you develop varicose veins, wear support hose. Elevate your feet for 15 minutes, 3-4 times a day. Limit salt in your diet.  Avoid heavy lifting, wear low heal shoes, and practice good posture.  Rest a lot with your legs elevated if you have leg cramps or low back pain.  Visit your dentist if you have not gone during your pregnancy. Use a soft toothbrush to brush your teeth and be gentle when you floss.  A sexual relationship may be continued unless your caregiver directs you otherwise.  Do not travel far distances unless it is absolutely necessary and only with the approval of your caregiver.  Take prenatal classes to understand, practice, and ask questions about the labor and delivery.  Make a trial run to the hospital.  Pack your hospital bag.  Prepare the baby's nursery.  Continue to go to all your  your prenatal visits as directed by your caregiver. SEEK MEDICAL CARE IF:  You are unsure if you are in labor or if your water has broken.  You have dizziness.  You have mild pelvic cramps, pelvic pressure, or nagging pain in your abdominal area.  You have persistent nausea, vomiting, or diarrhea.  You have a bad smelling vaginal discharge.  You have pain with urination. SEEK IMMEDIATE MEDICAL CARE IF:   You have a fever.  You are leaking fluid from your vagina.  You have spotting or bleeding from your vagina.  You have severe abdominal cramping or pain.  You have rapid weight loss or gain.  You have shortness of breath with chest pain.  You notice sudden or extreme swelling of your face, hands, ankles, feet, or legs.  You have not felt your baby move in over an hour.  You have severe headaches that do not go away with medicine.  You have vision changes. Document Released: 04/24/2001 Document Revised: 05/05/2013 Document Reviewed: 07/01/2012 [employer] Patient Information 2015 [employer]. This information is not  intended to replace advice given to you by your health care provider. Make sure you discuss any questions you have with your health care provider.  
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
## 2018-04-09 NOTE — Progress Notes (Signed)
Error

## 2018-04-24 ENCOUNTER — Ambulatory Visit (INDEPENDENT_AMBULATORY_CARE_PROVIDER_SITE_OTHER): Payer: Medicaid Other | Admitting: Obstetrics & Gynecology

## 2018-04-24 ENCOUNTER — Encounter: Payer: Self-pay | Admitting: Obstetrics & Gynecology

## 2018-04-24 ENCOUNTER — Other Ambulatory Visit (HOSPITAL_COMMUNITY)
Admission: RE | Admit: 2018-04-24 | Discharge: 2018-04-24 | Disposition: A | Discharge status: Home / Self Care | Payer: Medicaid Other | Source: Ambulatory Visit | Attending: Obstetrics & Gynecology | Admitting: Obstetrics & Gynecology

## 2018-04-24 VITALS — BP 139/89 | HR 112 | Wt 171.5 lb

## 2018-04-24 DIAGNOSIS — O0990 Supervision of high risk pregnancy, unspecified, unspecified trimester: Secondary | ICD-10-CM

## 2018-04-24 DIAGNOSIS — O0993 Supervision of high risk pregnancy, unspecified, third trimester: Secondary | ICD-10-CM

## 2018-04-24 NOTE — Patient Instructions (Signed)
Vaginal Delivery Vaginal delivery means that you will give birth by pushing your baby out of your birth canal (vagina). A team of health care providers will help you before, during, and after vaginal delivery. Birth experiences are unique for every woman and every pregnancy, and birth experiences vary depending on where you choose to give birth. What should I do to prepare for my baby's birth? Before your baby is born, it is important to talk with your health care provider about:  Your labor and delivery preferences. These may include: ? Medicines that you may be given. ? How you will manage your pain. This might include non-medical pain relief techniques or injectable pain relief such as epidural analgesia. ? How you and your baby will be monitored during labor and delivery. ? Who may be in the labor and delivery room with you. ? Your feelings about surgical delivery of your baby (cesarean delivery, or C-section) if this becomes necessary. ? Your feelings about receiving donated blood through an IV tube (blood transfusion) if this becomes necessary.  Whether you are able: ? To take pictures or videos of the birth. ? To eat during labor and delivery. ? To move around, walk, or change positions during labor and delivery.  What to expect after your baby is born, such as: ? Whether delayed umbilical cord clamping and cutting is offered. ? Who will care for your baby right after birth. ? Medicines or tests that may be recommended for your baby. ? Whether breastfeeding is supported in your hospital or birth center. ? How long you will be in the hospital or birth center.  How any medical conditions you have may affect your baby or your labor and delivery experience.  To prepare for your baby's birth, you should also:  Attend all of your health care visits before delivery (prenatal visits) as recommended by your health care provider. This is important.  Prepare your home for your baby's  arrival. Make sure that you have: ? Diapers. ? Baby clothing. ? Feeding equipment. ? Safe sleeping arrangements for you and your baby.  Install a car seat in your vehicle. Have your car seat checked by a certified car seat installer to make sure that it is installed safely.  Think about who will help you with your new baby at home for at least the first several weeks after delivery.  What can I expect when I arrive at the birth center or hospital? Once you are in labor and have been admitted into the hospital or birth center, your health care provider may:  Review your pregnancy history and any concerns you have.  Insert an IV tube into one of your veins. This is used to give you fluids and medicines.  Check your blood pressure, pulse, temperature, and heart rate (vital signs).  Check whether your bag of water (amniotic sac) has broken (ruptured).  Talk with you about your birth plan and discuss pain control options.  Monitoring Your health care provider may monitor your contractions (uterine monitoring) and your baby's heart rate (fetal monitoring). You may need to be monitored:  Often, but not continuously (intermittently).  All the time or for long periods at a time (continuously). Continuous monitoring may be needed if: ? You are taking certain medicines, such as medicine to relieve pain or make your contractions stronger. ? You have pregnancy or labor complications.  Monitoring may be done by:  Placing a special stethoscope or a handheld monitoring device on your abdomen to   check your baby's heartbeat, and feeling your abdomen for contractions. This method of monitoring does not continuously record your baby's heartbeat or your contractions.  Placing monitors on your abdomen (external monitors) to record your baby's heartbeat and the frequency and length of contractions. You may not have to wear external monitors all the time.  Placing monitors inside of your uterus  (internal monitors) to record your baby's heartbeat and the frequency, length, and strength of your contractions. ? Your health care provider may use internal monitors if he or she needs more information about the strength of your contractions or your baby's heart rate. ? Internal monitors are put in place by passing a thin, flexible wire through your vagina and into your uterus. Depending on the type of monitor, it may remain in your uterus or on your baby's head until birth. ? Your health care provider will discuss the benefits and risks of internal monitoring with you and will ask for your permission before inserting the monitors.  Telemetry. This is a type of continuous monitoring that can be done with external or internal monitors. Instead of having to stay in bed, you are able to move around during telemetry. Ask your health care provider if telemetry is an option for you.  Physical exam Your health care provider may perform a physical exam. This may include:  Checking whether your baby is positioned: ? With the head toward your vagina (head-down). This is most common. ? With the head toward the top of your uterus (head-up or breech). If your baby is in a breech position, your health care provider may try to turn your baby to a head-down position so you can deliver vaginally. If it does not seem that your baby can be born vaginally, your provider may recommend surgery to deliver your baby. In rare cases, you may be able to deliver vaginally if your baby is head-up (breech delivery). ? Lying sideways (transverse). Babies that are lying sideways cannot be delivered vaginally.  Checking your cervix to determine: ? Whether it is thinning out (effacing). ? Whether it is opening up (dilating). ? How low your baby has moved into your birth canal.  What are the three stages of labor and delivery?  Normal labor and delivery is divided into the following three stages: Stage 1  Stage 1 is the  longest stage of labor, and it can last for hours or days. Stage 1 includes: ? Early labor. This is when contractions may be irregular, or regular and mild. Generally, early labor contractions are more than 10 minutes apart. ? Active labor. This is when contractions get longer, more regular, more frequent, and more intense. ? The transition phase. This is when contractions happen very close together, are very intense, and may last longer than during any other part of labor.  Contractions generally feel mild, infrequent, and irregular at first. They get stronger, more frequent (about every 2-3 minutes), and more regular as you progress from early labor through active labor and transition.  Many women progress through stage 1 naturally, but you may need help to continue making progress. If this happens, your health care provider may talk with you about: ? Rupturing your amniotic sac if it has not ruptured yet. ? Giving you medicine to help make your contractions stronger and more frequent.  Stage 1 ends when your cervix is completely dilated to 4 inches (10 cm) and completely effaced. This happens at the end of the transition phase. Stage 2  Once   your cervix is completely effaced and dilated to 4 inches (10 cm), you may start to feel an urge to push. It is common for the body to naturally take a rest before feeling the urge to push, especially if you received an epidural or certain other pain medicines. This rest period may last for up to 1-2 hours, depending on your unique labor experience.  During stage 2, contractions are generally less painful, because pushing helps relieve contraction pain. Instead of contraction pain, you may feel stretching and burning pain, especially when the widest part of your baby's head passes through the vaginal opening (crowning).  Your health care provider will closely monitor your pushing progress and your baby's progress through the vagina during stage 2.  Your  health care provider may massage the area of skin between your vaginal opening and anus (perineum) or apply warm compresses to your perineum. This helps it stretch as the baby's head starts to crown, which can help prevent perineal tearing. ? In some cases, an incision may be made in your perineum (episiotomy) to allow the baby to pass through the vaginal opening. An episiotomy helps to make the opening of the vagina larger to allow more room for the baby to fit through.  It is very important to breathe and focus so your health care provider can control the delivery of your baby's head. Your health care provider may have you decrease the intensity of your pushing, to help prevent perineal tearing.  After delivery of your baby's head, the shoulders and the rest of the body generally deliver very quickly and without difficulty.  Once your baby is delivered, the umbilical cord may be cut right away, or this may be delayed for 1-2 minutes, depending on your baby's health. This may vary among health care providers, hospitals, and birth centers.  If you and your baby are healthy enough, your baby may be placed on your chest or abdomen to help maintain the baby's temperature and to help you bond with each other. Some mothers and babies start breastfeeding at this time. Your health care team will dry your baby and help keep your baby warm during this time.  Your baby may need immediate care if he or she: ? Showed signs of distress during labor. ? Has a medical condition. ? Was born too early (prematurely). ? Had a bowel movement before birth (meconium). ? Shows signs of difficulty transitioning from being inside the uterus to being outside of the uterus. If you are planning to breastfeed, your health care team will help you begin a feeding. Stage 3  The third stage of labor starts immediately after the birth of your baby and ends after you deliver the placenta. The placenta is an organ that develops  during pregnancy to provide oxygen and nutrients to your baby in the womb.  Delivering the placenta may require some pushing, and you may have mild contractions. Breastfeeding can stimulate contractions to help you deliver the placenta.  After the placenta is delivered, your uterus should tighten (contract) and become firm. This helps to stop bleeding in your uterus. To help your uterus contract and to control bleeding, your health care provider may: ? Give you medicine by injection, through an IV tube, by mouth, or through your rectum (rectally). ? Massage your abdomen or perform a vaginal exam to remove any blood clots that are left in your uterus. ? Empty your bladder by placing a thin, flexible tube (catheter) into your bladder. ? Encourage   you to breastfeed your baby. After labor is over, you and your baby will be monitored closely to ensure that you are both healthy until you are ready to go home. Your health care team will teach you how to care for yourself and your baby. This information is not intended to replace advice given to you by your health care provider. Make sure you discuss any questions you have with your health care provider. Document Released: 02/07/2008 Document Revised: 11/18/2015 Document Reviewed: 05/15/2015 Elsevier Interactive Patient Education  2018 [employer]  
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
## 2018-04-25 LAB — GC/CHLAMYDIA PROBE AMP (~~LOC~~) NOT AT ARMC
Chlamydia: NEGATIVE
Neisseria Gonorrhea: NEGATIVE

## 2018-04-29 LAB — CULTURE, BETA STREP (GROUP B ONLY): STREP GP B CULTURE: NEGATIVE

## 2018-04-30 ENCOUNTER — Ambulatory Visit (INDEPENDENT_AMBULATORY_CARE_PROVIDER_SITE_OTHER): Payer: Medicaid Other | Admitting: Obstetrics and Gynecology

## 2018-04-30 ENCOUNTER — Encounter: Payer: Self-pay | Admitting: Obstetrics and Gynecology

## 2018-04-30 VITALS — BP 117/78 | HR 99 | Wt 172.6 lb

## 2018-04-30 DIAGNOSIS — O0990 Supervision of high risk pregnancy, unspecified, unspecified trimester: Secondary | ICD-10-CM

## 2018-04-30 DIAGNOSIS — O0993 Supervision of high risk pregnancy, unspecified, third trimester: Secondary | ICD-10-CM
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
## 2018-05-02 ENCOUNTER — Telehealth: Payer: Self-pay

## 2018-05-02 NOTE — Telephone Encounter (Signed)
Pt called asking if safe to travel.  Pt informed me that she will be riding in a care for 5 hours.  I explained to the pt to use her decreation because she is full term however pt states that she is not having contractions at this time.  I explained to pt that if she decides to take the trip then to make sure that she gets out and walks every two hours,drink plenty of water, and make sure she is taking rest breaks.  Pt stated understanding.

## 2018-05-08 ENCOUNTER — Ambulatory Visit (INDEPENDENT_AMBULATORY_CARE_PROVIDER_SITE_OTHER): Payer: Medicaid Other | Admitting: Obstetrics and Gynecology

## 2018-05-08 ENCOUNTER — Encounter: Payer: Self-pay | Admitting: Obstetrics and Gynecology

## 2018-05-08 VITALS — BP 126/82 | HR 106 | Wt 175.3 lb

## 2018-05-08 DIAGNOSIS — Z3493 Encounter for supervision of normal pregnancy, unspecified, third trimester: Secondary | ICD-10-CM
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
## 2018-05-09 ENCOUNTER — Telehealth: Payer: Self-pay | Admitting: *Deleted

## 2018-05-09 NOTE — Telephone Encounter (Signed)
Pt left VM stating that she has been very lightheaded and dizzy yesterday and today. She also has been having UC's. I called pt and discussed her concerns. She stated that the UC's were strong earlier today but now have stopped. Pt advised to come to hospital when UC's are every 5 minutes for more than 1 hour. She is dizzy when she sits up or stands and feels like she may pass out. She has been drinking plenty of fluids and resting. She is not dizzy when lying down. I advised pt that if the dizziness persists with standing or sitting, she should come to MAU for evaluation. Pt voiced understanding.

## 2018-05-12 ENCOUNTER — Telehealth: Payer: Self-pay | Admitting: Family Medicine

## 2018-05-12 ENCOUNTER — Encounter: Payer: Self-pay | Admitting: Family Medicine

## 2018-05-12 NOTE — Telephone Encounter (Signed)
Pt came in stating that someone had just called her about an appt for today @ 2:35 and she is unable to make it, she needs something earlier or later. Rescheduled pt for Friday because this worked better for her.

## 2018-05-12 NOTE — Telephone Encounter (Signed)
Called patient @9 :10am and informed of follow appointment today.

## 2018-05-14 NOTE — L&D Delivery Note (Addendum)
Delivery Note At 4:31 PM a viable female was delivered via Vaginal, Spontaneous (Presentation: LOA, vertex).  APGAR: 8, 9; weight pending.   Placenta status: spontaneously delivered, intact.  Cord: 3-vessel with the following complications: none.  Cord pH: not tested  Anesthesia: Epidural Episiotomy: None Lacerations: None Suture Repair: None Est. Blood Loss (mL): 284 cc  Mom to postpartum.  Baby to Couplet care / Skin to Skin.  Dollene Cleveland 05/18/2018, 4:47 PM  Patient was admitted in early labor augmented with Cytotec and Pitosin. She developed a fever prior to delivery reaching as high as 102*F. Ampicillin and Gentamicin were given. Baby born en caul.  I was present and gloved with the resident for the entire delivery. I agree with the above note.   Thressa Sheller DNP, CNM  05/18/18  6:01 PM

## 2018-05-16 ENCOUNTER — Ambulatory Visit (INDEPENDENT_AMBULATORY_CARE_PROVIDER_SITE_OTHER): Payer: Medicaid Other | Admitting: Obstetrics and Gynecology

## 2018-05-16 VITALS — BP 123/81 | HR 91 | Wt 174.0 lb

## 2018-05-16 DIAGNOSIS — Z3493 Encounter for supervision of normal pregnancy, unspecified, third trimester: Secondary | ICD-10-CM
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
## 2018-05-17 ENCOUNTER — Encounter (HOSPITAL_COMMUNITY): Payer: Self-pay | Admitting: *Deleted

## 2018-05-17 ENCOUNTER — Inpatient Hospital Stay (HOSPITAL_COMMUNITY)
Admission: AD | Admit: 2018-05-17 | Discharge: 2018-05-20 | DRG: 807 | Disposition: A | Discharge status: Home / Self Care | Payer: Medicaid Other | Attending: Obstetrics and Gynecology | Admitting: Obstetrics and Gynecology

## 2018-05-17 ENCOUNTER — Other Ambulatory Visit: Payer: Self-pay

## 2018-05-17 DIAGNOSIS — O9902 Anemia complicating childbirth: Secondary | ICD-10-CM | POA: Diagnosis present

## 2018-05-17 DIAGNOSIS — Z87891 Personal history of nicotine dependence: Secondary | ICD-10-CM | POA: Diagnosis not present

## 2018-05-17 DIAGNOSIS — Z3493 Encounter for supervision of normal pregnancy, unspecified, third trimester: Secondary | ICD-10-CM

## 2018-05-17 DIAGNOSIS — O9952 Diseases of the respiratory system complicating childbirth: Secondary | ICD-10-CM | POA: Diagnosis present

## 2018-05-17 DIAGNOSIS — Z3A39 39 weeks gestation of pregnancy: Secondary | ICD-10-CM | POA: Diagnosis not present

## 2018-05-17 DIAGNOSIS — Z23 Encounter for immunization: Secondary | ICD-10-CM | POA: Diagnosis not present

## 2018-05-17 DIAGNOSIS — Z3483 Encounter for supervision of other normal pregnancy, third trimester: Secondary | ICD-10-CM | POA: Diagnosis present

## 2018-05-17 DIAGNOSIS — O411230 Chorioamnionitis, third trimester, not applicable or unspecified: Principal | ICD-10-CM | POA: Diagnosis present

## 2018-05-17 DIAGNOSIS — D573 Sickle-cell trait: Secondary | ICD-10-CM | POA: Diagnosis present

## 2018-05-17 DIAGNOSIS — J4520 Mild intermittent asthma, uncomplicated: Secondary | ICD-10-CM | POA: Diagnosis present

## 2018-05-17 LAB — URINALYSIS, ROUTINE W REFLEX MICROSCOPIC
BILIRUBIN URINE: NEGATIVE
Glucose, UA: NEGATIVE mg/dL
HGB URINE DIPSTICK: NEGATIVE
Ketones, ur: NEGATIVE mg/dL
Leukocytes, UA: NEGATIVE
NITRITE: NEGATIVE
Protein, ur: NEGATIVE mg/dL
Specific Gravity, Urine: 1.009 (ref 1.005–1.030)
pH: 5 (ref 5.0–8.0)

## 2018-05-17 LAB — TYPE AND SCREEN
ABO/RH(D): O POS
Antibody Screen: NEGATIVE

## 2018-05-17 LAB — CBC
HEMATOCRIT: 35 % — AB (ref 36.0–46.0)
Hemoglobin: 11.3 g/dL — ABNORMAL LOW (ref 12.0–15.0)
MCH: 25.6 pg — ABNORMAL LOW (ref 26.0–34.0)
MCHC: 32.3 g/dL (ref 30.0–36.0)
MCV: 79.2 fL — ABNORMAL LOW (ref 80.0–100.0)
Platelets: 253 10*3/uL (ref 150–400)
RBC: 4.42 MIL/uL (ref 3.87–5.11)
RDW: 16 % — ABNORMAL HIGH (ref 11.5–15.5)
WBC: 13.6 10*3/uL — ABNORMAL HIGH (ref 4.0–10.5)
nRBC: 0 % (ref 0.0–0.2)

## 2018-05-17 MED ORDER — PHENYLEPHRINE 40 MCG/ML (10ML) SYRINGE FOR IV PUSH (FOR BLOOD PRESSURE SUPPORT)
80.0000 ug | PREFILLED_SYRINGE | INTRAVENOUS | Status: DC | PRN
  Filled 2018-05-17: qty 10

## 2018-05-17 MED ORDER — OXYTOCIN 40 UNITS IN LACTATED RINGERS INFUSION - SIMPLE MED
1.0000 m[IU]/min | INTRAVENOUS | Status: DC
  Administered 2018-05-18: 2 m[IU]/min via INTRAVENOUS
  Filled 2018-05-17: qty 1000

## 2018-05-17 MED ORDER — FENTANYL CITRATE (PF) 100 MCG/2ML IJ SOLN
100.0000 ug | INTRAMUSCULAR | Status: DC | PRN
  Administered 2018-05-17: 100 ug via INTRAVENOUS
  Filled 2018-05-17: qty 2

## 2018-05-17 MED ORDER — DIPHENHYDRAMINE HCL 50 MG/ML IJ SOLN
12.5000 mg | INTRAMUSCULAR | Status: DC | PRN
  Administered 2018-05-18 (×2): 12.5 mg via INTRAVENOUS
  Filled 2018-05-17 (×2): qty 1

## 2018-05-17 MED ORDER — EPHEDRINE 5 MG/ML INJ
10.0000 mg | INTRAVENOUS | Status: DC | PRN
  Filled 2018-05-17: qty 2

## 2018-05-17 MED ORDER — ACETAMINOPHEN 325 MG PO TABS
650.0000 mg | ORAL_TABLET | ORAL | Status: DC | PRN
  Administered 2018-05-17 – 2018-05-18 (×2): 650 mg via ORAL
  Filled 2018-05-17 (×2): qty 2

## 2018-05-17 MED ORDER — MISOPROSTOL 50MCG HALF TABLET
50.0000 ug | ORAL_TABLET | ORAL | Status: DC | PRN
  Administered 2018-05-17: 50 ug via BUCCAL
  Filled 2018-05-17 (×2): qty 1

## 2018-05-17 MED ORDER — OXYCODONE-ACETAMINOPHEN 5-325 MG PO TABS: 1.0000 | ORAL_TABLET | ORAL | Status: DC | PRN

## 2018-05-17 MED ORDER — OXYCODONE-ACETAMINOPHEN 5-325 MG PO TABS: 2.0000 | ORAL_TABLET | ORAL | Status: DC | PRN

## 2018-05-17 MED ORDER — FENTANYL 2.5 MCG/ML BUPIVACAINE 1/10 % EPIDURAL INFUSION (WH - ANES)
14.0000 mL/h | INTRAMUSCULAR | Status: DC | PRN
  Administered 2018-05-18 (×3): 14 mL/h via EPIDURAL
  Filled 2018-05-17 (×3): qty 100

## 2018-05-17 MED ORDER — ZOLPIDEM TARTRATE 5 MG PO TABS
5.0000 mg | ORAL_TABLET | Freq: Every evening | ORAL | Status: DC | PRN
  Administered 2018-05-18: 5 mg via ORAL
  Filled 2018-05-17: qty 1

## 2018-05-17 MED ORDER — ONDANSETRON HCL 4 MG/2ML IJ SOLN
4.0000 mg | Freq: Four times a day (QID) | INTRAMUSCULAR | Status: DC | PRN
  Administered 2018-05-18: 4 mg via INTRAVENOUS
  Filled 2018-05-17: qty 2

## 2018-05-17 MED ORDER — PHENYLEPHRINE 40 MCG/ML (10ML) SYRINGE FOR IV PUSH (FOR BLOOD PRESSURE SUPPORT)
80.0000 ug | PREFILLED_SYRINGE | INTRAVENOUS | Status: DC | PRN
  Filled 2018-05-17 (×2): qty 10

## 2018-05-17 MED ORDER — LACTATED RINGERS IV SOLN: 500.0000 mL | INTRAVENOUS | Status: DC | PRN

## 2018-05-17 MED ORDER — MISOPROSTOL 25 MCG QUARTER TABLET: 25.0000 ug | ORAL_TABLET | ORAL | Status: DC | PRN

## 2018-05-17 MED ORDER — SOD CITRATE-CITRIC ACID 500-334 MG/5ML PO SOLN
30.0000 mL | ORAL | Status: DC | PRN
  Administered 2018-05-18 (×2): 30 mL via ORAL
  Filled 2018-05-17 (×2): qty 15

## 2018-05-17 MED ORDER — OXYTOCIN 40 UNITS IN LACTATED RINGERS INFUSION - SIMPLE MED: 2.5000 [IU]/h | INTRAVENOUS | Status: DC

## 2018-05-17 MED ORDER — TERBUTALINE SULFATE 1 MG/ML IJ SOLN
0.2500 mg | Freq: Once | INTRAMUSCULAR | Status: DC | PRN
  Filled 2018-05-17: qty 1

## 2018-05-17 MED ORDER — LACTATED RINGERS IV SOLN
INTRAVENOUS | Status: DC
  Administered 2018-05-17 (×2): via INTRAVENOUS

## 2018-05-17 MED ORDER — LIDOCAINE HCL (PF) 1 % IJ SOLN
30.0000 mL | INTRAMUSCULAR | Status: DC | PRN
  Filled 2018-05-17: qty 30

## 2018-05-17 MED ORDER — OXYTOCIN BOLUS FROM INFUSION
500.0000 mL | Freq: Once | INTRAVENOUS | Status: AC
  Administered 2018-05-18: 500 mL via INTRAVENOUS

## 2018-05-17 MED ORDER — LACTATED RINGERS IV SOLN
500.0000 mL | Freq: Once | INTRAVENOUS | Status: AC
  Administered 2018-05-17: 500 mL via INTRAVENOUS

## 2018-05-17 NOTE — MAU Note (Signed)
Ctxs all day. Mucousy d/c. Watery d/c since this am after I pulled out mucous plug. No bleeding. 4cm on Friday

## 2018-05-17 NOTE — Anesthesia Pain Management Evaluation Note (Signed)
  CRNA Pain Management Visit Note  Patient: Audrey Pearson, 28 y.o., female  "Hello I am a member of the anesthesia team at [facility]. We have an anesthesia team available at all times to provide care throughout the hospital, including epidural management and anesthesia for C-section. I don't know your plan for the delivery whether it a natural birth, water birth, IV sedation, nitrous supplementation, doula or epidural, but we want to meet your pain goals."   1.Was your pain managed to your expectations on prior hospitalizations?   Yes   2.What is your expectation for pain management during this hospitalization?     Epidural  3.How can we help you reach that goal? epidural  Record the patient's initial score and the patient's pain goal.   Pain: 0  Pain Goal: 5 [facility] wants you to be able to say your pain was always managed very well.  Kimbrely Buckel 05/17/2018
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
## 2018-05-17 NOTE — Progress Notes (Signed)
[facility] OB[facility]N Attending Note  Subjective:  Called to evaluate patient with cervical examination discrepancy. Was 3c5 cm dilated in the office on 05/16/18, thought to be 5.5 - 6 cm in MAU and admitted for spontaneous onset of labor.  Currently, her RN thinks she has not changed since office examination or could be less dilated.  FHR reassuring, frequent 5-6 minute contractions, no LOF or vaginal bleeding. Good FM.  Patient received Fentanyl for analgesia, desires epidural.    Objective:  Blood pressure 130/79, pulse (!) 106, temperature 98.6 F (37 C), temperature source Oral, resp. rate 18, height 5\' 6"  (1.676 m), weight 80.3 kg, last menstrual period 08/12/2017, SpO2 100 %, not currently breastfeeding. FHT  Baseline 120 bpm, moderate variability,  no accelerations, no decelerations Toco: q 5-6 mins Gen: NAD HENT: Normocephalic, atraumatic Lungs: Normal respiratory effort Heart: Regular rate noted Abdomen: NT, gravid fundus, soft Cervix: Dilation: 3 Effacement (%): 50 Station: -3 Presentation: Vertex Exam by:: Dr. Macon Large Ext: 2+ DTRs, no edema, no cyanosis, negative Homan's sign  Assessment & Plan:  28 y.o. G3P1011 at [redacted]w[redacted]d admitted for labor; currently in latent phase. Given multiparity and GA >39 weeks, will proceed with augmentation with misoprostol, then pitocin and AROM.  Reassuring FHR tracing.  GBS neg. Hopeful for vaginal delivery.  Continue routine L&D care.   Jaynie Collins, MD, FACOG Obstetrician & Gynecologist, [facility] for [employer], [facility]

## 2018-05-18 ENCOUNTER — Inpatient Hospital Stay (HOSPITAL_COMMUNITY): Payer: Medicaid Other | Admitting: Anesthesiology

## 2018-05-18 ENCOUNTER — Encounter (HOSPITAL_COMMUNITY): Payer: Self-pay | Admitting: *Deleted

## 2018-05-18 DIAGNOSIS — Z3A39 39 weeks gestation of pregnancy: Secondary | ICD-10-CM

## 2018-05-18 LAB — RPR: RPR Ser Ql: NONREACTIVE

## 2018-05-18 MED ORDER — PRENATAL MULTIVITAMIN CH
1.0000 | ORAL_TABLET | Freq: Every day | ORAL | Status: DC
  Administered 2018-05-19 – 2018-05-20 (×2): 1 via ORAL
  Filled 2018-05-18 (×2): qty 1

## 2018-05-18 MED ORDER — ONDANSETRON HCL 4 MG/2ML IJ SOLN: 4.0000 mg | INTRAMUSCULAR | Status: DC | PRN

## 2018-05-18 MED ORDER — ALBUTEROL SULFATE (2.5 MG/3ML) 0.083% IN NEBU
2.5000 mg | INHALATION_SOLUTION | Freq: Four times a day (QID) | RESPIRATORY_TRACT | Status: DC | PRN
  Administered 2018-05-18: 2.5 mg via RESPIRATORY_TRACT
  Filled 2018-05-18: qty 3

## 2018-05-18 MED ORDER — ONDANSETRON HCL 4 MG PO TABS: 4.0000 mg | ORAL_TABLET | ORAL | Status: DC | PRN

## 2018-05-18 MED ORDER — OXYCODONE-ACETAMINOPHEN 5-325 MG PO TABS
1.0000 | ORAL_TABLET | ORAL | Status: DC | PRN
  Administered 2018-05-20 (×3): 1 via ORAL
  Filled 2018-05-18 (×2): qty 1

## 2018-05-18 MED ORDER — ALBUTEROL (5 MG/ML) CONTINUOUS INHALATION SOLN: 5.0000 mg/h | INHALATION_SOLUTION | RESPIRATORY_TRACT | Status: DC

## 2018-05-18 MED ORDER — BENZOCAINE-MENTHOL 20-0.5 % EX AERO: 1.0000 "application " | INHALATION_SPRAY | CUTANEOUS | Status: DC | PRN

## 2018-05-18 MED ORDER — IBUPROFEN 600 MG PO TABS
600.0000 mg | ORAL_TABLET | Freq: Four times a day (QID) | ORAL | Status: DC
  Administered 2018-05-18 – 2018-05-20 (×8): 600 mg via ORAL
  Filled 2018-05-18 (×8): qty 1

## 2018-05-18 MED ORDER — GENTAMICIN SULFATE 40 MG/ML IJ SOLN
5.0000 mg/kg | Freq: Once | INTRAVENOUS | Status: DC
  Filled 2018-05-18: qty 10

## 2018-05-18 MED ORDER — SENNOSIDES-DOCUSATE SODIUM 8.6-50 MG PO TABS
2.0000 | ORAL_TABLET | ORAL | Status: DC
  Administered 2018-05-18 – 2018-05-20 (×2): 2 via ORAL
  Filled 2018-05-18 (×2): qty 2

## 2018-05-18 MED ORDER — ALBUTEROL SULFATE (2.5 MG/3ML) 0.083% IN NEBU
5.0000 mg | INHALATION_SOLUTION | Freq: Four times a day (QID) | RESPIRATORY_TRACT | Status: DC | PRN
  Administered 2018-05-18: 5 mg via RESPIRATORY_TRACT
  Filled 2018-05-18: qty 6

## 2018-05-18 MED ORDER — WITCH HAZEL-GLYCERIN EX PADS: 1.0000 "application " | MEDICATED_PAD | CUTANEOUS | Status: DC | PRN

## 2018-05-18 MED ORDER — COCONUT OIL OIL
1.0000 "application " | TOPICAL_OIL | Status: DC | PRN
  Administered 2018-05-19: 1 via TOPICAL
  Filled 2018-05-18: qty 120

## 2018-05-18 MED ORDER — ALBUTEROL SULFATE (2.5 MG/3ML) 0.083% IN NEBU: 5.0000 mg | INHALATION_SOLUTION | Freq: Four times a day (QID) | RESPIRATORY_TRACT | Status: DC

## 2018-05-18 MED ORDER — ACETAMINOPHEN 325 MG PO TABS
650.0000 mg | ORAL_TABLET | ORAL | Status: DC | PRN
  Administered 2018-05-19 – 2018-05-20 (×2): 650 mg via ORAL
  Filled 2018-05-18 (×2): qty 2

## 2018-05-18 MED ORDER — DIBUCAINE 1 % RE OINT: 1.0000 "application " | TOPICAL_OINTMENT | RECTAL | Status: DC | PRN

## 2018-05-18 MED ORDER — SODIUM CHLORIDE 0.9 % IV SOLN
1.0000 g | Freq: Four times a day (QID) | INTRAVENOUS | Status: DC
  Filled 2018-05-18 (×3): qty 1000

## 2018-05-18 MED ORDER — DIPHENHYDRAMINE HCL 25 MG PO CAPS
25.0000 mg | ORAL_CAPSULE | Freq: Four times a day (QID) | ORAL | Status: DC | PRN
  Administered 2018-05-18: 25 mg via ORAL
  Filled 2018-05-18: qty 1

## 2018-05-18 MED ORDER — GENTAMICIN SULFATE 40 MG/ML IJ SOLN
5.0000 mg/kg | INTRAVENOUS | Status: DC
  Administered 2018-05-18: 340 mg via INTRAVENOUS
  Filled 2018-05-18: qty 8.5

## 2018-05-18 MED ORDER — SIMETHICONE 80 MG PO CHEW
80.0000 mg | CHEWABLE_TABLET | ORAL | Status: DC | PRN
  Administered 2018-05-19: 80 mg via ORAL
  Filled 2018-05-18: qty 1

## 2018-05-18 MED ORDER — SODIUM CHLORIDE 0.9 % IV SOLN
2.0000 g | Freq: Four times a day (QID) | INTRAVENOUS | Status: DC
  Administered 2018-05-18: 2 g via INTRAVENOUS
  Filled 2018-05-18: qty 2000
  Filled 2018-05-18: qty 2

## 2018-05-18 MED ORDER — TETANUS-DIPHTH-ACELL PERTUSSIS 5-2.5-18.5 LF-MCG/0.5 IM SUSP: 0.5000 mL | Freq: Once | INTRAMUSCULAR | Status: DC

## 2018-05-18 MED ORDER — ZOLPIDEM TARTRATE 5 MG PO TABS: 5.0000 mg | ORAL_TABLET | Freq: Every evening | ORAL | Status: DC | PRN

## 2018-05-18 MED ORDER — LIDOCAINE HCL (PF) 1 % IJ SOLN
INTRAMUSCULAR | Status: DC | PRN
  Administered 2018-05-18: 13 mL via EPIDURAL

## 2018-05-18 MED ORDER — OXYCODONE-ACETAMINOPHEN 5-325 MG PO TABS
2.0000 | ORAL_TABLET | ORAL | Status: DC | PRN
  Administered 2018-05-18 – 2018-05-19 (×2): 2 via ORAL
  Filled 2018-05-18 (×3): qty 2

## 2018-05-18 MED ORDER — ALBUTEROL (5 MG/ML) CONTINUOUS INHALATION SOLN
5.0000 mg/h | INHALATION_SOLUTION | RESPIRATORY_TRACT | Status: DC
  Filled 2018-05-18: qty 20

## 2018-05-18 NOTE — Anesthesia Preprocedure Evaluation (Signed)
Anesthesia Evaluation  Patient identified by MRN, date of birth, ID band Patient awake    Reviewed: Allergy & Precautions, NPO status , Patient's Chart, lab work & pertinent test results  Airway Mallampati: II  TM Distance: >3 FB Neck ROM: Full    Dental no notable dental hx. (+) Dental Advisory Given   Pulmonary asthma , former smoker,    Pulmonary exam normal breath sounds clear to auscultation       Cardiovascular negative cardio ROS Normal cardiovascular exam Rhythm:Regular Rate:Normal     Neuro/Psych negative neurological ROS  negative psych ROS   GI/Hepatic Neg liver ROS, IBS   Endo/Other  negative endocrine ROS  Renal/GU negative Renal ROS  negative genitourinary   Musculoskeletal negative musculoskeletal ROS (+)   Abdominal   Peds negative pediatric ROS (+)  Hematology  (+) Sickle cell trait ,   Anesthesia Other Findings   Reproductive/Obstetrics (+) Pregnancy                             Anesthesia Physical  Anesthesia Plan  ASA: II  Anesthesia Plan: Epidural   Post-op Pain Management:    Induction:   PONV Risk Score and Plan:   Airway Management Planned: Natural Airway  Additional Equipment:   Intra-op Plan:   Post-operative Plan:   Informed Consent: I have reviewed the patients History and Physical, chart, labs and discussed the procedure including the risks, benefits and alternatives for the proposed anesthesia with the patient or authorized representative who has indicated his/her understanding and acceptance.     Plan Discussed with:   Anesthesia Plan Comments: (Labs reviewed. Platelets acceptable, patient not taking any blood thinning medications. Risks and benefits discussed with patient, patient expressed understanding and wished to proceed.)        Anesthesia Quick Evaluation

## 2018-05-18 NOTE — Anesthesia Procedure Notes (Signed)
Epidural Patient location during procedure: OB Start time: 05/18/2018 12:29 AM End time: 05/18/2018 12:42 AM  Staffing Anesthesiologist: Lowella Curb, MD Performed: anesthesiologist   Preanesthetic Checklist Completed: patient identified, site marked, surgical consent, pre-op evaluation, timeout performed, IV checked, risks and benefits discussed and monitors and equipment checked  Epidural Patient position: sitting Prep: ChloraPrep Patient monitoring: heart rate, cardiac monitor, continuous pulse ox and blood pressure Approach: midline Location: L2-L3 Injection technique: LOR saline  Needle:  Needle type: Tuohy  Needle gauge: 17 G Needle length: 9 cm Needle insertion depth: 5 cm Catheter type: closed end flexible Catheter size: 20 Guage Catheter at skin depth: 9 cm Test dose: negative  Assessment Events: blood not aspirated, injection not painful, no injection resistance, negative IV test and no paresthesia  Additional Notes Reason for block:procedure for pain

## 2018-05-18 NOTE — Lactation Note (Signed)
This note was copied from a baby's chart. Lactation Consultation Note  Patient Name: Audrey Pearson [id number] Date: 05/18/2018 Reason for consult: Initial assessment;Term  6 hours old FT female who is still being exclusively BF by her mother, she's a P2 but not very experienced BF. She tried BF her first child for about a week but experienced BF difficulties due to a difficult latch; she exclusively pumped and bottle for 3 months. Mom participated in the [facility] program at the [facility] and she's already familiar with hand expression and able to get some drops of colostrum already.   Mom's nipples are everted, but she voiced to [facility] that baby wouldn't latch in L & D and she requested a NS, she was given a # 24, LC has yet to try it with baby's feeding, she was asleep and mom didn't wish to wake her up at this time. Asked mom to call for latch assistance when needed.   Explained to mom that baby is only 6 hours old and that it may be too early to tell if she does or does not need a NS; discussed normal newborn behavior and cluster feeding. LC also let mom know about hospital policy of double pumping while using a NS in order to protect her milk supply. Mom will let her RN know tomorrow if she'll continue using the shield or not in order to be set up with a DEBP, per mom baby has fed twice already without the shield.  Feeding plan:  1. Encouraged mom to feed baby on cues 8-12 times/24 hours or sooner if feeding cues are present; NS # 24 to be used PRN 2. Hand expression and spoon feeding were also encouraged  BF brochure, BF resources and feeding diary were reviewed. Mom reported all questions and concerns were answered, she's aware of LC services and will call PRN.  Maternal Data Formula Feeding for Exclusion: Yes Reason for exclusion: Mother's choice to formula and breast feed on admission Has patient been taught Hand Expression?: Yes Does the patient have breastfeeding experience prior to this delivery?:  Yes  Feeding     Interventions Interventions: Breast feeding basics reviewed  Lactation Tools Discussed/Used Tools: Nipple Shields(Mom requested a NS ) Nipple shield size: 24 WIC Program: Yes   Consult Status Consult Status: Follow-up Date: 05/19/18 Follow-up type: In-patient    Audrey Pearson Venetia Constable 05/18/2018, 10:37 PM

## 2018-05-18 NOTE — Progress Notes (Addendum)
Audrey Pearson is a 28 y.o. G3P1011 at [redacted]w[redacted]d admitted for onset of labor  Subjective: Doing well no complaints  Objective: BP 109/74 (BP Location: Left Arm)   Pulse 99   Temp 98.2 F (36.8 C) (Oral)   Resp 18   Ht 5\' 6"  (1.676 m)   Wt 80.3 kg   LMP 08/12/2017 (Exact Date)   SpO2 97%   BMI 28.57 kg/m  No intake/output data recorded. No intake/output data recorded.  FHT:  FHR: 135 bpm, variability: moderate,  accelerations:  Present,  decelerations:  Absent UC:   regular, every 3-4 minutes SVE:   Dilation: 3 Effacement (%): 60 Station: -2 Exam by:: [employer]: Lab Results  Component Value Date   WBC 13.6 (H) 05/17/2018   HGB 11.3 (L) 05/17/2018   HCT 35.0 (L) 05/17/2018   MCV 79.2 (L) 05/17/2018   PLT 253 05/17/2018    Assessment / Plan: spontaneous onset of labor, minimal progress since admission. Will start pitocin. Would get minimal benefit from foley bulb, contractions too frequent to start cytotec.  Labor: will start pitocin Preeclampsia:  none Fetal Wellbeing:  Category I Pain Control:  Epidural I/D:  n/a Anticipated MOD:  NSVD  Myrene Buddy 05/18/2018, 4:57 AM

## 2018-05-18 NOTE — Progress Notes (Signed)
ANTIBIOTIC CONSULT NOTE - INITIAL  Pharmacy Consult for Gentamicin Indication: Chorioamnionitis   No Known Allergies  Patient Measurements: Height: 5\' 6"  (167.6 cm) Weight: 177 lb (80.3 kg) IBW/kg (Calculated) : 59.3 Adjusted Body Weight: 65.6 kg  Vital Signs: Temp: 100 F (37.8 C) (01/05 1435) Temp Source: Oral (01/05 1435) BP: 131/71 (01/05 1431) Pulse Rate: 96 (01/05 1431)  Labs: Recent Labs    05/17/18 2038  WBC 13.6*  HGB 11.3*  PLT 253   No results for input(s): GENTTROUGH, GENTPEAK, GENTRANDOM in the last 72 hours.   Microbiology: Recent Results (from the past 720 hour(s))  Culture, beta strep (group b only)     Status: None   Collection Time: 04/24/18  2:36 PM  Result Value Ref Range Status   Strep Gp B Culture Negative Negative Final    Comment: Centers for Disease Control and Prevention (CDC) and American Congress of Obstetricians and Gynecologists (ACOG) guidelines for prevention of perinatal group B streptococcal (GBS) disease specify co-collection of a vaginal and rectal swab specimen to maximize sensitivity of GBS detection. Per the CDC and ACOG, swabbing both the lower vagina and rectum substantially increases the yield of detection compared with sampling the vagina alone. Penicillin G, ampicillin, or cefazolin are indicated for intrapartum prophylaxis of perinatal GBS colonization. Reflex susceptibility testing should be performed prior to use of clindamycin only on GBS isolates from penicillin-allergic women who are considered a high risk for anaphylaxis. Treatment with vancomycin without additional testing is warranted if resistance to clindamycin is noted.     Medications:  Ampicillin 2 g IV q6h Gentamicin 5 mg/kg IV x1  Assessment: 28 y.o. female G3P1011 at [redacted]w[redacted]d with chorioamnionitis  Goal of Therapy:  Gentamicin peak 6-8 mg/L and Trough < 1 mg/L  Plan:  Gentamicin 5 mg IV every 24 hrs  Check Scr with next labs if gentamicin  continued. Will check gentamicin levels if continued > 72hr or clinically indicated.  Loletta Specter Lynnell Chad 05/18/2018,3:02 PM

## 2018-05-18 NOTE — Progress Notes (Signed)
SAMAYA BYL is a 28 y.o. G3P1011 at [redacted]w[redacted]d admitted for early labor   Subjective:  No complaints. Mostly comfortable with epidural. Feeling some pressure.  Objective: BP (!) 104/54   Pulse 100   Temp 99 F (37.2 C) (Axillary)   Resp 16   Ht 5\' 6"  (1.676 m)   Wt 80.3 kg   LMP 08/12/2017 (Exact Date)   SpO2 100%   BMI 28.57 kg/m  No intake/output data recorded. No intake/output data recorded.  FHT:  FHR: 150 bpm, variability: moderate,  accelerations:  Present,  decelerations:  Absent UC:   regular, every 2-3 minutes SVE:   Dilation: 7 Effacement (%): 70 Station: -1 Exam by:: [employer]: Lab Results  Component Value Date   WBC 13.6 (H) 05/17/2018   HGB 11.3 (L) 05/17/2018   HCT 35.0 (L) 05/17/2018   MCV 79.2 (L) 05/17/2018   PLT 253 05/17/2018    Assessment / Plan: Augmentation of labor, progressing well  Labor: Progressing normally Preeclampsia:  NA Fetal Wellbeing:  Category I Pain Control:  Epidural I/D:  n/a Anticipated MOD:  NSVD  Thressa Sheller DNP, CNM  05/18/18  1:55 PM

## 2018-05-18 NOTE — Plan of Care (Signed)
POC reviewed with patient, understanding verbalized.

## 2018-05-19 LAB — CBC
HCT: 28.7 % — ABNORMAL LOW (ref 36.0–46.0)
Hemoglobin: 9.7 g/dL — ABNORMAL LOW (ref 12.0–15.0)
MCH: 26.6 pg (ref 26.0–34.0)
MCHC: 33.8 g/dL (ref 30.0–36.0)
MCV: 78.8 fL — ABNORMAL LOW (ref 80.0–100.0)
Platelets: 196 10*3/uL (ref 150–400)
RBC: 3.64 MIL/uL — ABNORMAL LOW (ref 3.87–5.11)
RDW: 16.1 % — ABNORMAL HIGH (ref 11.5–15.5)
WBC: 22 10*3/uL — ABNORMAL HIGH (ref 4.0–10.5)
nRBC: 0 % (ref 0.0–0.2)

## 2018-05-19 NOTE — Lactation Note (Signed)
This note was copied from a baby's chart. Lactation Consultation Note  Patient Name: Girl Yvette Rackngel Ruvalcaba ZOXWR'UToday's Date: 05/19/2018 Reason for consult: Follow-up assessment;Term  P2 mother whose infant is now 5318 hours old.    RN request for latch assistance.  Mother was holding baby on her chest when I arrived.  Baby was sleeping.  Informed mother that her RN had requested I check in with her regarding baby's tight latch.  Mother stated that when baby latches it is sore.  Since baby was sleeping I suggested mother let me know when baby shows feeding cues and I will return to assist with latching.  Mother agreed to this plan.  Mother is unhappy about her RN care today and her LC care last night.  She stated that "no one is listening to me."  She requested a different RN for today's shift.  I will attempt to alleviate her concerns when I return for latch assistance.  Suggested she try to call me as soon as possible when baby awakens since I will most likely be helping other couplets.  Mother verbalized understanding.  RN updated.   Maternal Data Formula Feeding for Exclusion: No Has patient been taught Hand Expression?: Yes Does the patient have breastfeeding experience prior to this delivery?: No(Mother pumped and bottle fed for 3 months)  Feeding    LATCH Score                   Interventions    Lactation Tools Discussed/Used WIC Program: Yes   Consult Status Consult Status: Follow-up Date: 05/20/18 Follow-up type: In-patient    Arnella Pralle R Kaveon Blatz 05/19/2018, 11:28 AM

## 2018-05-19 NOTE — Lactation Note (Signed)
This note was copied from a baby's chart. Lactation Consultation Note  Patient Name: Audrey Pearson [id number] Date: 05/19/2018 Reason for consult: Follow-up assessment;Difficult latch;Term  P2 mother whose infant is now 10 hours old.  Mother did not breast feed her first child but pumped and bottle fed for 3 months.  RN called and requested latch assistance.  Mother and RN had attempted to latch baby to breast but mother continues to have pain with latching.  Her breasts are soft and non tender and nipples are everted.  Mother states her nipples are sore and she feels pain when baby latches.  She also has a difficult time getting baby to open her mouth wide for latching.  Baby was not showing any feeding cues when I arrived.  Mother stated she was showing cues prior to the RN coming into her room.  Upon oral assessment, baby had a very tight suck and held tongue to upper palate.  Performed suck training with some relaxation.  Mother did some hand expression and was able to obtain some large colostrum drops from the right breast.  I demonstrated finger feeding for mother to observe.    Assisted to latch in the football hold on the right breast.  It took a few minutes for baby to open wide.  Educated mother on the importance of obtaining a wide gape prior to latching.  Once latched baby would only suck 1-2 times and mother complained of pain.  Tried gently pulling down her chin without success.  Suggested mother use the NS that she has at bedside.  #24 NS positioned appropriately and assisted baby to latch again.  She was able to latch but still did not want to suck.  Gentle stimulation did not encourage her to suck more than 5-6 times before holding the nipple in her mouth.  Swaddled baby and placed her in the bassinet where she fell asleep.  Suggested mother begin pumping with the DEBP.  Mother willing to pump.  Pump parts, assembly, disassembly and cleaning reviewed.  Changed flange size from a #24 to  a #27 for greater comfort.  Informed her of the use of coconut oil for lubrication to flanges.  Mother will feed back any EBM she obtains to baby.  She will call me for latch assistance again when baby shows cues.  I will return to provided more education and assistance.  RN updated.    Maternal Data Formula Feeding for Exclusion: No Has patient been taught Hand Expression?: Yes Does the patient have breastfeeding experience prior to this delivery?: No(pumped and bottle fed for 3 months)  Feeding Feeding Type: Breast Fed  LATCH Score Latch: Too sleepy or reluctant, no latch achieved, no sucking elicited.  Audible Swallowing: None  Type of Nipple: Everted at rest and after stimulation  Comfort (Breast/Nipple): Filling, red/small blisters or bruises, mild/mod discomfort  Hold (Positioning): Assistance needed to correctly position infant at breast and maintain latch.  LATCH Score: 4  Interventions Interventions: Breast feeding basics reviewed;Assisted with latch;Skin to skin;Breast massage;Hand express;Breast compression;Coconut oil;Expressed milk;Position options;Support pillows;Adjust position;DEBP  Lactation Tools Discussed/Used Tools: Pump;Flanges;Nipple Shields Nipple shield size: 24 Flange Size: 27 Breast pump type: Double-Electric Breast Pump WIC Program: Yes Pump Review: Setup, frequency, and cleaning;Milk Storage Initiated by:: Krishna Heuer Date initiated:: 05/19/18   Consult Status Consult Status: Follow-up Date: 05/20/18 Follow-up type: In-patient    Quantavia Frith R Abbey Veith 05/19/2018, 2:27 PM

## 2018-05-19 NOTE — Progress Notes (Addendum)
Post Partum Day 1 Subjective: no complaints, up ad lib, voiding, tolerating PO and + flatus  Objective: Blood pressure (!) 107/58, pulse 71, temperature 97.6 F (36.4 C), temperature source Axillary, resp. rate 16, height 5\' 6"  (1.676 m), weight 80.3 kg, last menstrual period 08/12/2017, SpO2 99 %, unknown if currently breastfeeding.  Physical Exam:  General: alert, cooperative and appears stated age Lochia: appropriate Uterine Fundus: firm Incision: healing well, no significant drainage, no dehiscence DVT Evaluation: No evidence of DVT seen on physical exam. Negative Homan's sign. No cords or calf tenderness.  Recent Labs    05/17/18 2038 05/19/18 0527  HGB 11.3* 9.7*  HCT 35.0* 28.7*    Assessment/Plan: Plan for discharge tomorrow, Breastfeeding, Lactation consult and Contraception nexplanon   LOS: 2 days   Myrene Buddy 05/19/2018, 7:40 AM   I confirm that I have verified the information documented in the resident's note and that I have also personally reperformed the physical exam and all medical decision making activities.  Patient doing well. Plans for discharge tomorrow. Reports difficulty latching, which occurred with last child as well. Educated on the use of lactation consultants while in hospital- patient plans to mainly pump while home.   Sharyon Cable, CNM 05/19/18, 11:05 AM

## 2018-05-19 NOTE — Anesthesia Postprocedure Evaluation (Signed)
Anesthesia Post Note  Patient: Audrey Pearson  Procedure(s) Performed: AN AD HOC LABOR EPIDURAL     Patient location during evaluation: Mother Baby Anesthesia Type: Epidural Level of consciousness: awake and alert Pain management: pain level controlled Vital Signs Assessment: post-procedure vital signs reviewed and stable Respiratory status: spontaneous breathing, nonlabored ventilation and respiratory function stable Cardiovascular status: stable Postop Assessment: no headache and epidural receding Anesthetic complications: no    Last Vitals:  Vitals:   05/19/18 0320 05/19/18 0653  BP: 120/74 (!) 107/58  Pulse: 71 71  Resp: 18 16  Temp: 36.5 C 36.4 C  SpO2:  99%    Last Pain:  Vitals:   05/19/18 0820  TempSrc:   PainSc: 3    Pain Goal: Patients Stated Pain Goal: 2 (05/18/18 0000)               Marrion Coy
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
## 2018-05-20 MED ORDER — PNEUMOCOCCAL VAC POLYVALENT 25 MCG/0.5ML IJ INJ
0.5000 mL | INJECTION | Freq: Once | INTRAMUSCULAR | Status: AC
  Administered 2018-05-20: 0.5 mL via INTRAMUSCULAR
  Filled 2018-05-20: qty 0.5

## 2018-05-20 MED ORDER — PNEUMOCOCCAL VAC POLYVALENT 25 MCG/0.5ML IJ INJ: 0.5000 mL | INJECTION | INTRAMUSCULAR | Status: DC

## 2018-05-20 MED ORDER — IBUPROFEN 600 MG PO TABS: 600.0000 mg | ORAL_TABLET | Freq: Four times a day (QID) | ORAL | 1 refills | Status: DC

## 2018-05-20 MED ORDER — IBUPROFEN 600 MG PO TABS: 600.0000 mg | ORAL_TABLET | Freq: Four times a day (QID) | ORAL | 0 refills | Status: DC

## 2018-05-20 NOTE — Discharge Summary (Addendum)
OB Discharge Summary     Patient Name: Audrey Pearson DOB: 08/08/1990 MRN: [id number]  Date of admission: 05/17/2018 Delivering MD: Peggyann ShoalsANDERSON, HANNAH C   Date of discharge: 05/20/2018  Admitting diagnosis: 39 WKS, LABOR CHECK Intrauterine pregnancy: 3424w3d     Secondary diagnosis:  Active Problems:   Indication for care in labor or delivery  Additional problems:  Mild intermittent asthma Sickle Cell trait IBS w/ constipation Elevated fasting glucose     Discharge diagnosis: Term Pregnancy Delivered                                                                                                Post partum procedures:none  Augmentation: Pitocin  Complications: Intrauterine Inflammation or infection (Chorioamniotis), [facility]  Hospital course:  Onset of Labor With Vaginal Delivery     28 y.o. yo [id number] at 4224w3d was admitted in Active Labor on 05/17/2018. Patient had an uncomplicated labor course as follows: admitted on 05/17/2017 for spontaneous onset of labor. She was initially managed expectantly. Pitocin was started around 6 hours after admission. She progressed rapidly until delivery on 1/4. She developed a temperature up to 102, she was started on amp and gent for triple I. Her fevers stopped after delivery. Her postpartum course was routine with no issues.   Membrane Rupture Time/Date: 4:31 PM ,05/18/2018   Intrapartum Procedures: Episiotomy: None [1]                                         Lacerations:  None [1]  Patient had a delivery of a Viable infant. 05/18/2018  Information for the patient's newborn:  Michaelle CopasCole, Girl Merlie [[id number]][[id number]]      Pateint had an uncomplicated postpartum course.  She is ambulating, tolerating a regular diet, passing flatus, and urinating well. Patient is discharged home in stable condition on 05/20/18.   Physical exam  Vitals:   05/19/18 0653 05/19/18 1829 05/19/18 2242 05/20/18 0543  BP: (!) 107/58 110/76 107/61 102/62  Pulse: 71 71 71  76  Resp: 16 17 16 16   Temp: 97.6 F (36.4 C) 98 F (36.7 C) 97.7 F (36.5 C) 97.6 F (36.4 C)  TempSrc: Axillary Axillary Oral Oral  SpO2: 99%  100%   Weight:      Height:       General: alert, cooperative and no distress Lochia: appropriate Uterine Fundus: firm Incision: N/A DVT Evaluation: No evidence of DVT seen on physical exam. Negative Homan's sign. No cords or calf tenderness. Labs: Lab Results  Component Value Date   WBC 22.0 (H) 05/19/2018   HGB 9.7 (L) 05/19/2018   HCT 28.7 (L) 05/19/2018   MCV 78.8 (L) 05/19/2018   PLT 196 05/19/2018   CMP Latest Ref Rng & Units 10/31/2017  Glucose 65 - 99 mg/dL 91  BUN 6 - 20 mg/dL 7  Creatinine 4.690.44 - 6.291.00 mg/dL 5.280.45  Sodium 413135 - 244145 mmol/L 134(L)  Potassium 3.5 - 5.1 mmol/L 3.8  Chloride 101 -  111 mmol/L 104  CO2 22 - 32 mmol/L 21(L)  Calcium 8.9 - 10.3 mg/dL 9.1  Total Protein 6.5 - 8.1 g/dL 6.4(L)  Total Bilirubin 0.3 - 1.2 mg/dL 0.4  Alkaline Phos 38 - 126 U/L 41  AST 15 - 41 U/L 14(L)  ALT 14 - 54 U/L 11(L)    Discharge instruction: per After Visit Summary and "Baby and Me Booklet".  After visit meds:  Allergies as of 05/20/2018   No Known Allergies     Medication List    STOP taking these medications   bisacodyl 10 MG suppository Commonly known as:  DULCOLAX   polyethylene glycol packet Commonly known as:  MIRALAX   prochlorperazine 10 MG tablet Commonly known as:  COMPAZINE   scopolamine 1 MG/3DAYS Commonly known as:  TRANSDERM-SCOP     TAKE these medications   acetaminophen 500 MG tablet Commonly known as:  TYLENOL Take 1,000 mg by mouth every 6 (six) hours as needed for headache.   albuterol 108 (90 Base) MCG/ACT inhaler Commonly known as:  PROVENTIL HFA;VENTOLIN HFA Inhale 2 puffs into the lungs every 4 (four) hours as needed for wheezing or shortness of breath.   cyclobenzaprine 10 MG tablet Commonly known as:  FLEXERIL Take 1 tablet (10 mg total) by mouth every 8 (eight) hours as  needed for muscle spasms.   ibuprofen 600 MG tablet Commonly known as:  ADVIL,MOTRIN Take 1 tablet (600 mg total) by mouth every 6 (six) hours.       Diet: routine diet  Activity: Advance as tolerated. Pelvic rest for 6 weeks.   Outpatient follow up:4 weeks Follow up Appt:No future appointments. Follow up Visit:No follow-ups on file.  Postpartum contraception: Nexplanon  Newborn Data: Live born female  Birth Weight: 6 lb 15.6 oz (3165 g) APGAR: 9, 9  Newborn Delivery   Birth date/time:  05/18/2018 16:31:00 Delivery type:  Vaginal, Spontaneous     Baby Feeding: Bottle and Breast Disposition:home with mother   05/20/2018 Myrene BuddyJacob Fletcher, MD  OB FELLOW DISCHARGE ATTESTATION  I have seen and examined this patient and agree with above documentation in the resident's note.   Marcy Sirenatherine Dash Cardarelli, D.O. OB Fellow  05/20/2018, 10:05 AM

## 2018-05-20 NOTE — Lactation Note (Signed)
This note was copied from a baby's chart. Lactation Consultation Note: Mom reports she has been giving bottles of formula because her nipples are sore. Offered assist with latch but mom does not want to latch baby at this time. Asking about DEBP for home. Has WIC but signed up for formula and they will not give her pump. I showed her how to use pump pieces as manual pump at home. Also has Harmony manual pump in room - reviewed setup, use and cleaning of pump pieces. Mom pumping with DEBP as I left room. Reports #27 flanges are hurting. I gave her #30 flanges and she reports that feels better. No questions at present. Reviewed our phone number, OP appointments and [facility] as resources for support after DC. To call prn  Patient Name: Audrey Pearson LTRVU'Y Date: 05/20/2018 Reason for consult: Follow-up assessment   Maternal Data Formula Feeding for Exclusion: Yes Reason for exclusion: Mother's choice to formula and breast feed on admission Has patient been taught Hand Expression?: Yes Does the patient have breastfeeding experience prior to this delivery?: Yes  Feeding Feeding Type: Bottle Fed - Formula Nipple Type: Slow - flow  LATCH Score                   Interventions    Lactation Tools Discussed/Used     Consult Status Consult Status: Complete    Pamelia Hoit 05/20/2018, 11:18 AM

## 2018-05-21 ENCOUNTER — Telehealth (HOSPITAL_COMMUNITY): Payer: Self-pay

## 2018-05-21 ENCOUNTER — Encounter: Payer: Self-pay | Admitting: Obstetrics and Gynecology

## 2018-05-28 ENCOUNTER — Encounter: Payer: Self-pay | Admitting: Obstetrics and Gynecology

## 2018-06-18 ENCOUNTER — Encounter (HOSPITAL_COMMUNITY): Payer: Self-pay | Admitting: Emergency Medicine

## 2018-06-18 ENCOUNTER — Emergency Department (HOSPITAL_COMMUNITY): Payer: Medicaid Other

## 2018-06-18 ENCOUNTER — Emergency Department (HOSPITAL_COMMUNITY)
Admission: EM | Admit: 2018-06-18 | Discharge: 2018-06-18 | Disposition: A | Discharge status: Home / Self Care | Payer: Medicaid Other | Attending: Emergency Medicine | Admitting: Emergency Medicine

## 2018-06-18 DIAGNOSIS — R509 Fever, unspecified: Secondary | ICD-10-CM | POA: Diagnosis present

## 2018-06-18 DIAGNOSIS — Z87891 Personal history of nicotine dependence: Secondary | ICD-10-CM | POA: Diagnosis not present

## 2018-06-18 DIAGNOSIS — J45909 Unspecified asthma, uncomplicated: Secondary | ICD-10-CM | POA: Insufficient documentation

## 2018-06-18 DIAGNOSIS — J111 Influenza due to unidentified influenza virus with other respiratory manifestations: Secondary | ICD-10-CM | POA: Diagnosis not present

## 2018-06-18 MED ORDER — ACETAMINOPHEN 325 MG PO TABS
650.0000 mg | ORAL_TABLET | Freq: Once | ORAL | Status: AC | PRN
  Administered 2018-06-18: 650 mg via ORAL
  Filled 2018-06-18: qty 2

## 2018-06-18 MED ORDER — OSELTAMIVIR PHOSPHATE 75 MG PO CAPS: 75.0000 mg | ORAL_CAPSULE | Freq: Two times a day (BID) | ORAL | 0 refills | Status: DC

## 2018-06-18 NOTE — ED Triage Notes (Signed)
Patient reports fever as high as 103 at home with body aches, cough and ear pain x3 days. Recent vaginal delivery on 05/18/18. Patients daughter is also sick with same symptoms.

## 2018-06-18 NOTE — Discharge Instructions (Addendum)
Please read attached information. If you experience any new or worsening signs or symptoms please return to the emergency room for evaluation. Please follow-up with your primary care provider or specialist as discussed. Please use medication prescribed only as directed and discontinue taking if you have any concerning signs or symptoms.   °
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
## 2018-06-18 NOTE — ED Notes (Signed)
Patient transported to X-ray 

## 2018-06-23 ENCOUNTER — Ambulatory Visit (INDEPENDENT_AMBULATORY_CARE_PROVIDER_SITE_OTHER): Payer: Medicaid Other | Admitting: Nurse Practitioner

## 2018-06-23 ENCOUNTER — Encounter: Payer: Self-pay | Admitting: Nurse Practitioner

## 2018-06-23 ENCOUNTER — Ambulatory Visit (INDEPENDENT_AMBULATORY_CARE_PROVIDER_SITE_OTHER): Payer: Medicaid Other | Admitting: Clinical

## 2018-06-23 DIAGNOSIS — Z1389 Encounter for screening for other disorder: Secondary | ICD-10-CM

## 2018-06-23 DIAGNOSIS — Z30017 Encounter for initial prescription of implantable subdermal contraceptive: Secondary | ICD-10-CM

## 2018-06-23 DIAGNOSIS — F4321 Adjustment disorder with depressed mood: Secondary | ICD-10-CM

## 2018-06-23 DIAGNOSIS — Z975 Presence of (intrauterine) contraceptive device: Secondary | ICD-10-CM | POA: Insufficient documentation

## 2018-06-23 MED ORDER — SERTRALINE HCL 50 MG PO TABS: 50.0000 mg | ORAL_TABLET | Freq: Every day | ORAL | 0 refills | Status: DC

## 2018-06-23 MED ORDER — ETONOGESTREL 68 MG ~~LOC~~ IMPL
68.0000 mg | DRUG_IMPLANT | Freq: Once | SUBCUTANEOUS | Status: AC
  Administered 2018-06-23: 68 mg via SUBCUTANEOUS

## 2018-06-23 NOTE — Progress Notes (Addendum)
Subjective:     Audrey Pearson is a 28 y.o. female who presents for a postpartum visit. She is 5 weeks postpartum following a spontaneous vaginal delivery. I have fully reviewed the prenatal and intrapartum course. The delivery was at 39.3 gestational weeks. Outcome: spontaneous vaginal delivery. Anesthesia: epidural. Postpartum course has been unremarkable. Baby's course has been unremarkable. Baby is feeding by both breast and bottle - Lucien Mons Start . Bleeding thin lochia. Bowel function is normal. Bladder functio2n is normal. Patient is not sexually active. Contraception method is Nexplanon.- for insertion today.   Postpartum depression screening: negative.  Client diagnosed with flu on 06-18-18.  Her baby and all in the household had the flu.  She did not take the Tamiflu prescription that she got.  After the visit, client went to see [facility] provider requesting medication for postpartum depression - did not mention to her provider today at her visit.  Endinburgh score was low but client says she has no appetite and is not eating. Will try a trial of medication with a return visit in one week to [facility] provider.  Medication sent to her pharmacy.  The following portions of the patient's history were reviewed and updated as appropriate: allergies, past family history, past medical history, past social history, past surgical history and problem list.  Review of Systems Pertinent items noted in HPI and remainder of comprehensive ROS otherwise negative.   Objective:    BP 119/77   Pulse (!) 105   Wt 150 lb (68 kg)   LMP 08/12/2017 (Exact Date)   BMI 24.21 kg/m   General:  alert, cooperative, no distress and has a mask due to flu   Breasts:  not examined  Lungs: clear to auscultation bilaterally  Heart:  regular rate and rhythm, S1, S2 normal, no murmur, click, rub or gallop  Abdomen: soft, non-tender; bowel sounds normal; no masses,  no organomegaly      Pelvic deferred  Nexplanon  Insertion Patient identified, informed consent performed, consent signed.   Appropriate time out taken. Nexplanon insertion site identified.  Area prepped in usual sterile fashon. Three ml of 1% lidocaine was used to anesthetize the area.   Nexplanon inserted by company guidelines.  A pressure bandage was applied to reduce any bruising.  The patient tolerated the procedure well and was given post procedure instructions.  Patient is planning to use condoms for contraception/attempt conception.    Assessment:   Normal postpartum exam. Pap smear not done at today's visit.   Plan:    1. Contraception: Nexplanon  Return if you have any problems with vaginal bleeding with the nexplanon 2. Keep breastfeeding to help your baby heal from the flu. 3. Follow up in: 1 year or as needed.    Nolene Bernheim, RN, MSN, NP-BC Nurse Practitioner, [facility] for [employer], [facility] 06/23/2018 11:39 AM

## 2018-06-23 NOTE — Patient Instructions (Signed)
Condoms for 2 weeks for pregnancy protection.
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
## 2018-06-23 NOTE — Addendum Note (Signed)
Addended by: Currie Paris on: 06/23/2018 11:58 AM   Modules accepted: Orders

## 2018-06-24 LAB — POCT PREGNANCY, URINE: Preg Test, Ur: NEGATIVE

## 2018-06-30 ENCOUNTER — Telehealth: Payer: Self-pay | Admitting: Clinical

## 2018-06-30 NOTE — Telephone Encounter (Signed)
Integrated Behavioral Health Medication Management Phone Note  MRN: [id number] NAME: Audrey Pearson  Time Call Initiated: 4:54 Time Call Completed: 4:56 Total Call Time: 15 minutes  Current Medications:  Outpatient Medications Prior to Visit  Medication Sig Dispense Refill  . acetaminophen (TYLENOL) 500 MG tablet Take 1,000 mg by mouth every 6 (six) hours as needed for headache.    . albuterol (PROVENTIL HFA;VENTOLIN HFA) 108 (90 Base) MCG/ACT inhaler Inhale 2 puffs into the lungs every 4 (four) hours as needed for wheezing or shortness of breath.    . cyclobenzaprine (FLEXERIL) 10 MG tablet Take 1 tablet (10 mg total) by mouth every 8 (eight) hours as needed for muscle spasms. (Patient not taking: Reported on 06/23/2018) 30 tablet 1  . ibuprofen (ADVIL,MOTRIN) 600 MG tablet Take 1 tablet (600 mg total) by mouth every 6 (six) hours. 60 tablet 1  . oseltamivir (TAMIFLU) 75 MG capsule Take 1 capsule (75 mg total) by mouth every 12 (twelve) hours. (Patient not taking: Reported on 06/23/2018) 10 capsule 0  . sertraline (ZOLOFT) 50 MG tablet Take 1 tablet (50 mg total) by mouth daily. 30 tablet 0   No facility-administered medications prior to visit.     Patient has been able to get all medications filled as prescribed: Yes  Patient is currently taking all medications as prescribed: Yes; began taking immediately.   Patient reports experiencing side effects: No  Patient describes feeling this way on medications: "the same"  Additional patient concerns:  No additional concerns; pt no longer has flu  Patient advised to schedule appointment with provider for evaluation of medication side effects or additional concerns: Yes- Pt agrees to follow-up visit with [facility] on 07/08/2018 at 1:00   [facility], LCSW

## 2018-07-01 ENCOUNTER — Encounter: Payer: Self-pay | Admitting: *Deleted

## 2018-07-08 ENCOUNTER — Ambulatory Visit: Payer: Self-pay

## 2018-09-15 ENCOUNTER — Telehealth: Payer: Self-pay | Admitting: Clinical

## 2018-09-16 ENCOUNTER — Other Ambulatory Visit: Payer: Self-pay | Admitting: Family Medicine

## 2018-09-16 ENCOUNTER — Telehealth: Payer: Self-pay | Admitting: Clinical

## 2018-09-16 MED ORDER — SERTRALINE HCL 100 MG PO TABS: 100.0000 mg | ORAL_TABLET | Freq: Every day | ORAL | 2 refills | Status: DC

## 2018-09-16 NOTE — Telephone Encounter (Signed)
Error

## 2018-09-16 NOTE — Telephone Encounter (Signed)
Pt is informed that Dr Shawnie Pons has increased her medication and has put in refills; pt agrees to [facility] video visit on Tuesday, May 12, at 1:30pm with Behavioral Health Clinician at [facility]. Pt is aware a MyChart message will be sent with instructions for accessing Webex.

## 2018-09-23 ENCOUNTER — Ambulatory Visit (INDEPENDENT_AMBULATORY_CARE_PROVIDER_SITE_OTHER): Payer: Medicaid Other | Admitting: Clinical

## 2018-09-23 ENCOUNTER — Other Ambulatory Visit: Payer: Self-pay

## 2018-09-23 DIAGNOSIS — F4323 Adjustment disorder with mixed anxiety and depressed mood: Secondary | ICD-10-CM | POA: Diagnosis not present

## 2018-09-23 NOTE — BH Specialist Note (Signed)
Integrated Behavioral Health Follow Up Visit  MRN: [id number] Name: Audrey Pearson  Number of Integrated Behavioral Health Clinician visits: 3/6 4 total Session Start time: 1:35  Session End time: 2:17 Total time: 40 minutes  Type of Service: Integrated Behavioral Health- Individual/Family Interpretor:No. Interpretor Name and Language: n/a  SUBJECTIVE: Audrey Pearson is a 28 y.o. female accompanied by daughters in room, via video Patient was referred by Nolene Bernheim, NP  for depression postpartum. Patient reports the following symptoms/concerns: Pt reports her primary symptoms as fatigue, lack of appetite, and irritability. Since beginning increase of Zoloft, she is not feeling "blank" as previous; is feeling more focused, and hand shakiness.  Pt is very open to learning self-coping strategy, to use alongside medication, as shakiness increases during moments of stress the past week.  Duration of problem: Postpartum; Severity of problem: moderate  OBJECTIVE: Mood: Anxious, Depressed and Irritable and Affect: Appropriate Risk of harm to self or others: No plan to harm self or others  LIFE CONTEXT: Family and Social: Pt lives with her children (2yo; 10mo daughters), her friend and friend's mother School/Work: - Self-Care: Recognizing a greater need for self-care; daily brief walks with dog Life Changes: Postpartum  GOALS ADDRESSED: Patient will: 1.  Reduce symptoms of: anxiety, depression and stress  2.  Increase knowledge and/or ability of: self-management skills and stress reduction  3.  Demonstrate ability to: Increase healthy adjustment to current life circumstances  INTERVENTIONS: Interventions utilized:  Mindfulness or Relaxation Training and Medication Monitoring Standardized Assessments completed: GAD-7 and PHQ 9  ASSESSMENT: Patient currently experiencing Adjustment disorder with mixed anxious and depressed mood  Patient may benefit from continued brief therapeutic  interventions regarding coping with symptoms of anxiety and depression.  PLAN: 1. Follow up with behavioral health clinician on : One week 2. Behavioral recommendations:  -Continue taking medication as prescribed -Continue search for PCP -Continue daily walks with dog -Begin implementing CALM relaxation breathing exercise, at least twice daily (morning; at bedtime) -Continue with plans to meet grandmother for the first time (consider writing, as discussed, prior to meeting) 3. Referral(s): Integrated Behavioral Health Services (In Clinic) 4. "From scale of 1-10, how likely are you to follow plan?": 8  Rae Lips, LCSW   Depression screen [facility] 2/9 09/23/2018 05/16/2018 05/08/2018 04/30/2018 04/24/2018  Decreased Interest 1 1 1  0 0  Down, Depressed, Hopeless 1 0 0 0 1  PHQ - 2 Score 2 1 1  0 1  Altered sleeping 2 3 3 3 1   Tired, decreased energy 3 3 3 3 1   Change in appetite 3 0 0 0 3  Feeling bad or failure about yourself  1 0 0 0 1  Trouble concentrating 1 0 0 0 0  Moving slowly or fidgety/restless 1 0 0 0 0  Suicidal thoughts 0 0 0 0 0  PHQ-9 Score 13 7 7 6 7   Some recent data might be hidden   GAD 7 : Generalized Anxiety Score 09/23/2018 05/16/2018 05/08/2018 04/30/2018  Nervous, Anxious, on Edge 2 0 - 1  Control/stop worrying 1 0 0 0  Worry too much - different things 2 0 0 0  Trouble relaxing 1 0 0 2  Restless 2 0 0 0  Easily annoyed or irritable 3 3 3 3   Afraid - awful might happen 2 0 0 0  Total GAD 7 Score 13 3 - 6

## 2018-09-25 ENCOUNTER — Telehealth: Payer: Self-pay | Admitting: Clinical

## 2018-09-25 NOTE — Telephone Encounter (Signed)
Per Dr. Tinnie Gens, patient is informed she may either continue to take Zoloft as prescribed at 100mg , OR she may reduce to 75mg , to see if her "hand shakiness" symptom goes away.   Pt says she would like to continue taking Zoloft as prescribed until at least her upcoming appointment with [facility] on 09/30/2018, and will think about reducing dosage at that time, if the side effect of shakiness does not decrease or go away by that visit.

## 2018-09-30 ENCOUNTER — Telehealth: Payer: Self-pay | Admitting: Student

## 2018-09-30 NOTE — BH Specialist Note (Deleted)
Attempt to follow-up via Webex Video; pt did not arrive to video visit, and did not answer the phone; Left HIPPA-compliant message to call back Asher Muir from [facility] [employer] at [phone], or front office at [phone]. MyChart message also left for patient.    Integrated Behavioral Health Follow Up Visit  MRN: [id number] Name: Audrey Pearson [facility], LCSW

## 2018-10-01 ENCOUNTER — Ambulatory Visit (INDEPENDENT_AMBULATORY_CARE_PROVIDER_SITE_OTHER): Payer: Medicaid Other | Admitting: Clinical

## 2018-10-01 DIAGNOSIS — F4323 Adjustment disorder with mixed anxiety and depressed mood: Secondary | ICD-10-CM | POA: Diagnosis present

## 2018-10-01 NOTE — BH Specialist Note (Signed)
Integrated Behavioral Health Visit via Telemedicine (Telephone)  10/01/2018 [place] Heir [id number]   Session Start time: 2:42 Session End time: 2:52 Total time: 15 minutes  Referring Provider: Nolene Bernheim, NP Type of Visit: Telephonic Patient location: Home [facility] Provider location: [facility] All persons participating in visit: Patient Deyanira Lins and [facility] Camiah Humm  Confirmed patient's address: Yes  Confirmed patient's phone number: Yes  Any changes to demographics: No   Confirmed patient's insurance: Yes  Any changes to patient's insurance: No   Discussed confidentiality: Previous visit   The following statements were read to the patient and/or legal guardian that are established with the [facility] Provider.  "The purpose of this phone visit is to provide behavioral health care while limiting exposure to the coronavirus (COVID19).  There is a possibility of technology failure and discussed alternative modes of communication if that failure occurs."  "By engaging in this telephone visit, you consent to the provision of healthcare.  Additionally, you authorize for your insurance to be billed for the services provided during this telephone visit."   Patient and/or legal guardian consented to telephone visit: Yes   PRESENTING CONCERNS: Patient and/or family reports the following symptoms/concerns: Pt states she forgot about the video visit, but is feeling no shakiness in her hands, as she began feeling, at the onset of taking Zoloft at 100mg . Pt reports that her appetite has come back, doing daily CALM relaxation breathing and she feels calmer. Pt has not found a PCP yet, but is waiting for new patient availability during pandemic. No other concerns at this time.  Duration of problem: Pospartum; Severity of problem: mild  STRENGTHS (Protective Factors/Coping Skills): Resilient, open to change; positive outlook  GOALS ADDRESSED: Patient will: 1.  Reduce symptoms of:  anxiety and depression 2.  Demonstrate ability to: Increase healthy adjustment to current life circumstances  INTERVENTIONS: Interventions utilized:  Medication Monitoring Standardized Assessments completed: Not Needed  ASSESSMENT: Patient currently experiencing Adjustment disorder with mixed anxious and depressed mood  Patient may benefit from ongoing therapeutic interventions regarding coping with symptoms of anxiety and depression. Marland Kitchen  PLAN: 1. Follow up with behavioral health clinician on : As needed 2. Behavioral recommendations:  -Continue taking BH meds, as prescribed by medical provider -Continue using daily CALM relaxation breathing exercises  3. Referral(s): Integrated [employer] (In Clinic)  [facility]

## 2018-10-09 ENCOUNTER — Other Ambulatory Visit: Payer: Self-pay

## 2018-10-27 NOTE — Telephone Encounter (Signed)
Opened in error

## 2018-10-30 ENCOUNTER — Ambulatory Visit: Payer: Medicaid Other | Admitting: Clinical

## 2018-10-30 ENCOUNTER — Other Ambulatory Visit: Payer: Self-pay

## 2018-10-30 DIAGNOSIS — F4323 Adjustment disorder with mixed anxiety and depressed mood: Secondary | ICD-10-CM

## 2018-10-30 NOTE — BH Specialist Note (Signed)
Integrated Behavioral Health Visit via Telemedicine [facility] video)  10/30/2018 Audrey Pearson [id number]   Session Start time: 10:05  Session End time: 10:17 Total time: 15 minutes  Referring Provider: Earlie Server, NP, at previous visit Type of Visit: Webex video Patient location: Home [facility] Provider location: [facility] All persons participating in visit: [name] and [place]  Confirmed patient's address: Yes  Confirmed patient's phone number: Yes  Any changes to demographics: No   Confirmed patient's insurance: Yes  Any changes to patient's insurance: No   Discussed confidentiality: Discussed at a previous visit   The following statements were read to the patient and/or legal guardian that are established with the [facility] Provider.  "The purpose of this virtual visit is to provide behavioral health care while limiting exposure to the coronavirus (COVID19).  There is a possibility of technology failure and discussed alternative modes of communication if that failure occurs."  "By engaging in this virtual visit, you consent to the provision of healthcare.  Additionally, you authorize for your insurance to be billed for the services provided during this virtual visit."   Patient and/or legal guardian consented to virtual visit: Yes   PRESENTING CONCERNS: Patient and/or family reports the following symptoms/concerns: Pt states her primary concern today is that she is feeling less depressed on Zoloft, but that her anxiety level is still high; she realizes that when she feels "shaky", she is experiencing anxiety. Pt met her grandmother for the first time prior to grandmother's passing. Pt has also attended two funerals in the past week, and is still having difficulties finding a PCP during covid-19 restrictions.  Duration of problem: Increase in past week; Severity of problem: moderate  STRENGTHS (Protective Factors/Coping Skills): [employer], open to change,  and positive outlook  GOALS ADDRESSED: Patient will: 1.  Reduce symptoms of: anxiety and depression  2.  Demonstrate ability to: Increase healthy adjustment to current life circumstances and Begin healthy grieving over loss  INTERVENTIONS: Interventions utilized:  Medication Monitoring Standardized Assessments completed: Not Needed  ASSESSMENT: Patient currently experiencing Adjustment disorder with mixed anxious and depressed mood.  Patient may benefit from continued brief therapeutic interventions regarding coping with symptoms of anxiety and depression.  PLAN: 1. Follow up with behavioral health clinician on : by phone for any recommended changes in medication 2. Behavioral recommendations:  -Continue taking medication as prescribed -Continue using daily CALM relaxation exercises, along with other self-coping strategies that help to cope daily -Allow time to grieve loss of grandmother and friend's mother 3. Referral(s): [place] (In Clinic)  [place]

## 2018-11-03 ENCOUNTER — Telehealth: Payer: Self-pay | Admitting: Clinical

## 2018-11-03 MED ORDER — HYDROXYZINE PAMOATE 25 MG PO CAPS: 25.0000 mg | ORAL_CAPSULE | Freq: Three times a day (TID) | ORAL | 3 refills | Status: DC | PRN

## 2018-11-03 MED ORDER — SERTRALINE HCL 50 MG PO TABS: 150.0000 mg | ORAL_TABLET | Freq: Every day | ORAL | 3 refills | Status: DC

## 2018-11-03 NOTE — Addendum Note (Signed)
Addended by: Donnamae Jude on: 11/03/2018 04:05 PM   Modules accepted: Orders

## 2018-11-03 NOTE — Progress Notes (Signed)
I have increased her zoloft to 150 mg and added Vistaril.

## 2018-11-03 NOTE — Telephone Encounter (Signed)
Pt informed that her medical provider will send in a prescription for vistaril for anxiety, and pt agrees to a quick phone check-in either Wednesday or Thursday this week, for [facility] med management with [facility].

## 2019-01-13 HISTORY — PX: WISDOM TOOTH EXTRACTION: SHX21

## 2020-07-05 ENCOUNTER — Encounter (HOSPITAL_COMMUNITY): Payer: Self-pay

## 2020-07-05 ENCOUNTER — Emergency Department (HOSPITAL_COMMUNITY): Payer: Medicaid Other

## 2020-07-05 ENCOUNTER — Emergency Department (HOSPITAL_COMMUNITY)
Admission: EM | Admit: 2020-07-05 | Discharge: 2020-07-05 | Disposition: A | Discharge status: Home / Self Care | Payer: Medicaid Other | Attending: Emergency Medicine | Admitting: Emergency Medicine

## 2020-07-05 DIAGNOSIS — Y990 Civilian activity done for income or pay: Secondary | ICD-10-CM | POA: Diagnosis not present

## 2020-07-05 DIAGNOSIS — R079 Chest pain, unspecified: Secondary | ICD-10-CM | POA: Diagnosis not present

## 2020-07-05 DIAGNOSIS — W208XXA Other cause of strike by thrown, projected or falling object, initial encounter: Secondary | ICD-10-CM | POA: Insufficient documentation

## 2020-07-05 LAB — I-STAT BETA HCG BLOOD, ED (NOT ORDERABLE): I-stat hCG, quantitative: <5 m[IU]/mL (ref <5)

## 2020-07-05 LAB — CBC
HCT: 42.5 % (ref 36.0–46.0)
Hemoglobin: 14.6 g/dL (ref 12.0–15.0)
MCH: 30.7 pg (ref 26.0–34.0)
MCHC: 34.4 g/dL (ref 30.0–36.0)
MCV: 89.5 fL (ref 80.0–100.0)
Platelets: 218 10*3/uL (ref 150–400)
RBC: 4.75 MIL/uL (ref 3.87–5.11)
RDW: 14 % (ref 11.5–15.5)
WBC: 9.4 10*3/uL (ref 4.0–10.5)
nRBC: 0 % (ref 0.0–0.2)

## 2020-07-05 LAB — BASIC METABOLIC PANEL
Anion gap: 9 (ref 5–15)
BUN: 7 mg/dL (ref 6–20)
CO2: 26 mmol/L (ref 22–32)
Calcium: 9.4 mg/dL (ref 8.9–10.3)
Chloride: 105 mmol/L (ref 98–111)
Creatinine, Ser: 0.72 mg/dL (ref 0.44–1.00)
GFR, Estimated: >60 mL/min (ref >60)
Glucose, Bld: 90 mg/dL (ref 70–99)
Potassium: 3.7 mmol/L (ref 3.5–5.1)
Sodium: 140 mmol/L (ref 135–145)

## 2020-07-05 LAB — TROPONIN I (HIGH SENSITIVITY): Troponin I (High Sensitivity): <2 ng/L (ref <18)

## 2020-07-05 NOTE — ED Notes (Signed)
Called 3x for room placement. Eloped from waiting area.  

## 2020-07-05 NOTE — ED Triage Notes (Signed)
Pt presents with c/o chest pain that started approx one hour ago after a box fell onto her chest at work. Pt went to [facility] but was told to come here as her EKG looked abnormal.

## 2020-10-25 ENCOUNTER — Encounter (HOSPITAL_COMMUNITY): Payer: Self-pay

## 2020-10-25 ENCOUNTER — Other Ambulatory Visit: Payer: Self-pay

## 2020-10-25 ENCOUNTER — Emergency Department (HOSPITAL_COMMUNITY)
Admission: EM | Admit: 2020-10-25 | Discharge: 2020-10-25 | Disposition: A | Discharge status: Home / Self Care | Payer: Medicaid Other | Attending: Emergency Medicine | Admitting: Emergency Medicine

## 2020-10-25 DIAGNOSIS — Z20822 Contact with and (suspected) exposure to covid-19: Secondary | ICD-10-CM | POA: Insufficient documentation

## 2020-10-25 DIAGNOSIS — J069 Acute upper respiratory infection, unspecified: Secondary | ICD-10-CM | POA: Insufficient documentation

## 2020-10-25 DIAGNOSIS — J45909 Unspecified asthma, uncomplicated: Secondary | ICD-10-CM | POA: Diagnosis not present

## 2020-10-25 DIAGNOSIS — J029 Acute pharyngitis, unspecified: Secondary | ICD-10-CM | POA: Diagnosis present

## 2020-10-25 DIAGNOSIS — Z87891 Personal history of nicotine dependence: Secondary | ICD-10-CM | POA: Insufficient documentation

## 2020-10-25 LAB — GROUP A STREP BY PCR: Group A Strep by PCR: NOT DETECTED

## 2020-10-25 LAB — RESP PANEL BY RT-PCR (FLU A&B, COVID) ARPGX2
Influenza A by PCR: NEGATIVE
Influenza B by PCR: NEGATIVE
SARS Coronavirus 2 by RT PCR: NEGATIVE

## 2020-10-25 MED ORDER — BENZONATATE 200 MG PO CAPS: 200.0000 mg | ORAL_CAPSULE | Freq: Three times a day (TID) | ORAL | 0 refills | Status: AC

## 2020-10-25 NOTE — ED Triage Notes (Signed)
Patient c/o headache, sore throat, fever, chills, body aches x 4 days.

## 2020-10-25 NOTE — Discharge Instructions (Addendum)
Home to quarantine pending COVID test results. Your results will be available in your MyChart account later today. If your strep test is positive, an antibiotic will be sent to your pharmacy.  Check with your doctor if not improving in 7 to 10 days. Work note is provided for return to work later this week, if your COVID test is positive, notify your employer and return per their protocol.
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
## 2020-10-27 ENCOUNTER — Other Ambulatory Visit: Payer: Self-pay

## 2020-10-27 ENCOUNTER — Emergency Department (HOSPITAL_COMMUNITY): Payer: Medicaid Other

## 2020-10-27 ENCOUNTER — Emergency Department (HOSPITAL_COMMUNITY)
Admission: EM | Admit: 2020-10-27 | Discharge: 2020-10-27 | Disposition: A | Discharge status: Home / Self Care | Payer: Medicaid Other | Attending: Emergency Medicine | Admitting: Emergency Medicine

## 2020-10-27 ENCOUNTER — Encounter (HOSPITAL_COMMUNITY): Payer: Self-pay

## 2020-10-27 DIAGNOSIS — J45909 Unspecified asthma, uncomplicated: Secondary | ICD-10-CM | POA: Diagnosis not present

## 2020-10-27 DIAGNOSIS — Z79899 Other long term (current) drug therapy: Secondary | ICD-10-CM | POA: Diagnosis not present

## 2020-10-27 DIAGNOSIS — Z87891 Personal history of nicotine dependence: Secondary | ICD-10-CM | POA: Insufficient documentation

## 2020-10-27 DIAGNOSIS — D72829 Elevated white blood cell count, unspecified: Secondary | ICD-10-CM | POA: Diagnosis not present

## 2020-10-27 DIAGNOSIS — R1110 Vomiting, unspecified: Secondary | ICD-10-CM | POA: Insufficient documentation

## 2020-10-27 DIAGNOSIS — R197 Diarrhea, unspecified: Secondary | ICD-10-CM | POA: Insufficient documentation

## 2020-10-27 DIAGNOSIS — R519 Headache, unspecified: Secondary | ICD-10-CM | POA: Insufficient documentation

## 2020-10-27 DIAGNOSIS — R1031 Right lower quadrant pain: Secondary | ICD-10-CM | POA: Insufficient documentation

## 2020-10-27 DIAGNOSIS — K589 Irritable bowel syndrome without diarrhea: Secondary | ICD-10-CM | POA: Diagnosis not present

## 2020-10-27 DIAGNOSIS — R1032 Left lower quadrant pain: Secondary | ICD-10-CM | POA: Insufficient documentation

## 2020-10-27 DIAGNOSIS — N390 Urinary tract infection, site not specified: Secondary | ICD-10-CM

## 2020-10-27 LAB — COMPREHENSIVE METABOLIC PANEL
ALT: 12 U/L (ref 0–44)
AST: 15 U/L (ref 15–41)
Albumin: 4 g/dL (ref 3.5–5.0)
Alkaline Phosphatase: 60 U/L (ref 38–126)
Anion gap: 10 (ref 5–15)
BUN: <5 mg/dL — ABNORMAL LOW (ref 6–20)
CO2: 26 mmol/L (ref 22–32)
Calcium: 9.2 mg/dL (ref 8.9–10.3)
Chloride: 104 mmol/L (ref 98–111)
Creatinine, Ser: 0.67 mg/dL (ref 0.44–1.00)
GFR, Estimated: >60 mL/min (ref >60)
Glucose, Bld: 91 mg/dL (ref 70–99)
Potassium: 3.5 mmol/L (ref 3.5–5.1)
Sodium: 140 mmol/L (ref 135–145)
Total Bilirubin: 0.4 mg/dL (ref 0.3–1.2)
Total Protein: 7.3 g/dL (ref 6.5–8.1)

## 2020-10-27 LAB — LIPASE, BLOOD: Lipase: 22 U/L (ref 11–51)

## 2020-10-27 LAB — CBC
HCT: 38.3 % (ref 36.0–46.0)
Hemoglobin: 13.2 g/dL (ref 12.0–15.0)
MCH: 30.9 pg (ref 26.0–34.0)
MCHC: 34.5 g/dL (ref 30.0–36.0)
MCV: 89.7 fL (ref 80.0–100.0)
Platelets: 202 10*3/uL (ref 150–400)
RBC: 4.27 MIL/uL (ref 3.87–5.11)
RDW: 13.3 % (ref 11.5–15.5)
WBC: 16.3 10*3/uL — ABNORMAL HIGH (ref 4.0–10.5)
nRBC: 0 % (ref 0.0–0.2)

## 2020-10-27 LAB — URINALYSIS, MICROSCOPIC (REFLEX)

## 2020-10-27 LAB — URINALYSIS, ROUTINE W REFLEX MICROSCOPIC
Bilirubin Urine: NEGATIVE
Glucose, UA: NEGATIVE mg/dL
Ketones, ur: NEGATIVE mg/dL
Nitrite: NEGATIVE
Protein, ur: NEGATIVE mg/dL
Specific Gravity, Urine: 1.01 (ref 1.005–1.030)
pH: 8 (ref 5.0–8.0)

## 2020-10-27 LAB — I-STAT BETA HCG BLOOD, ED (MC, WL, AP ONLY): I-stat hCG, quantitative: 8.5 m[IU]/mL — ABNORMAL HIGH (ref <5)

## 2020-10-27 MED ORDER — CEPHALEXIN 500 MG PO CAPS: 500.0000 mg | ORAL_CAPSULE | Freq: Four times a day (QID) | ORAL | 0 refills | Status: DC

## 2020-10-27 MED ORDER — MORPHINE SULFATE (PF) 4 MG/ML IV SOLN
6.0000 mg | Freq: Once | INTRAVENOUS | Status: AC
  Administered 2020-10-27: 6 mg via INTRAVENOUS
  Filled 2020-10-27: qty 2

## 2020-10-27 MED ORDER — LACTATED RINGERS IV BOLUS
1000.0000 mL | Freq: Once | INTRAVENOUS | Status: AC
  Administered 2020-10-27: 1000 mL via INTRAVENOUS

## 2020-10-27 MED ORDER — LACTATED RINGERS IV SOLN: INTRAVENOUS | Status: DC

## 2020-10-27 MED ORDER — SODIUM CHLORIDE (PF) 0.9 % IJ SOLN
INTRAMUSCULAR | Status: AC
  Filled 2020-10-27: qty 50

## 2020-10-27 MED ORDER — HYDROMORPHONE HCL 1 MG/ML IJ SOLN
0.5000 mg | Freq: Once | INTRAMUSCULAR | Status: AC
  Administered 2020-10-27: 0.5 mg via INTRAVENOUS
  Filled 2020-10-27: qty 1

## 2020-10-27 MED ORDER — IOHEXOL 300 MG/ML  SOLN
100.0000 mL | Freq: Once | INTRAMUSCULAR | Status: AC | PRN
  Administered 2020-10-27: 100 mL via INTRAVENOUS

## 2020-10-27 MED ORDER — SODIUM CHLORIDE 0.9 % IV SOLN
1.0000 g | INTRAVENOUS | Status: DC
  Administered 2020-10-27: 1 g via INTRAVENOUS
  Filled 2020-10-27: qty 10
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
## 2020-10-27 NOTE — ED Triage Notes (Signed)
Patient c/o mid abdominal pain, N/v/d, right ear pain, sore throat, chills, and body aches x 6 days. Patient states she had a negative Covid, flu, and Strep.

## 2020-11-08 ENCOUNTER — Other Ambulatory Visit: Payer: Self-pay

## 2020-11-08 ENCOUNTER — Encounter: Payer: Self-pay | Admitting: Obstetrics

## 2020-11-08 ENCOUNTER — Other Ambulatory Visit (HOSPITAL_COMMUNITY)
Admission: RE | Admit: 2020-11-08 | Discharge: 2020-11-08 | Disposition: A | Discharge status: Home / Self Care | Payer: Medicaid Other | Source: Ambulatory Visit | Attending: Obstetrics | Admitting: Obstetrics

## 2020-11-08 ENCOUNTER — Ambulatory Visit (INDEPENDENT_AMBULATORY_CARE_PROVIDER_SITE_OTHER): Payer: Medicaid Other | Admitting: Obstetrics

## 2020-11-08 VITALS — Ht 65.5 in | Wt 131.9 lb

## 2020-11-08 DIAGNOSIS — Z01419 Encounter for gynecological examination (general) (routine) without abnormal findings: Secondary | ICD-10-CM | POA: Insufficient documentation

## 2020-11-08 DIAGNOSIS — Z3490 Encounter for supervision of normal pregnancy, unspecified, unspecified trimester: Secondary | ICD-10-CM

## 2020-11-08 DIAGNOSIS — N898 Other specified noninflammatory disorders of vagina: Secondary | ICD-10-CM | POA: Insufficient documentation

## 2020-11-08 DIAGNOSIS — Z331 Pregnant state, incidental: Secondary | ICD-10-CM | POA: Diagnosis not present

## 2020-11-08 NOTE — Progress Notes (Signed)
Subjective:        Audrey Pearson is a 30 y.o. female here for a routine exam.  Current complaints: Vaginal discharge.    Personal health questionnaire:  Is patient Ashkenazi Jewish, have a family history of breast and/or ovarian cancer: yes.  Mother with Breast CA, deceased Is there a family history of uterine cancer diagnosed at age < 79, gastrointestinal cancer, urinary tract cancer, family member who is a Personnel officer syndrome-associated carrier: no Is the patient overweight and hypertensive, family history of diabetes, personal history of gestational diabetes, preeclampsia or PCOS: no Is patient over 24, have PCOS,  family history of premature CHD under age 32, diabetes, smoke, have hypertension or peripheral artery disease:  no At any time, has a partner hit, kicked or otherwise hurt or frightened you?: no Over the past 2 weeks, have you felt down, depressed or hopeless?: no Over the past 2 weeks, have you felt little interest or pleasure in doing things?:no   Gynecologic History Patient's last menstrual period was 10/10/2020 (exact date). Contraception: Ortho-Evra patches weekly Last Pap: 2018. Results were: normal Last mammogram: n/a. Results were: n/a  Obstetric History OB History  Gravida Para Term Preterm AB Living  4 2 2  0 1 2  SAB IAB Ectopic Multiple Live Births  1 0 0 0 2    # Outcome Date GA Lbr Len/2nd Weight Sex Delivery Anes PTL Lv  4 Term 05/18/18 [redacted]w[redacted]d 21:06 / 00:25 6 lb 15.6 oz (3.165 kg) F Vag-Spont EPI  LIV  3 Term 12/20/16 [redacted]w[redacted]d 43:59 6 lb 10.7 oz (3.025 kg) F Vag-Spont EPI  LIV  2 SAB 06/2014          1 Gravida             Past Medical History:  Diagnosis Date   Anxiety    Asthma    Chronic constipation    Depression    IBS (irritable bowel syndrome)     Past Surgical History:  Procedure Laterality Date   NO PAST SURGERIES     WISDOM TOOTH EXTRACTION  01/2019     Current Outpatient Medications:    albuterol (PROVENTIL HFA;VENTOLIN HFA) 108 (90  Base) MCG/ACT inhaler, Inhale 2 puffs into the lungs every 4 (four) hours as needed for wheezing or shortness of breath., Disp: , Rfl:    cephALEXin (KEFLEX) 500 MG capsule, Take 1 capsule (500 mg total) by mouth 4 (four) times daily., Disp: 40 capsule, Rfl: 0   doxycycline (VIBRAMYCIN) 100 MG capsule, Take 100 mg by mouth 2 (two) times daily., Disp: , Rfl:    pantoprazole (PROTONIX) 20 MG tablet, Take 1 tablet by mouth daily., Disp: , Rfl:    sertraline (ZOLOFT) 50 MG tablet, Take 3 tablets (150 mg total) by mouth daily., Disp: 90 tablet, Rfl: 3   hydrOXYzine (VISTARIL) 25 MG capsule, Take 1 capsule (25 mg total) by mouth 3 (three) times daily as needed for anxiety. (Patient not taking: Reported on 11/08/2020), Disp: 270 capsule, Rfl: 3 No Known Allergies  Social History   Tobacco Use   Smoking status: Former    Pack years: 0.00    Types: Cigarettes    Quit date: 2019    Years since quitting: 3.4   Smokeless tobacco: Never  Substance Use Topics   Alcohol use: Not Currently    Comment: not since confirmed pregnancy    Family History  Adopted: Yes  Problem Relation Age of Onset   Breast cancer Mother  Alcohol abuse Mother    Drug abuse Mother    Alcohol abuse Father    Drug abuse Father    Sickle cell anemia Sister    Drug abuse Brother       Review of Systems  Constitutional: negative for fatigue and weight loss Respiratory: negative for cough and wheezing Cardiovascular: negative for chest pain, fatigue and palpitations Gastrointestinal: negative for abdominal pain and change in bowel habits Musculoskeletal:negative for myalgias Neurological: negative for gait problems and tremors Behavioral/Psych: negative for abusive relationship, depression Endocrine: negative for temperature intolerance    Genitourinary: positive for vaginal discharge.  negative for abnormal menstrual periods, genital lesions, hot flashes, sexual problems  Integument/breast: negative for breast lump,  breast tenderness, nipple discharge and skin lesion(s)    Objective:       BP (P) 114/74   Ht 5' 5.5" (1.664 m)   Wt 131 lb 14.4 oz (59.8 kg)   LMP 10/10/2020 (Exact Date) Comment: preg test wavier signed 10/27/20  BMI 21.62 kg/m  General:    And no distress  Skin:   no rash or abnormalities  Lungs:   clear to auscultation bilaterally  Heart:   regular rate and rhythm, S1, S2 normal, no murmur, click, rub or gallop  Breasts:   normal without suspicious masses, skin or nipple changes or axillary nodes  Abdomen:  normal findings: no organomegaly, soft, non-tender and no hernia  Pelvis:  External genitalia: normal general appearance Urinary system: urethral meatus normal and bladder without fullness, nontender Vaginal: normal without tenderness, induration or masses Cervix: normal appearance Adnexa: normal bimanual exam Uterus: anteverted and non-tender, slightly enlarged, soft   Lab Review Urine pregnancy test Labs reviewed yes Radiologic studies reviewed no  I have spent a total of 20 minutes of face-to-face time, excluding clinical staff time, reviewing notes and preparing to see patient, ordering tests and/or medications, and counseling the patient.    Assessment:    1. Encounter for gynecological examination with Papanicolaou smear of cervix Rx: - Cytology - PAP( [place])  2. Vaginal discharge Rx: - Cervicovaginal ancillary only( [place])  3. Early stage of pregnancy.  Clinically stable. Rx: - Beta hCG quant (ref lab).   - repeat quant in 48 hours.  Ultrasound for viability  and dating with quant > 2000    Plan:    Education reviewed: calcium supplements, depression evaluation, low fat, low cholesterol diet, safe sex/STD prevention, self breast exams, and weight bearing exercise. Follow up in: 2 days. Repeat quant. Beta-HCG   Orders Placed This Encounter  Procedures   Beta hCG quant (ref lab)     Brock Bad, MD 11/08/2020 10:51 AM

## 2020-11-08 NOTE — Progress Notes (Signed)
Patient presents as a New Patient for AEX. Patient states that she had a positive hcg on 6/16 at [facility]. Patient desires STD testing.  Last Pap: Last year in [place]. Patient states that it was normal

## 2020-11-09 ENCOUNTER — Other Ambulatory Visit: Payer: Self-pay | Admitting: Obstetrics

## 2020-11-09 DIAGNOSIS — B3731 Acute candidiasis of vulva and vagina: Secondary | ICD-10-CM

## 2020-11-09 DIAGNOSIS — N760 Acute vaginitis: Secondary | ICD-10-CM

## 2020-11-09 LAB — CERVICOVAGINAL ANCILLARY ONLY
Bacterial Vaginitis (gardnerella): POSITIVE — AB
Candida Glabrata: NEGATIVE
Candida Vaginitis: POSITIVE — AB
Chlamydia: NEGATIVE
Comment: NEGATIVE
Comment: NEGATIVE
Comment: NEGATIVE
Comment: NEGATIVE
Comment: NEGATIVE
Comment: NORMAL
Neisseria Gonorrhea: NEGATIVE
Trichomonas: NEGATIVE

## 2020-11-09 LAB — BETA HCG QUANT (REF LAB): hCG Quant: <1 m[IU]/mL

## 2020-11-09 MED ORDER — METRONIDAZOLE 500 MG PO TABS: 500.0000 mg | ORAL_TABLET | Freq: Two times a day (BID) | ORAL | 2 refills | Status: DC

## 2020-11-09 MED ORDER — FLUCONAZOLE 150 MG PO TABS: 150.0000 mg | ORAL_TABLET | Freq: Once | ORAL | 0 refills | Status: AC

## 2020-11-16 LAB — CYTOLOGY - PAP
Comment: NEGATIVE
Comment: NEGATIVE
Diagnosis: NEGATIVE
HPV 16: NEGATIVE
HPV 18 / 45: NEGATIVE
High risk HPV: POSITIVE — AB

## 2020-12-14 ENCOUNTER — Encounter: Payer: Medicaid Other | Admitting: Women's Health

## 2020-12-14 ENCOUNTER — Ambulatory Visit (INDEPENDENT_AMBULATORY_CARE_PROVIDER_SITE_OTHER): Payer: Medicaid Other | Admitting: Women's Health

## 2020-12-14 ENCOUNTER — Other Ambulatory Visit: Payer: Self-pay | Admitting: *Deleted

## 2020-12-14 ENCOUNTER — Other Ambulatory Visit: Payer: Self-pay

## 2020-12-14 VITALS — BP 106/71 | HR 61 | Wt 132.4 lb

## 2020-12-14 DIAGNOSIS — Z8759 Personal history of other complications of pregnancy, childbirth and the puerperium: Secondary | ICD-10-CM | POA: Diagnosis not present

## 2020-12-14 DIAGNOSIS — Z3202 Encounter for pregnancy test, result negative: Secondary | ICD-10-CM

## 2020-12-14 DIAGNOSIS — Z3200 Encounter for pregnancy test, result unknown: Secondary | ICD-10-CM

## 2020-12-14 LAB — POCT URINE PREGNANCY: Preg Test, Ur: NEGATIVE

## 2020-12-14 NOTE — Progress Notes (Signed)
  History:  [name] is a 30 y.o. [id number] who presents to clinic today for SAB f/u. Patient denies abnormal pain and bleeding and reports she just finished her second period s/p SAB. Patient reports she was using the patch, but stopped using it as it kept falling off and she was confused on how to use it.   The following portions of the patient's history were reviewed and updated as appropriate: allergies, current medications, family history, past medical history, social history, past surgical history and problem list.  Review of Systems:  Review of Systems  All other systems reviewed and are negative.    Objective:  Physical Exam BP 106/71   Pulse 61   Wt 132 lb 6.4 oz (60.1 kg)   BMI 21.70 kg/m   Physical Exam Vitals and nursing note reviewed.  Constitutional:      General: She is not in acute distress.    Appearance: Normal appearance. She is not ill-appearing, toxic-appearing or diaphoretic.  HENT:     Head: Normocephalic and atraumatic.  Pulmonary:     Effort: Pulmonary effort is normal.  Neurological:     Mental Status: She is alert and oriented to person, place, and time.  Psychiatric:        Mood and Affect: Mood normal.        Behavior: Behavior normal.        Thought Content: Thought content normal.        Judgment: Judgment normal.   Labs and Imaging Results for orders placed or performed in visit on 12/14/20 (from the past 24 hour(s))  POCT urine pregnancy     Status: None   Collection Time: 12/14/20 10:37 AM  Result Value Ref Range   Preg Test, Ur Negative Negative    No results found.  Health Maintenance Due  Topic Date Due   COVID-19 Vaccine (1) Never done   Hepatitis C Screening  Never done   Pneumococcal Vaccine 39-25 Years old (3 - PCV) 05/21/2019   INFLUENZA VACCINE  12/12/2020     Assessment & Plan:  1. Possible pregnancy - POCT urine pregnancy - negative  2. Miscarriage within last 12 months -will change patch RX to brand only as  generic often has difficulty sticking -discussed use of patch and time to effectiveness -reviewed Pap results and discussed significance of HPV results  Approximately 10 minutes of total time was spent with this patient on counseling.  Marylen Ponto, NP 12/14/2020 10:52 AM

## 2020-12-14 NOTE — Patient Instructions (Signed)
Pap Test Why am I having this test? A Pap test, also called a Pap smear, is a screening test to check for signs of: Cancer of the vagina, cervix, and uterus. The cervix is the lower part of the uterus that opens into the vagina. Infection. Changes that may be a sign that cancer is developing (precancerous changes). Women need this test on a regular basis. In general, you should have a Pap test every 3 years until you reach menopause or age 32. Women aged 30-60 may choose to have their Pap test done at the same time as an HPV (human papillomavirus) test every 5 years (instead of every 3 years). Your health care provider may recommend having Pap tests more or less oftendepending on your medical conditions and past Pap test results. What kind of sample is taken?  Your health care provider will collect a sample of cells from the surface of your cervix. This will be done using a small cotton swab, plastic spatula, or brush. This sample is often collected during a pelvic exam, when you are lying on your back on an exam table with feet in footrests (stirrups). In some cases, fluids (secretions) from the cervix or vagina may also be collected. How do I prepare for this test? Be aware of where you are in your menstrual cycle. If you are menstruating on the day of the test, you may be asked to reschedule. You may need to reschedule if you have a known vaginal infection on the day of the test. Follow instructions from your health care provider about: Changing or stopping your regular medicines. Some medicines can cause abnormal test results, such as digitalis and tetracycline. Avoiding douching or taking a bath the day before or the day of the test. Tell a health care provider about: Any allergies you have. All medicines you are taking, including vitamins, herbs, eye drops, creams, and over-the-counter medicines. Any blood disorders you have. Any surgeries you have had. Any medical conditions you  have. Whether you are pregnant or may be pregnant. How are the results reported? Your test results will be reported as either abnormal or normal. A false-positive result can occur. A false positive is incorrect because itmeans that a condition is present when it is not. A false-negative result can occur. A false negative is incorrect because itmeans that a condition is not present when it is. What do the results mean? A normal test result means that you do not have signs of cancer of the vagina,cervix, or uterus. An abnormal result may mean that you have: Cancer. A Pap test by itself is not enough to diagnose cancer. You will have more tests done in this case. Precancerous changes in your vagina, cervix, or uterus. Inflammation of the cervix. An STD (sexually transmitted disease). A fungal infection. A parasite infection. Talk with your health care provider about what your results mean. Questions to ask your health care provider Ask your health care provider, or the department that is doing the test: When will my results be ready? How will I get my results? What are my treatment options? What other tests do I need? What are my next steps? Summary In general, women should have a Pap test every 3 years until they reach menopause or age 40. Your health care provider will collect a sample of cells from the surface of your cervix. This will be done using a small cotton swab, plastic spatula, or brush. In some cases, fluids (secretions) from the cervix or  vagina may also be collected. This information is not intended to replace advice given to you by your health care provider. Make sure you discuss any questions you have with your healthcare provider. Document Revised: 04/12/2020 Document Reviewed: 01/01/2020 Elsevier Patient Education  2022 Else[employer].       HPV and Cancer Information HPV (human papillomavirus)is a very common virus that spreads easily from person to person through  skin-to-skin or sexual contact. There are many types of HPV. It often does not cause symptoms. However, depending upon the type, it may sometimes cause warts in the genitals (genital or mucosal HPV), or on the hands or feet (cutaneous or nonmucosal HPV). It is possible to be infected for a long time and pass HPV to others withoutknowing it. Some HPV infections go away on their own within 2 years, but other HPV infections are considered high-risk and may cause changes in cells that could lead to cancer. You can take steps to avoid HPV infection and to lower yourrisk of getting cancer. How can HPV affect me? HPV can cause warts in the genitals or on the hands or feet. It can also causewart-like lesions in the throat.  Certain types of genital HPV can also cause cancer, which may include: Cervical cancer. Vaginal cancer. Vulvar cancer. Anal cancer. Throat cancer. Tongue or mouth cancer. Penile cancer. How does HPV spread? HPV spreads easily through direct person to person contact. Genital HPV spreads through sexual contact. You can get HPV from vaginal sex, oral sex, anal sex, or just by touching someone's genitals. Even people who have only one sexual partner may have HPV because that partner may have it. HPV often does not causesymptoms, so most infected people do not know that they have it. What actions can I take to prevent HPV? Take the following steps to help prevent HPV infection: Talk with your health care provider about getting the HPV vaccine. This vaccine protects against the types of HPV that could cause cancer. Limit the number of people you have sex with. Also, avoid having sex with people who have had many sexual partners. Use a condom during sex. Talk with your sexual partners about their health. What actions can I take to lower my risk for cancer? Having a healthy lifestyle and taking some preventive steps can help lower your cancer risk, whether or not you have genital HPV. Some  steps you can takeinclude: Lifestyle Practice safe sex to help prevent HPV infection. Do not use any products that contain nicotine or tobacco, such as cigarettes, e-cigarettes, and chewing tobacco. If you need help quitting, ask your health care provider. Eat foods that have antioxidants, such as fruits, vegetables, and grains. Try to eat at least 5 servings of fruits and vegetables every day. Get regular exercise. Lose weight if you are overweight. Practice good oral hygiene. This includes flossing and brushing your teeth every day. Other preventive steps Get the HPV vaccine as told by your health care provider. Get tested for STIs even if you do not have symptoms of HPV. You may have HPV and not know it. If you are a woman, get regular Pap and HPV tests. Talk with your health care provider about how often you need these tests. Pap tests will help identify changes in cells that can lead to cancer. HPV tests will help identify the presence of HPV in cells in the cervix. Where to find more information Learn more about HPV and cancer from: Centers for Disease Control and Prevention: RunningConvention.de  National Cancer Institute: www.cancer.gov American Cancer Society: www.cancer.org Contact a health care provider if: You have genital warts. You are sexually active and think you may have HPV. You did not protect yourself during sex and would like to be tested for STIs. Summary Human papillomavirus (HPV) is a very common virus that spreads easily from person to person and ishighly contagious. Certain types of genital HPV are considered to be high risk and may cause changes in cells that could lead to cancer. You should take steps to avoid HPV infection, such as limiting the number of people you have sex with, using condoms during sex, and getting the HPV vaccine. Lifestyle changes can help lower your risk of cancer. These include eating a healthy diet, getting regular exercise, and not using any  products that contain nicotine or tobacco. You may have HPV and not know it. Get tested for STIs even if you do not have symptoms of HPV. If you are a woman, have regular Pap tests and HPV tests as directed by your health care provider. This information is not intended to replace advice given to you by your health care provider. Make sure you discuss any questions you have with your healthcare provider. Document Revised: 12/15/2019 Document Reviewed: 12/15/2019 Elsevier Patient Education  2022 Else[employer] Inc.       Ethinyl Estradiol; Norelgestromin Patches What is this medication? ETHINYL ESTRADIOL;NORELGESTROMIN (ETH in il es tra DYE ole; nor el JES troe min) prevents ovulation and pregnancy. It belongs to a group of medications called contraceptives. It is a combination of the hormones estrogen andprogestin. This medicine may be used for other purposes; ask your health care provider orpharmacist if you have questions. COMMON BRAND NAME(S): Ortho Christianne BorrowEvra, Xulane What should I tell my care team before I take this medication? They need to know if you have or ever had any of these conditions: Abnormal vaginal bleeding Blood vessel disease or blood clots Breast, cervical, endometrial, ovarian, liver, or uterine cancer Diabetes (high blood sugar) Gallbladder disease Having surgery Heart disease or recent heart attack High blood pressure High cholesterol or triglycerides History of irregular heartbeat or heart valve problems Kidney disease Liver disease Migraine headaches Protein C/S deficiency Recently had a baby, miscarriage, or abortion Stroke Systemic lupus erythematosus (SLE) Tobacco smoker An unusual or allergic reaction to estrogens, progestins, other medications, foods, dyes, or preservatives Pregnant or trying to get pregnant Breast-feeding How should I use this medication? This patch is applied to the skin. Follow the directions on the prescription label. Apply to clean, dry,  healthy skin on the buttock, abdomen, upper outer arm or upper torso, in a place where it will not be rubbed by tight clothing. Do not use lotions or other cosmetics on the site where the patch will go. Press the patch firmly in place for 10 seconds to ensure good contact with the skin. Change the patch every 7 days on the same day of the week for 3 weeks. You will then have a break from the patch for 1 week, after which you willapply a new patch. Do not use your medication more often than directed. Contact your care team about the use of this medication in children. Special care may be needed. This medication has been used in female children who havestarted having menstrual periods. A patient package insert for the product will be given with each prescription and refill. Read this sheet carefully each time. The sheet may changefrequently. Overdosage: If you think you have taken too much  of this medicine contact apoison control center or emergency room at once. NOTE: This medicine is only for you. Do not share this medicine with others. What if I miss a dose? You will need to replace your patch once a week as directed. If your patch is lost or falls off, contact your care team for advice. You may need to useanother form of birth control if your patch has been off for more than 1 day. What may interact with this medication? Do not take this medication with the following: Dasabuvir; ombitasvir; paritaprevir; ritonavir Ombitasvir; paritaprevir; ritonavir This medication may also interact with the following: Acetaminophen Antibiotics or medications for infections, especially rifampin, rifabutin, rifapentine, and possibly penicillins or tetracyclines Aprepitant or fosaprepitant Armodafinil Ascorbic acid (vitamin C) Barbiturate medications, such as phenobarbital or primidone Bosentan Certain antiviral medications for hepatitis, HIV or AIDS Certain medications for cancer treatment Certain medications  for seizures like carbamazepine, clobazam, felbamate, lamotrigine, oxcarbazepine, phenytoin, rufinamide, topiramate Certain medications for treating high cholesterol Cyclosporine Dantrolene Elagolix Flibanserin Grapefruit juice Lesinurad Medications for diabetes Medications to treat fungal infections, such as griseofulvin, miconazole, fluconazole, ketoconazole, itraconazole, posaconazole or voriconazole Mifepristone Mitotane Modafinil Morphine Mycophenolate St. John's wort Tamoxifen Temazepam Theophylline or aminophylline Thyroid hormones Tizanidine Tranexamic acid Ulipristal Warfarin This list may not describe all possible interactions. Give your health care provider a list of all the medicines, herbs, non-prescription drugs, or dietary supplements you use. Also tell them if you smoke, drink alcohol, or use illegaldrugs. Some items may interact with your medicine. What should I watch for while using this medication? Visit your care team for regular checks on your progress. You will need aregular breast and pelvic exam and Pap smear while on this medication. Use an additional method of contraception during the first cycle that you usethis patch. If you have any reason to think you are pregnant, stop using this medicationright away and contact your care team. If you are using this medication for hormone related problems, it may takeseveral cycles of use to see improvement in your condition. Smoking increases the risk of getting a blood clot or having a stroke while you are using hormonal birth control, especially if you are older than 32 yearsold. You are strongly advised not to smoke. This medication can make your body retain fluid, making your fingers, hands, or ankles swell. Your blood pressure can go up. Contact your care team if you feelyou are retaining fluid. This medication can make you more sensitive to the sun. Keep out of the sun. If you cannot avoid being in the sun, wear  protective clothing and use sunscreen.Do not use sun lamps or tanning beds/booths. If you wear contact lenses and notice visual changes, or if the lenses begin tofeel uncomfortable, consult your eye care specialist. In some women, tenderness, swelling, or minor bleeding of the gums may occur. Notify your dentist if this happens. Brushing and flossing your teeth regularly may help limit this. See your dentist regularly and inform your dentist of themedications you are taking. If you are going to have elective surgery or an MRI, you may need to stop usingthis medication before the surgery or MRI. Consult your care team for advice. This medication does not protect you against HIV infection (AIDS) or any othersexually transmitted infections. What side effects may I notice from receiving this medication? Side effects that you should report to your care team as soon as possible: Allergic reactions-skin rash, itching, hives, swelling of the face, lips, tongue, or throat Blood clot-pain,  swelling, or warmth in the leg, shortness of breath, chest pain Gallbladder problems-severe stomach pain, nausea, vomiting, fever Increase in blood pressure Liver injury-right upper belly pain, loss of appetite, nausea, light-colored stool, dark yellow or brown urine, yellowing skin or eyes, unusual weakness or fatigue New or worsening migraines or headaches Stroke-sudden numbness or weakness of the face, arm, or leg, trouble speaking, confusion, trouble walking, loss of balance or coordination, dizziness, severe headache, change in vision Unusual vaginal discharge, itching, or odor Worsening mood, feelings of depression Side effects that usually do not require medical attention (report to your careteam if they continue or are bothersome): Breast pain or tenderness Dark patches of skin on the face or other sun-exposed areas Irregular menstrual cycles or spotting Nausea Weight gain This list may not describe all  possible side effects. Call your doctor for medical advice about side effects. You may report side effects to FDA at1-800-FDA-1088. Where should I keep my medication? Keep out of the reach of children and pets. Store at room temperature between 15 and 30 degrees C (59 and 86 degrees F). Keep the patch in its pouch until time of use. Throw away any unused medicationafter the expiration date. Dispose of used patches properly. Since a used patch may still contain active hormones, fold the patch in half so that it sticks to itself prior to disposal.Throw away in a place where children or pets cannot reach. NOTE: This sheet is a summary. It may not cover all possible information. If you have questions about this medicine, talk to your doctor, pharmacist, orhealth care provider.  2022 Elsevier/Gold Standard (2020-05-25 14:11:14)

## 2020-12-26 ENCOUNTER — Encounter (HOSPITAL_COMMUNITY): Payer: Self-pay

## 2020-12-26 ENCOUNTER — Other Ambulatory Visit: Payer: Self-pay

## 2020-12-26 ENCOUNTER — Emergency Department (HOSPITAL_COMMUNITY)
Admission: EM | Admit: 2020-12-26 | Discharge: 2020-12-26 | Disposition: A | Discharge status: Home / Self Care | Payer: Medicaid Other | Attending: Emergency Medicine | Admitting: Emergency Medicine

## 2020-12-26 DIAGNOSIS — Z5321 Procedure and treatment not carried out due to patient leaving prior to being seen by health care provider: Secondary | ICD-10-CM | POA: Diagnosis not present

## 2020-12-26 DIAGNOSIS — R197 Diarrhea, unspecified: Secondary | ICD-10-CM | POA: Insufficient documentation

## 2020-12-26 DIAGNOSIS — R112 Nausea with vomiting, unspecified: Secondary | ICD-10-CM | POA: Insufficient documentation

## 2020-12-26 DIAGNOSIS — K625 Hemorrhage of anus and rectum: Secondary | ICD-10-CM | POA: Insufficient documentation

## 2020-12-26 DIAGNOSIS — R509 Fever, unspecified: Secondary | ICD-10-CM | POA: Insufficient documentation

## 2020-12-26 NOTE — ED Triage Notes (Signed)
Patient reports N/V/Dx16hrs yesterday, fever 102F at home, body aches, left sided abd pain Rectal bleeding this am

## 2021-01-05 ENCOUNTER — Emergency Department (HOSPITAL_COMMUNITY)
Admission: EM | Admit: 2021-01-05 | Discharge: 2021-01-06 | Disposition: A | Discharge status: Home / Self Care | Payer: Medicaid Other | Attending: Emergency Medicine | Admitting: Emergency Medicine

## 2021-01-05 ENCOUNTER — Emergency Department (HOSPITAL_COMMUNITY): Payer: Medicaid Other

## 2021-01-05 DIAGNOSIS — S0081XA Abrasion of other part of head, initial encounter: Secondary | ICD-10-CM | POA: Insufficient documentation

## 2021-01-05 DIAGNOSIS — S022XXA Fracture of nasal bones, initial encounter for closed fracture: Secondary | ICD-10-CM | POA: Diagnosis not present

## 2021-01-05 DIAGNOSIS — Z87891 Personal history of nicotine dependence: Secondary | ICD-10-CM | POA: Insufficient documentation

## 2021-01-05 DIAGNOSIS — Z23 Encounter for immunization: Secondary | ICD-10-CM | POA: Diagnosis not present

## 2021-01-05 DIAGNOSIS — J4520 Mild intermittent asthma, uncomplicated: Secondary | ICD-10-CM | POA: Insufficient documentation

## 2021-01-05 DIAGNOSIS — S0992XA Unspecified injury of nose, initial encounter: Secondary | ICD-10-CM | POA: Diagnosis present
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
## 2021-01-06 MED ORDER — TETANUS-DIPHTH-ACELL PERTUSSIS 5-2.5-18.5 LF-MCG/0.5 IM SUSY
0.5000 mL | PREFILLED_SYRINGE | Freq: Once | INTRAMUSCULAR | Status: AC
  Administered 2021-01-06: 0.5 mL via INTRAMUSCULAR
  Filled 2021-01-06: qty 0.5

## 2021-01-06 MED ORDER — HYDROCODONE-ACETAMINOPHEN 5-325 MG PO TABS: 1.0000 | ORAL_TABLET | ORAL | 0 refills | Status: DC | PRN
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
## 2021-01-06 NOTE — Discharge Instructions (Addendum)
Take the prescribed medication as directed.  Can take motrin with this if needed but avoid taking extra tylenol. Follow-up with ENT-- call for appt in the morning. Try to avoid blowing nose for now.  Can sleep with head of bed elevated or on pillows to help with breathing.  If nosebleed recurs, see attached for management recommendations. Return to the ED for new or worsening symptoms.

## 2021-04-07 ENCOUNTER — Encounter (HOSPITAL_COMMUNITY): Payer: Self-pay | Admitting: *Deleted

## 2021-04-07 ENCOUNTER — Other Ambulatory Visit: Payer: Self-pay

## 2021-04-07 ENCOUNTER — Emergency Department (HOSPITAL_COMMUNITY)
Admission: EM | Admit: 2021-04-07 | Discharge: 2021-04-07 | Disposition: A | Discharge status: Home / Self Care | Payer: Medicaid Other | Attending: Emergency Medicine | Admitting: Emergency Medicine

## 2021-04-07 DIAGNOSIS — L02214 Cutaneous abscess of groin: Secondary | ICD-10-CM | POA: Diagnosis present

## 2021-04-07 DIAGNOSIS — J4520 Mild intermittent asthma, uncomplicated: Secondary | ICD-10-CM | POA: Diagnosis not present

## 2021-04-07 DIAGNOSIS — L0291 Cutaneous abscess, unspecified: Secondary | ICD-10-CM

## 2021-04-07 DIAGNOSIS — Z87891 Personal history of nicotine dependence: Secondary | ICD-10-CM | POA: Diagnosis not present

## 2021-04-07 MED ORDER — LIDOCAINE-EPINEPHRINE (PF) 2 %-1:200000 IJ SOLN
INTRAMUSCULAR | Status: AC
  Administered 2021-04-07: 10 mL
  Filled 2021-04-07: qty 20

## 2021-04-07 MED ORDER — LIDOCAINE-EPINEPHRINE (PF) 2 %-1:200000 IJ SOLN: 10.0000 mL | Freq: Once | INTRAMUSCULAR | Status: AC

## 2021-04-07 NOTE — ED Provider Notes (Signed)
Emergency Medicine Provider Triage Evaluation Note  Audrey Pearson , a 30 y.o. female  was evaluated in triage.  Pt complains of skin infection over her pubic area, which she describes as an ingrown hair x 5 days. Seen at [facility], started on doxycyline yesterday, some pus drained from the area yesterday which gave her some relief.  Presents due to worsening pain today after bumping it at work.  Review of Systems  Positive: Swelling, pain, redness, puslike drainage from area over her pubic bone Negative: Fevers at home, chills, nausea, vomiting, diarrhea, dysuria, urinary frequency, vaginal bleeding or discharge  Physical Exam  BP (!) 104/55 (BP Location: Left Arm)   Pulse 77   Temp 98.8 F (37.1 C) (Oral)   Resp 18   Ht 5\' 5"  (1.651 m)   Wt 60.3 kg   LMP 04/04/2021   SpO2 100%   BMI 22.12 kg/m  Gen:   Awake, no distress   Resp:  Normal effort  MSK:   Moves extremities without difficulty  Other:  4 cm area of induration with central scab over the right side of the pubis with tenderness palpation.  Medical Decision Making  Medically screening exam initiated at 9:52 PM.  Appropriate orders placed.  CLOTIEL TROOP was informed that the remainder of the evaluation will be completed by another provider, this initial triage assessment does not replace that evaluation, and the importance of remaining in the ED until their evaluation is complete.  Given lack of systemic symptoms and normal vitals at this time.  Do not feel laboratory studies are warranted.  Recommend bedside ultrasound evaluate for possible I&D.   This chart was dictated using voice recognition software, Dragon. Despite the best efforts of this provider to proofread and correct errors, errors may still occur which can change documentation meaning.    Urbano Heir 04/07/21 2202    [id number], MD 04/07/21 2214
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
## 2021-04-07 NOTE — ED Triage Notes (Signed)
The pt is c/o an abscess inside her vagina for one week  with a temp  no previous history  lmp 2 days ago

## 2021-05-12 ENCOUNTER — Other Ambulatory Visit: Payer: Self-pay

## 2021-05-12 ENCOUNTER — Ambulatory Visit (INDEPENDENT_AMBULATORY_CARE_PROVIDER_SITE_OTHER): Payer: Medicaid Other

## 2021-05-12 ENCOUNTER — Other Ambulatory Visit (HOSPITAL_COMMUNITY)
Admission: RE | Admit: 2021-05-12 | Discharge: 2021-05-12 | Disposition: A | Discharge status: Home / Self Care | Payer: Medicaid Other | Source: Ambulatory Visit | Attending: Obstetrics & Gynecology | Admitting: Obstetrics & Gynecology

## 2021-05-12 DIAGNOSIS — Z113 Encounter for screening for infections with a predominantly sexual mode of transmission: Secondary | ICD-10-CM

## 2021-05-12 DIAGNOSIS — N898 Other specified noninflammatory disorders of vagina: Secondary | ICD-10-CM | POA: Diagnosis not present

## 2021-05-12 LAB — POCT URINALYSIS DIPSTICK
Bilirubin, UA: NEGATIVE
Blood, UA: NEGATIVE
Glucose, UA: NEGATIVE
Ketones, UA: NEGATIVE
Leukocytes, UA: NEGATIVE
Nitrite, UA: NEGATIVE
Protein, UA: NEGATIVE
Spec Grav, UA: 1.01 (ref 1.010–1.025)
Urobilinogen, UA: 0.2 E.U./dL
pH, UA: 7 (ref 5.0–8.0)

## 2021-05-12 NOTE — Progress Notes (Signed)
SUBJECTIVE:  30 y.o. female complains of white, milky, and thick vaginal discharge and irritation while voiding for 4 day(s). Denies abnormal vaginal bleeding or significant pelvic pain or fever. No UTI symptoms. Pt requests all STD testing.   OBJECTIVE:  She appears well, afebrile. Urine dipstick: negative for all components.  ASSESSMENT:  Vaginal Discharge     PLAN:  GC, chlamydia, trichomonas, BVAG, CVAG probe sent to lab. Treatment: To be determined once lab results are received ROV prn if symptoms persist or worsen.

## 2021-05-13 LAB — HEPATITIS C ANTIBODY: Hep C Virus Ab: <0.1 s/co ratio (ref 0.0–0.9)

## 2021-05-13 LAB — HEPATITIS B SURFACE ANTIGEN: Hepatitis B Surface Ag: NEGATIVE

## 2021-05-13 LAB — HIV ANTIBODY (ROUTINE TESTING W REFLEX): HIV Screen 4th Generation wRfx: NONREACTIVE

## 2021-05-13 LAB — RPR: RPR Ser Ql: NONREACTIVE

## 2021-05-16 LAB — CERVICOVAGINAL ANCILLARY ONLY
Bacterial Vaginitis (gardnerella): NEGATIVE
Candida Glabrata: NEGATIVE
Candida Vaginitis: NEGATIVE
Chlamydia: NEGATIVE
Comment: NEGATIVE
Comment: NEGATIVE
Comment: NEGATIVE
Comment: NEGATIVE
Comment: NEGATIVE
Comment: NORMAL
Neisseria Gonorrhea: NEGATIVE
Trichomonas: NEGATIVE

## 2021-05-17 NOTE — Progress Notes (Signed)
Patient was assessed and managed by nursing staff during this encounter. I have reviewed the chart and agree with the documentation and plan. I have also made any necessary editorial changes.  Emeterio Reeve, MD 05/17/2021 10:35 AM

## 2021-06-09 ENCOUNTER — Encounter: Payer: Medicaid Other | Admitting: Obstetrics
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
## 2021-11-04 ENCOUNTER — Emergency Department (HOSPITAL_COMMUNITY)
Admission: EM | Admit: 2021-11-04 | Discharge: 2021-11-05 | Disposition: A | Discharge status: Home / Self Care | Payer: Medicaid Other | Attending: Emergency Medicine | Admitting: Emergency Medicine

## 2021-11-04 ENCOUNTER — Emergency Department (HOSPITAL_COMMUNITY): Payer: Medicaid Other

## 2021-11-04 DIAGNOSIS — F10920 Alcohol use, unspecified with intoxication, uncomplicated: Secondary | ICD-10-CM

## 2021-11-04 DIAGNOSIS — R4182 Altered mental status, unspecified: Secondary | ICD-10-CM | POA: Diagnosis not present

## 2021-11-04 DIAGNOSIS — Y908 Blood alcohol level of 240 mg/100 ml or more: Secondary | ICD-10-CM | POA: Diagnosis not present

## 2021-11-04 DIAGNOSIS — F10120 Alcohol abuse with intoxication, uncomplicated: Secondary | ICD-10-CM | POA: Insufficient documentation

## 2021-11-04 LAB — COMPREHENSIVE METABOLIC PANEL
ALT: 11 U/L (ref 0–44)
AST: 24 U/L (ref 15–41)
Albumin: 3.4 g/dL — ABNORMAL LOW (ref 3.5–5.0)
Alkaline Phosphatase: 45 U/L (ref 38–126)
Anion gap: 10 (ref 5–15)
BUN: <5 mg/dL — ABNORMAL LOW (ref 6–20)
CO2: 18 mmol/L — ABNORMAL LOW (ref 22–32)
Calcium: 8.1 mg/dL — ABNORMAL LOW (ref 8.9–10.3)
Chloride: 118 mmol/L — ABNORMAL HIGH (ref 98–111)
Creatinine, Ser: 0.63 mg/dL (ref 0.44–1.00)
GFR, Estimated: >60 mL/min (ref >60)
Glucose, Bld: 99 mg/dL (ref 70–99)
Potassium: 3.8 mmol/L (ref 3.5–5.1)
Sodium: 146 mmol/L — ABNORMAL HIGH (ref 135–145)
Total Bilirubin: 0.5 mg/dL (ref 0.3–1.2)
Total Protein: 5.6 g/dL — ABNORMAL LOW (ref 6.5–8.1)

## 2021-11-04 LAB — CBC WITH DIFFERENTIAL/PLATELET
Abs Immature Granulocytes: 0.02 10*3/uL (ref 0.00–0.07)
Basophils Absolute: 0.1 10*3/uL (ref 0.0–0.1)
Basophils Relative: 1 %
Eosinophils Absolute: 0 10*3/uL (ref 0.0–0.5)
Eosinophils Relative: 0 %
HCT: 34.7 % — ABNORMAL LOW (ref 36.0–46.0)
Hemoglobin: 11.3 g/dL — ABNORMAL LOW (ref 12.0–15.0)
Immature Granulocytes: 0 %
Lymphocytes Relative: 22 %
Lymphs Abs: 1.2 10*3/uL (ref 0.7–4.0)
MCH: 30.6 pg (ref 26.0–34.0)
MCHC: 32.6 g/dL (ref 30.0–36.0)
MCV: 94 fL (ref 80.0–100.0)
Monocytes Absolute: 0.1 10*3/uL (ref 0.1–1.0)
Monocytes Relative: 2 %
Neutro Abs: 4.1 10*3/uL (ref 1.7–7.7)
Neutrophils Relative %: 75 %
Platelets: 151 10*3/uL (ref 150–400)
RBC: 3.69 MIL/uL — ABNORMAL LOW (ref 3.87–5.11)
RDW: 13.6 % (ref 11.5–15.5)
WBC: 5.6 10*3/uL (ref 4.0–10.5)
nRBC: 0 % (ref 0.0–0.2)

## 2021-11-04 LAB — ACETAMINOPHEN LEVEL: Acetaminophen (Tylenol), Serum: <10 ug/mL — ABNORMAL LOW (ref 10–30)

## 2021-11-04 LAB — ETHANOL: Alcohol, Ethyl (B): 241 mg/dL — ABNORMAL HIGH (ref <10)

## 2021-11-04 LAB — SALICYLATE LEVEL: Salicylate Lvl: <7 mg/dL — ABNORMAL LOW (ref 7.0–30.0)

## 2021-11-04 MED ORDER — NALOXONE HCL 2 MG/2ML IJ SOSY: 1.0000 mg | PREFILLED_SYRINGE | Freq: Once | INTRAMUSCULAR | Status: AC

## 2021-11-04 MED ORDER — LACTATED RINGERS IV BOLUS
1000.0000 mL | Freq: Once | INTRAVENOUS | Status: AC
  Administered 2021-11-04: 1000 mL via INTRAVENOUS

## 2021-11-04 MED ORDER — ONDANSETRON HCL 4 MG/2ML IJ SOLN: 4.0000 mg | Freq: Once | INTRAMUSCULAR | Status: AC

## 2021-11-04 MED ORDER — ONDANSETRON HCL 4 MG/2ML IJ SOLN
INTRAMUSCULAR | Status: AC
  Administered 2021-11-04: 4 mg via INTRAVENOUS
  Filled 2021-11-04: qty 2

## 2021-11-04 MED ORDER — NALOXONE HCL 2 MG/2ML IJ SOSY
PREFILLED_SYRINGE | INTRAMUSCULAR | Status: AC
  Administered 2021-11-04: 1 mg via INTRAVENOUS
  Filled 2021-11-04: qty 2

## 2022-05-14 NOTE — L&D Delivery Note (Addendum)
Delivery Note Called for urgent delivery (other provider in another delivery).  At 7:23 AM a viable and healthy female was delivered via Vaginal, Spontaneous (Presentation:      ).  APGAR: 6, 9; weight  .   Placenta status: Spontaneous, Intact.  Cord: 3 vessels with the following complications: Tight Nuchal Cord   After head delivered easily, baby did not rotate fully.  Attempted to grasp axilla, unable  Mc[place]ts employed by RNs, SP pressure applied.  Rubins done and was able to grasp posterior axilla and partially deliver   Then delivered both shoulders by rocking side to side.  Handed to waiting Neo Team.    Anesthesia: Epidural Episiotomy: None Lacerations: None Suture Repair:  none Est. Blood Loss (mL):  177  Mom to postpartum.  Baby to Couplet care / Skin to Skin.  Wynelle Bourgeois 04/08/2023, 8:07 AM

## 2022-08-17 ENCOUNTER — Telehealth: Payer: Self-pay

## 2022-08-17 NOTE — Telephone Encounter (Signed)
Patient called after hours stating that she has been having severe abdominal pain after eating x 3 weeks. Patient was advised to go to ED right away. Called patient to follow up. Patient states that she will go to be evaluated after she gets off of work at [employer].

## 2022-08-18 ENCOUNTER — Inpatient Hospital Stay (HOSPITAL_COMMUNITY): Payer: Medicaid Other

## 2022-08-18 ENCOUNTER — Inpatient Hospital Stay (HOSPITAL_COMMUNITY)
Admission: EM | Admit: 2022-08-18 | Discharge: 2022-08-18 | Disposition: A | Discharge status: Home / Self Care | Payer: Medicaid Other | Attending: Obstetrics and Gynecology | Admitting: Obstetrics and Gynecology

## 2022-08-18 ENCOUNTER — Encounter (HOSPITAL_COMMUNITY): Payer: Self-pay

## 2022-08-18 DIAGNOSIS — K581 Irritable bowel syndrome with constipation: Secondary | ICD-10-CM

## 2022-08-18 DIAGNOSIS — K5900 Constipation, unspecified: Secondary | ICD-10-CM | POA: Insufficient documentation

## 2022-08-18 DIAGNOSIS — R12 Heartburn: Secondary | ICD-10-CM | POA: Diagnosis not present

## 2022-08-18 DIAGNOSIS — Z3A01 Less than 8 weeks gestation of pregnancy: Secondary | ICD-10-CM | POA: Insufficient documentation

## 2022-08-18 DIAGNOSIS — O219 Vomiting of pregnancy, unspecified: Secondary | ICD-10-CM | POA: Insufficient documentation

## 2022-08-18 DIAGNOSIS — O26891 Other specified pregnancy related conditions, first trimester: Secondary | ICD-10-CM

## 2022-08-18 DIAGNOSIS — O208 Other hemorrhage in early pregnancy: Secondary | ICD-10-CM | POA: Insufficient documentation

## 2022-08-18 DIAGNOSIS — R1011 Right upper quadrant pain: Secondary | ICD-10-CM | POA: Insufficient documentation

## 2022-08-18 DIAGNOSIS — O98811 Other maternal infectious and parasitic diseases complicating pregnancy, first trimester: Secondary | ICD-10-CM | POA: Insufficient documentation

## 2022-08-18 DIAGNOSIS — O23592 Infection of other part of genital tract in pregnancy, second trimester: Secondary | ICD-10-CM | POA: Insufficient documentation

## 2022-08-18 DIAGNOSIS — B3731 Acute candidiasis of vulva and vagina: Secondary | ICD-10-CM | POA: Insufficient documentation

## 2022-08-18 DIAGNOSIS — O99611 Diseases of the digestive system complicating pregnancy, first trimester: Secondary | ICD-10-CM | POA: Insufficient documentation

## 2022-08-18 LAB — COMPREHENSIVE METABOLIC PANEL
ALT: 11 U/L (ref 0–44)
AST: 15 U/L (ref 15–41)
Albumin: 4.1 g/dL (ref 3.5–5.0)
Alkaline Phosphatase: 54 U/L (ref 38–126)
Anion gap: 15 (ref 5–15)
BUN: 5 mg/dL — ABNORMAL LOW (ref 6–20)
CO2: 20 mmol/L — ABNORMAL LOW (ref 22–32)
Calcium: 9.6 mg/dL (ref 8.9–10.3)
Chloride: 102 mmol/L (ref 98–111)
Creatinine, Ser: 0.72 mg/dL (ref 0.44–1.00)
GFR, Estimated: >60 mL/min (ref >60)
Glucose, Bld: 89 mg/dL (ref 70–99)
Potassium: 3.8 mmol/L (ref 3.5–5.1)
Sodium: 137 mmol/L (ref 135–145)
Total Bilirubin: 0.9 mg/dL (ref 0.3–1.2)
Total Protein: 6.9 g/dL (ref 6.5–8.1)

## 2022-08-18 LAB — CBC
HCT: 38.7 % (ref 36.0–46.0)
Hemoglobin: 13.2 g/dL (ref 12.0–15.0)
MCH: 29.1 pg (ref 26.0–34.0)
MCHC: 34.1 g/dL (ref 30.0–36.0)
MCV: 85.4 fL (ref 80.0–100.0)
Platelets: 228 10*3/uL (ref 150–400)
RBC: 4.53 MIL/uL (ref 3.87–5.11)
RDW: 13.4 % (ref 11.5–15.5)
WBC: 13.4 10*3/uL — ABNORMAL HIGH (ref 4.0–10.5)
nRBC: 0 % (ref 0.0–0.2)

## 2022-08-18 LAB — URINALYSIS, ROUTINE W REFLEX MICROSCOPIC
Bilirubin Urine: NEGATIVE
Glucose, UA: NEGATIVE mg/dL
Hgb urine dipstick: NEGATIVE
Ketones, ur: 80 mg/dL — AB
Leukocytes,Ua: NEGATIVE
Nitrite: NEGATIVE
Protein, ur: NEGATIVE mg/dL
Specific Gravity, Urine: 1.018 (ref 1.005–1.030)
pH: 5 (ref 5.0–8.0)

## 2022-08-18 LAB — WET PREP, GENITAL
Clue Cells Wet Prep HPF POC: NONE SEEN
Sperm: NONE SEEN
Trich, Wet Prep: NONE SEEN
WBC, Wet Prep HPF POC: >=10 — AB (ref <10)

## 2022-08-18 LAB — AMYLASE: Amylase: 80 U/L (ref 28–100)

## 2022-08-18 LAB — HIV ANTIBODY (ROUTINE TESTING W REFLEX): HIV Screen 4th Generation wRfx: NONREACTIVE

## 2022-08-18 LAB — POCT PREGNANCY, URINE: Preg Test, Ur: POSITIVE — AB

## 2022-08-18 LAB — LIPASE, BLOOD: Lipase: 25 U/L (ref 11–51)

## 2022-08-18 LAB — HCG, QUANTITATIVE, PREGNANCY: hCG, Beta Chain, Quant, S: 33542 m[IU]/mL — ABNORMAL HIGH (ref <5)

## 2022-08-18 MED ORDER — ALUM & MAG HYDROXIDE-SIMETH 200-200-20 MG/5ML PO SUSP
30.0000 mL | Freq: Once | ORAL | Status: AC
  Administered 2022-08-18: 30 mL via ORAL
  Filled 2022-08-18: qty 30

## 2022-08-18 MED ORDER — PROMETHAZINE HCL 25 MG/ML IJ SOLN
25.0000 mg | Freq: Once | INTRAVENOUS | Status: AC
  Administered 2022-08-18: 25 mg via INTRAVENOUS
  Filled 2022-08-18: qty 1

## 2022-08-18 MED ORDER — SCOPOLAMINE 1 MG/3DAYS TD PT72
1.0000 | MEDICATED_PATCH | Freq: Once | TRANSDERMAL | Status: DC
  Administered 2022-08-18: 1.5 mg via TRANSDERMAL
  Filled 2022-08-18: qty 1

## 2022-08-18 MED ORDER — FAMOTIDINE IN NACL 20-0.9 MG/50ML-% IV SOLN
20.0000 mg | Freq: Once | INTRAVENOUS | Status: AC
  Administered 2022-08-18: 20 mg via INTRAVENOUS
  Filled 2022-08-18: qty 50

## 2022-08-18 MED ORDER — FAMOTIDINE 20 MG PO TABS: 20.0000 mg | ORAL_TABLET | Freq: Two times a day (BID) | ORAL | 0 refills | Status: DC

## 2022-08-18 MED ORDER — SCOPOLAMINE 1 MG/3DAYS TD PT72: 1.0000 | MEDICATED_PATCH | TRANSDERMAL | 2 refills | Status: DC

## 2022-08-18 MED ORDER — DICYCLOMINE HCL 10 MG/5ML PO SOLN
10.0000 mg | Freq: Once | ORAL | Status: AC
  Administered 2022-08-18: 10 mg via ORAL
  Filled 2022-08-18: qty 5

## 2022-08-18 MED ORDER — TERCONAZOLE 0.4 % VA CREA: 1.0000 | TOPICAL_CREAM | Freq: Every day | VAGINAL | 0 refills | Status: DC

## 2022-08-18 MED ORDER — LACTATED RINGERS IV BOLUS
1000.0000 mL | Freq: Once | INTRAVENOUS | Status: AC
  Administered 2022-08-18: 1000 mL via INTRAVENOUS

## 2022-08-18 MED ORDER — PROMETHAZINE HCL 25 MG PO TABS: 25.0000 mg | ORAL_TABLET | Freq: Four times a day (QID) | ORAL | 2 refills | Status: DC | PRN

## 2022-08-18 MED ORDER — LACTATED RINGERS IV SOLN
Freq: Once | INTRAVENOUS | Status: AC
  Filled 2022-08-18: qty 1000

## 2022-08-18 NOTE — MAU Provider Note (Addendum)
History     CSN: [id number]  Arrival date and time: 08/18/22 1424   Event Date/Time   First Provider Initiated Contact with Patient 08/18/22 1616      Chief Complaint  Patient presents with   Abdominal Pain   Nausea   Emesis   Audrey Pearson is a 32 y.o. year old [id number] female at [redacted]w[redacted]d weeks gestation by LMP who presents to MAU reporting she started vomiting this morning and has been unable to keep anything down today. She reports feeling lower abdominal pain "like a period" for 2 weeks, but that changed to being upper abdomen yesterday (08/17/22). She rates the pain as 10/10. Her pregnancy has not been confirmed at this time. She has chronic IBS and was seen at Urgent Care yesterday. She was told by the provider there was the abdominal pain was a "flare up" of the IBS. She receives [facility] with [facility]; next appt is 09/03/2022.    OB History     Gravida  5   Para  2   Term  2   Preterm  0   AB  1   Living  2      SAB  1   IAB  0   Ectopic  0   Multiple  0   Live Births  2           Past Medical History:  Diagnosis Date   Anxiety    Asthma    Chronic constipation    Depression    IBS (irritable bowel syndrome)     Past Surgical History:  Procedure Laterality Date   NO PAST SURGERIES     WISDOM TOOTH EXTRACTION  01/2019    Family History  Adopted: Yes  Problem Relation Age of Onset   Breast cancer Mother    Alcohol abuse Mother    Drug abuse Mother    Alcohol abuse Father    Drug abuse Father    Sickle cell anemia Sister    Drug abuse Brother     Social History   Tobacco Use   Smoking status: Former    Types: Cigarettes    Quit date: 2019    Years since quitting: 5.2   Smokeless tobacco: Never  Vaping Use   Vaping Use: Former   Substances: Flavoring   Devices: Last used last week  Substance Use Topics   Alcohol use: Not Currently    Comment: not since confirmed pregnancy   Drug use: Not Currently    Types: Marijuana     Allergies: No Known Allergies  Medications Prior to Admission  Medication Sig Dispense Refill Last Dose   albuterol (PROVENTIL HFA;VENTOLIN HFA) 108 (90 Base) MCG/ACT inhaler Inhale 2 puffs into the lungs every 4 (four) hours as needed for wheezing or shortness of breath.   Past Week   Albuterol-Budesonide (AIRSUPRA) 90-80 MCG/ACT AERO Inhale into the lungs.      simethicone (MYLICON) 80 MG chewable tablet Chew by mouth.      dicyclomine (BENTYL) 20 MG tablet Take 1 tablet by mouth 2 (two) times daily.      sertraline (ZOLOFT) 50 MG tablet Take 3 tablets (150 mg total) by mouth daily. 90 tablet 3     Review of Systems  Constitutional: Negative.   HENT: Negative.    Eyes: Negative.   Respiratory: Negative.    Cardiovascular: Negative.   Gastrointestinal:  Positive for abdominal pain (upper), nausea and vomiting.  Endocrine: Negative.   Genitourinary: Negative.  Musculoskeletal: Negative.   Skin: Negative.   Allergic/Immunologic: Negative.   Neurological: Negative.   Hematological: Negative.   Psychiatric/Behavioral: Negative.     Physical Exam   Blood pressure (!) 105/53, pulse (!) 104, temperature 98.4 F (36.9 C), temperature source Oral, resp. rate 18, height 5\' 5"  (1.651 m), weight 70.1 kg, last menstrual period 07/08/2022, SpO2 100 %, unknown if currently breastfeeding.  Physical Exam Vitals and nursing note reviewed.  Constitutional:      Appearance: Normal appearance. She is normal weight.  Cardiovascular:     Rate and Rhythm: Normal rate.  Pulmonary:     Effort: Pulmonary effort is normal.  Abdominal:     General: Abdomen is flat.     Palpations: Abdomen is soft.     Tenderness: There is abdominal tenderness (mild).  Genitourinary:    Comments: Swabs by self-collection Musculoskeletal:        General: Normal range of motion.  Skin:    General: Skin is warm and dry.  Neurological:     Mental Status: She is alert and oriented to person, place, and time.   Psychiatric:        Mood and Affect: Mood normal.        Behavior: Behavior normal.        Thought Content: Thought content normal.        Judgment: Judgment normal.    Reassessment @ 1930: upper abdominal pain returning and "wrapping around her back". Liver/GB U/S ordered.  MAU Course  Procedures  MDM CCUA UPT CBC CMP Amylase Lipase ABO/Rh -- not drawn d/t known O Positive HCG Wet Prep GC/CT -- pending HIV -- pending OB < 14 wks [place] with TV IVFs: Phenergan 25 mg in LR 1000 ml @ 999 ml/hr  -- resolved nausea/vomiting MVI 1 ampule in LR 1000 ml @ 999 ml/hr Scopolamine Patch x 1 Pepcid 20 mg IVPB   Results for orders placed or performed during the hospital encounter of 08/18/22 (from the past 24 hour(s))  Urinalysis, Routine w reflex microscopic -Urine, Clean Catch     Status: Abnormal   Collection Time: 08/18/22  3:47 PM  Result Value Ref Range   Color, Urine YELLOW YELLOW   APPearance CLEAR CLEAR   Specific Gravity, Urine 1.018 1.005 - 1.030   pH 5.0 5.0 - 8.0   Glucose, UA NEGATIVE NEGATIVE mg/dL   Hgb urine dipstick NEGATIVE NEGATIVE   Bilirubin Urine NEGATIVE NEGATIVE   Ketones, ur 80 (A) NEGATIVE mg/dL   Protein, ur NEGATIVE NEGATIVE mg/dL   Nitrite NEGATIVE NEGATIVE   Leukocytes,Ua NEGATIVE NEGATIVE  Pregnancy, urine POC     Status: Abnormal   Collection Time: 08/18/22  3:49 PM  Result Value Ref Range   Preg Test, Ur POSITIVE (A) NEGATIVE  Wet prep, genital     Status: Abnormal   Collection Time: 08/18/22  3:53 PM  Result Value Ref Range   Yeast Wet Prep HPF POC PRESENT (A) NONE SEEN   Trich, Wet Prep NONE SEEN NONE SEEN   Clue Cells Wet Prep HPF POC NONE SEEN NONE SEEN   WBC, Wet Prep HPF POC >=10 (A) <10   Sperm NONE SEEN   CBC     Status: Abnormal   Collection Time: 08/18/22  4:28 PM  Result Value Ref Range   WBC 13.4 (H) 4.0 - 10.5 K/uL   RBC 4.53 3.87 - 5.11 MIL/uL   Hemoglobin 13.2 12.0 - 15.0 g/dL   HCT 51.8 84.1 - 66.0 %  MCV 85.4 80.0 -  100.0 fL   MCH 29.1 26.0 - 34.0 pg   MCHC 34.1 30.0 - 36.0 g/dL   RDW 16.113.4 09.611.5 - 04.515.5 %   Platelets 228 150 - 400 K/uL   nRBC 0.0 0.0 - 0.2 %  hCG, quantitative, pregnancy     Status: Abnormal   Collection Time: 08/18/22  4:28 PM  Result Value Ref Range   hCG, Beta Chain, Quant, S 33,542 (H) <5 mIU/mL  HIV Antibody (routine testing w rflx)     Status: None   Collection Time: 08/18/22  4:28 PM  Result Value Ref Range   HIV Screen 4th Generation wRfx Non Reactive Non Reactive  Comprehensive metabolic panel     Status: Abnormal   Collection Time: 08/18/22  4:28 PM  Result Value Ref Range   Sodium 137 135 - 145 mmol/L   Potassium 3.8 3.5 - 5.1 mmol/L   Chloride 102 98 - 111 mmol/L   CO2 20 (L) 22 - 32 mmol/L   Glucose, Bld 89 70 - 99 mg/dL   BUN 5 (L) 6 - 20 mg/dL   Creatinine, Ser 4.090.72 0.44 - 1.00 mg/dL   Calcium 9.6 8.9 - 81.110.3 mg/dL   Total Protein 6.9 6.5 - 8.1 g/dL   Albumin 4.1 3.5 - 5.0 g/dL   AST 15 15 - 41 U/L   ALT 11 0 - 44 U/L   Alkaline Phosphatase 54 38 - 126 U/L   Total Bilirubin 0.9 0.3 - 1.2 mg/dL   GFR, Estimated >91>60 >47>60 mL/min   Anion gap 15 5 - 15  Lipase, blood     Status: None   Collection Time: 08/18/22  4:28 PM  Result Value Ref Range   Lipase 25 11 - 51 U/L  Amylase     Status: None   Collection Time: 08/18/22  4:28 PM  Result Value Ref Range   Amylase 80 28 - 100 U/L    US ABDOMEN LIMITED RUQ (LIVER/GB)  Result Date: 08/18/2022 CLINICAL DATA:  Abdominal pain EXAM: ULTRASOUND ABDOMEN LIMITED RIGHT UPPER QUADRANT COMPARISON:  None Available. FINDINGS: Gallbladder: No gallstones or wall thickening visualized. No sonographic Murphy sign noted by sonographer. Common bile duct: Diameter: 3 mm.  No intrahepatic biliary dilation. Liver: No focal lesion identified. Within normal limits in parenchymal echogenicity. Portal vein is patent on color Doppler imaging with normal direction of blood flow towards the liver. Other: None. IMPRESSION: Unremarkable right  upper quadrant ultrasound. Electronically Signed   By: Minerva Festeryler  Stutzman M.D.   On: 08/18/2022 20:14   US OB LESS THAN 14 WEEKS WITH OB TRANSVAGINAL  Result Date: 08/18/2022 CLINICAL DATA:  Abdominal and pelvic pain for 2 weeks. First trimester pregnancy. EXAM: OBSTETRIC <14 WK US AND TRANSVAGINAL OB US TECHNIQUE: Both transabdominal and transvaginal ultrasound examinations were performed for complete evaluation of the gestation as well as the maternal uterus, adnexal regions, and pelvic cul-de-sac. Transvaginal technique was performed to assess early pregnancy. COMPARISON:  None Available. FINDINGS: Intrauterine gestational sac: Single Yolk sac:  Visualized. Embryo:  Visualized. Cardiac Activity: Visualized. Heart Rate: 115 bpm CRL:  4 mm   6 w   1 d                  US EDC: 04/12/2023 Subchorionic hemorrhage: Small to moderate subchorionic hemorrhage is seen. Maternal uterus/adnexae: Both ovaries are normal in appearance. No mass or abnormal free fluid identified. IMPRESSION: Single living IUP with estimated gestational age of [redacted] weeks  1 day, and US EDC of 04/12/2023. Small to moderate subchorionic hemorrhage. Electronically Signed   By: Danae OrleansJohn A Stahl M.D.   On: 08/18/2022 17:43     Report given to and care assumed by Audrey HornErin Mahathi Pokorney, Audrey Pearson @ [address]  Audrey Pearson, Audrey Pearson. Audrey Pearson 08/18/2022, 4:21 PM    Patient reports improvement in symptoms after GI cocktail & bentyl. Reviewed results of imaging & labs with patient.   Assessment and Plan   1. Nausea and vomiting during pregnancy  -Rx scopolamine & phenergan  2. Heartburn during pregnancy in first trimester  -Rx pepcid bid  3. Irritable bowel syndrome with constipation  -Will take supplements to OB appointment to confirm safety during pregnancy (patient reports 5 different supplements she takes but unsure what they all are).  -encouraged to start daily stool softeners & can take laxatives as needed  4. Vaginal yeast infection  -Patient asymptomatic but wet  prep positive. Rx Terazol to use if symptoms present.   5. [redacted] weeks gestation of pregnancy  -Keep scheduled OB appointment    Audrey Pearson, Audrey Pennisi, Audrey Pearson  08/18/2022 9:12 PM

## 2022-08-18 NOTE — MAU Note (Signed)
Audrey Pearson is a 32 y.o. at [redacted]w[redacted]d here in MAU reporting: woke up this morning and started throwing, has not been able to keep anything down today.  Pregnancy has not been confirmed yet, not at [facility] yesterday or ER today.  Stomach has been hurting for 2 wks was feeling like her period was going to start. Yesterday switched to more pain in the upper abd. No bleeding. LMP: 2/25 Onset of complaint: worse and change yesterday Pain score: 10 Vitals:   08/18/22 1445  BP: (!) 112/56  Pulse: 83  Resp: 18  Temp: 99.4 F (37.4 C)  SpO2: 98%      Lab orders placed from triage:   Pt unable to sit still in triage, pt in tears.  Denies pelvic pressure.

## 2022-08-18 NOTE — Discharge Instructions (Signed)
You have constipation which is hard stools that are difficult to pass. It is important to have regular bowel movements every 1-3 days that are soft and easy to pass. Hard stools increase your risk of hemorrhoids and are very uncomfortable.   To prevent constipation you can increase the amount of fiber in your diet. Examples of foods with fiber are leafy greens, whole grain breads, oatmeal and other grains.  It is also important to drink at least 64 ounces of water everyday.   If you have not had a bowel movement in 4-5 days, you made need to clean out your bowel.  This will help you establish normal movements through your bowel.    Miralax Clean out Take 8 capfuls of miralax in 64 oz of gatorade. You can use any fluid that appeals to you (gatorade, water, juice) Continue to drink at least 64 ounces of water throughout the day You can repeat with another 8 capfuls of miralax in 64 oz of gatorade if you are not having a large amount of stools You will need to be at home and close to a bathroom for about 8 hours when you do the above as you may need to go to the bathroom frequently.   After you are cleaned out: - Start Colace100mg twice daily - Start Miralax once daily - Start a daily fiber supplement like metamucil or citrucel - You can safely use enemas in pregnancy  - if you are having diarrhea you can reduce to Colace once a day or miralax every other day or a 1/2 capful daily.   

## 2022-08-18 NOTE — MAU Note (Signed)
Pt reporting upper abdominal cramping is returning-and that the cramping goes into her back. Pepcid complete. multivitamin infused

## 2022-08-18 NOTE — ED Triage Notes (Addendum)
Pt c/o vomiting x 1 day; pt is [redacted] weeks pregnant; endorses abd pain; pt seent at urgent care yesterday, was told it was IBS flareup; denies diarrhea, endorses chills

## 2022-08-20 LAB — GC/CHLAMYDIA PROBE AMP (~~LOC~~) NOT AT ARMC
Chlamydia: NEGATIVE
Comment: NEGATIVE
Comment: NORMAL
Neisseria Gonorrhea: NEGATIVE

## 2022-09-03 ENCOUNTER — Ambulatory Visit (INDEPENDENT_AMBULATORY_CARE_PROVIDER_SITE_OTHER): Payer: Medicaid Other | Admitting: *Deleted

## 2022-09-03 VITALS — BP 113/71 | HR 85 | Wt 155.3 lb

## 2022-09-03 DIAGNOSIS — Z3480 Encounter for supervision of other normal pregnancy, unspecified trimester: Secondary | ICD-10-CM | POA: Insufficient documentation

## 2022-09-03 DIAGNOSIS — Z3481 Encounter for supervision of other normal pregnancy, first trimester: Secondary | ICD-10-CM | POA: Diagnosis not present

## 2022-09-03 DIAGNOSIS — Z3A08 8 weeks gestation of pregnancy: Secondary | ICD-10-CM | POA: Diagnosis not present

## 2022-09-03 MED ORDER — BLOOD PRESSURE KIT DEVI
1.0000 | 0 refills | Status: DC
Start: 2022-09-03 — End: 2022-09-12
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
## 2022-09-04 LAB — HEMOGLOBIN A1C
Est. average glucose Bld gHb Est-mCnc: 94 mg/dL
Hgb A1c MFr Bld: 4.9 % (ref 4.8–5.6)

## 2022-09-04 LAB — CBC/D/PLT+RPR+RH+ABO+RUBIGG...
Antibody Screen: NEGATIVE
Basophils Absolute: 0 10*3/uL (ref 0.0–0.2)
Basos: 0 %
EOS (ABSOLUTE): 0 10*3/uL (ref 0.0–0.4)
Eos: 0 %
HCV Ab: NONREACTIVE
HIV Screen 4th Generation wRfx: NONREACTIVE
Hematocrit: 36 % (ref 34.0–46.6)
Hemoglobin: 12.1 g/dL (ref 11.1–15.9)
Hepatitis B Surface Ag: NEGATIVE
Immature Grans (Abs): 0 10*3/uL (ref 0.0–0.1)
Immature Granulocytes: 0 %
Lymphocytes Absolute: 1.2 10*3/uL (ref 0.7–3.1)
Lymphs: 13 %
MCH: 29.1 pg (ref 26.6–33.0)
MCHC: 33.6 g/dL (ref 31.5–35.7)
MCV: 87 fL (ref 79–97)
Monocytes Absolute: 0.5 10*3/uL (ref 0.1–0.9)
Monocytes: 5 %
Neutrophils Absolute: 7.5 10*3/uL — ABNORMAL HIGH (ref 1.4–7.0)
Neutrophils: 82 %
Platelets: 208 10*3/uL (ref 150–450)
RBC: 4.16 x10E6/uL (ref 3.77–5.28)
RDW: 13.2 % (ref 11.7–15.4)
RPR Ser Ql: NONREACTIVE
Rh Factor: POSITIVE
Rubella Antibodies, IGG: 1.88 index (ref >0.99)
WBC: 9.2 10*3/uL (ref 3.4–10.8)

## 2022-09-04 LAB — HCV INTERPRETATION

## 2022-09-05 LAB — CULTURE, OB URINE

## 2022-09-05 LAB — URINE CULTURE, OB REFLEX: Organism ID, Bacteria: NO GROWTH

## 2022-09-12 ENCOUNTER — Other Ambulatory Visit: Payer: Self-pay

## 2022-09-12 DIAGNOSIS — Z3480 Encounter for supervision of other normal pregnancy, unspecified trimester: Secondary | ICD-10-CM

## 2022-09-12 MED ORDER — BLOOD PRESSURE KIT DEVI
1.0000 | 0 refills | Status: DC
Start: 2022-09-12 — End: 2023-04-10

## 2022-09-14 ENCOUNTER — Other Ambulatory Visit: Payer: Self-pay | Admitting: Student

## 2022-09-17 ENCOUNTER — Encounter: Payer: Self-pay | Admitting: Advanced Practice Midwife

## 2022-09-17 ENCOUNTER — Ambulatory Visit (INDEPENDENT_AMBULATORY_CARE_PROVIDER_SITE_OTHER): Payer: Medicaid Other | Admitting: Licensed Clinical Social Worker

## 2022-09-17 ENCOUNTER — Ambulatory Visit (INDEPENDENT_AMBULATORY_CARE_PROVIDER_SITE_OTHER): Payer: Medicaid Other | Admitting: Advanced Practice Midwife

## 2022-09-17 ENCOUNTER — Other Ambulatory Visit (HOSPITAL_COMMUNITY)
Admission: RE | Admit: 2022-09-17 | Discharge: 2022-09-17 | Disposition: A | Discharge status: Home / Self Care | Payer: Medicaid Other | Source: Ambulatory Visit | Attending: Advanced Practice Midwife | Admitting: Advanced Practice Midwife

## 2022-09-17 VITALS — BP 112/69 | HR 81 | Wt 152.6 lb

## 2022-09-17 DIAGNOSIS — Z3480 Encounter for supervision of other normal pregnancy, unspecified trimester: Secondary | ICD-10-CM | POA: Diagnosis not present

## 2022-09-17 DIAGNOSIS — Z124 Encounter for screening for malignant neoplasm of cervix: Secondary | ICD-10-CM

## 2022-09-17 DIAGNOSIS — Z3A10 10 weeks gestation of pregnancy: Secondary | ICD-10-CM

## 2022-09-17 DIAGNOSIS — Z87828 Personal history of other (healed) physical injury and trauma: Secondary | ICD-10-CM

## 2022-09-17 DIAGNOSIS — O99341 Other mental disorders complicating pregnancy, first trimester: Secondary | ICD-10-CM

## 2022-09-17 DIAGNOSIS — B977 Papillomavirus as the cause of diseases classified elsewhere: Secondary | ICD-10-CM | POA: Insufficient documentation

## 2022-09-17 DIAGNOSIS — D573 Sickle-cell trait: Secondary | ICD-10-CM

## 2022-09-17 DIAGNOSIS — O219 Vomiting of pregnancy, unspecified: Secondary | ICD-10-CM

## 2022-09-17 MED ORDER — ONDANSETRON HCL 4 MG PO TABS: 4.0000 mg | ORAL_TABLET | Freq: Three times a day (TID) | ORAL | 3 refills | Status: DC | PRN

## 2022-09-17 NOTE — Progress Notes (Signed)
Pt presents for ROB visit. [place] scheduled. No concerns.

## 2022-09-17 NOTE — Progress Notes (Signed)
INITIAL PRENATAL VISIT  Subjective:   Audrey Pearson is 32 y.o. [phone] female being seen today for her first obstetrical visit. She is not received other prenatal care previously this pregnancy. This is not a planned pregnancy. This is a desired pregnancy.  She is at [redacted]w[redacted]d gestation by LMP and 6 week [place]. Her obstetrical history is significant for group B strep colonizer. Relationship with FOB: spouse, living together. Patient does intend to breast feed. Pregnancy history fully reviewed.  Review of Systems:   ROS nausea and vomiting. Phenergan not helping.   Objective:    Obstetric History OB History  Gravida Para Term Preterm AB Living  5 2 2  0 2 2  SAB IAB Ectopic Multiple Live Births  2 0 0 0 2    # Outcome Date GA Lbr Len/2nd Weight Sex Delivery Anes PTL Lv  5 Current           4 Term 05/18/18 [redacted]w[redacted]d 21:06 / 00:25 6 lb 15.6 oz (3.165 kg) F Vag-Spont EPI  LIV  3 Term 12/20/16 [redacted]w[redacted]d 43:59 6 lb 10.7 oz (3.025 kg) F Vag-Spont EPI  LIV  2 SAB 06/2014          1 SAB             Past Medical History:  Diagnosis Date   Anxiety    Asthma    Depression    IBS (irritable bowel syndrome)    w/ chronic constipation    Past Surgical History:  Procedure Laterality Date   WISDOM TOOTH EXTRACTION  01/2019    Current Outpatient Medications on File Prior to Visit  Medication Sig Dispense Refill   albuterol (PROVENTIL HFA;VENTOLIN HFA) 108 (90 Base) MCG/ACT inhaler Inhale 2 puffs into the lungs every 4 (four) hours as needed for wheezing or shortness of breath.     Blood Pressure Monitoring (BLOOD PRESSURE KIT) DEVI 1 Device by Does not apply route once a week. 1 each 0   Albuterol-Budesonide (AIRSUPRA) 90-80 MCG/ACT AERO Inhale into the lungs. (Patient not taking: Reported on 09/03/2022)     dicyclomine (BENTYL) 20 MG tablet Take 1 tablet by mouth 2 (two) times daily. (Patient not taking: Reported on 09/03/2022)     famotidine (PEPCID) 20 MG tablet Take 1 tablet (20 mg total) by mouth  2 (two) times daily. (Patient not taking: Reported on 09/17/2022) 60 tablet 0   promethazine (PHENERGAN) 25 MG tablet Take 1 tablet (25 mg total) by mouth every 6 (six) hours as needed for nausea or vomiting. (Patient not taking: Reported on 09/17/2022) 30 tablet 2   scopolamine (TRANSDERM-SCOP) 1 MG/3DAYS Place 1 patch (1.5 mg total) onto the skin every 3 (three) days. (Patient not taking: Reported on 09/03/2022) 10 patch 2   sertraline (ZOLOFT) 50 MG tablet Take 3 tablets (150 mg total) by mouth daily. (Patient not taking: Reported on 09/03/2022) 90 tablet 3   terconazole (TERAZOL 7) 0.4 % vaginal cream Place 1 applicator vaginally at bedtime. Use for seven days 45 g 0   No current facility-administered medications on file prior to visit.    Allergies  Allergen Reactions   Bee Venom     Social History:  reports that she quit smoking about 5 years ago. Her smoking use included cigarettes. She has never used smokeless tobacco. She reports that she does not currently use alcohol. She reports that she does not currently use drugs after having used the following drugs: Marijuana.  Family History  Adopted: Yes  Problem Relation Age of Onset   Breast cancer Mother    Alcohol abuse Mother    Drug abuse Mother    Cancer Father    Alcohol abuse Father    Drug abuse Father    Sickle cell anemia Sister    Cancer - Cervical Sister    Drug abuse Brother     The following portions of the patient's history were reviewed and updated as appropriate: allergies, current medications, past family history, past medical history, past social history, past surgical history and problem list.  Physical Exam:  BP 112/69   Pulse 81   Wt 152 lb 9.6 oz (69.2 kg)   LMP 07/08/2022   BMI 25.39 kg/m  CONSTITUTIONAL: Well-developed, well-nourished female in no acute distress.  HENT:  Normocephalic, atraumatic. Oropharynx is clear and moist EYES: Conjunctivae normal. No scleral icterus.  SKIN: Skin is warm and dry.  No rash noted. Not diaphoretic. No erythema. No pallor. MUSCULOSKELETAL: Normal range of motion. No tenderness.  No cyanosis, clubbing, or edema.   NEUROLOGIC: Alert and oriented to person, place, and time. Normal muscle tone coordination.  PSYCHIATRIC: Normal mood and affect. Normal behavior. Normal judgment and thought content. CARDIOVASCULAR: Normal heart rate noted. RESPIRATORY: Effort and rate normal. BREASTS: Declined ABDOMEN: Soft, no distention, tenderness, rebound or guarding. Fundal ht: 10 PELVIC: Normal appearing external genitalia; normal appearing vaginal mucosa and cervix.  No abnormal discharge noted.  Pap smear obtained.  Uterus S=D, no other palpable masses, no uterine or adnexal tenderness. Fetal Status: Fetal Heart Rate (bpm): 170   Movement: Present     Indications for ASA therapy (per uptodate) One of the following: Previous pregnancy with preeclampsia, especially early onset and with an adverse outcome No Multifetal gestation No Chronic hypertension No Type 1 or 2 diabetes mellitus No Chronic kidney disease No Autoimmune disease (antiphospholipid syndrome, systemic lupus erythematosus) No  Two or more of the following: Nulliparity No Obesity (body mass index >30 kg/m2) No Family history of preeclampsia in mother or sister No Age ?35 years No Sociodemographic characteristics (African American race, low socioeconomic level) Yes Personal risk factors (eg, previous pregnancy with low birth weight or small for gestational age infant, previous adverse pregnancy outcome [eg, stillbirth], interval >10 years between pregnancies) No  Indications for early GDM screening  First-degree relative with diabetes No BMI >30kg/m2 No Age > 35 No Previous birth of an infant weighing ?4000 g No Gestational diabetes mellitus in a previous pregnancy No Glycated hemoglobin ?5.7 percent (39 mmol/mol), impaired glucose tolerance, or impaired fasting glucose on previous testing  No High-risk race/ethnicity (eg, African American, Latino, Native American, [place] American, Pacific Islander) Yes Previous stillbirth of unknown cause No Maternal birthweight > 9 lbs No History of cardiovascular disease No Hypertension or on therapy for hypertension No High-density lipoprotein cholesterol level <35 mg/dL (1.61 mmol/L) and/or a triglyceride level >250 mg/dL (0.96 mmol/L) No Polycystic ovary syndrome No Physical inactivity No Other clinical condition associated with insulin resistance (eg, severe obesity, acanthosis nigricans) No Current use of glucocorticoids No  Early screening tests: FBS, A1C, Random CBG, glucose challenge   Assessment:   Pregnancy: [id number] 1. Supervision of other normal pregnancy, antepartum - Routine care - Panorama Prenatal Test Full Panel - [place] Basic Panel  2. Sickle cell trait (HCC) - partner test  3. 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 Cervical cancer screening  - Cytology - PAP( [place])    Plan:  Initial labs drawn/reviewed. Prenatal vitamins. Rx ASA for reduction of risk for preeclampsia.  Problem list reviewed and updated. Genetic screening discussed: NIPS/First trimester screen/Quad/AFP ordered. Role of ultrasound in pregnancy discussed; Anatomy US: ordered. Amniocentesis discussed: not indicated. Follow up in  weeks. (Traditional/Centering/Mom-Baby Combined Care) Discussed clinic routines, schedule of care and testing, genetic screening options, involvement of students and residents under the direct supervision of APPs and doctors and presence of female providers. Pt verbalized understanding.  [place], [place] 09/17/2022 11:33 AM
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
## 2022-09-20 ENCOUNTER — Inpatient Hospital Stay (EMERGENCY_DEPARTMENT_HOSPITAL)
Admission: AD | Admit: 2022-09-20 | Discharge: 2022-09-21 | Disposition: A | Discharge status: Home / Self Care | Payer: Medicaid Other | Source: Home / Self Care | Attending: Obstetrics & Gynecology | Admitting: Obstetrics & Gynecology

## 2022-09-20 ENCOUNTER — Other Ambulatory Visit: Payer: Self-pay | Admitting: Obstetrics and Gynecology

## 2022-09-20 ENCOUNTER — Inpatient Hospital Stay (HOSPITAL_COMMUNITY)
Admission: AD | Admit: 2022-09-20 | Discharge: 2022-09-20 | Disposition: A | Discharge status: Home / Self Care | Payer: Medicaid Other | Attending: Obstetrics and Gynecology | Admitting: Obstetrics and Gynecology

## 2022-09-20 ENCOUNTER — Encounter (HOSPITAL_COMMUNITY): Payer: Self-pay | Admitting: Obstetrics & Gynecology

## 2022-09-20 ENCOUNTER — Encounter (HOSPITAL_COMMUNITY): Payer: Self-pay | Admitting: Obstetrics and Gynecology

## 2022-09-20 DIAGNOSIS — O26891 Other specified pregnancy related conditions, first trimester: Secondary | ICD-10-CM | POA: Insufficient documentation

## 2022-09-20 DIAGNOSIS — R109 Unspecified abdominal pain: Secondary | ICD-10-CM | POA: Diagnosis not present

## 2022-09-20 DIAGNOSIS — R197 Diarrhea, unspecified: Secondary | ICD-10-CM

## 2022-09-20 DIAGNOSIS — R110 Nausea: Secondary | ICD-10-CM

## 2022-09-20 DIAGNOSIS — K625 Hemorrhage of anus and rectum: Secondary | ICD-10-CM

## 2022-09-20 DIAGNOSIS — O99891 Other specified diseases and conditions complicating pregnancy: Secondary | ICD-10-CM | POA: Insufficient documentation

## 2022-09-20 DIAGNOSIS — O210 Mild hyperemesis gravidarum: Secondary | ICD-10-CM

## 2022-09-20 DIAGNOSIS — K581 Irritable bowel syndrome with constipation: Secondary | ICD-10-CM | POA: Insufficient documentation

## 2022-09-20 DIAGNOSIS — O99611 Diseases of the digestive system complicating pregnancy, first trimester: Secondary | ICD-10-CM | POA: Diagnosis not present

## 2022-09-20 DIAGNOSIS — Z87891 Personal history of nicotine dependence: Secondary | ICD-10-CM | POA: Insufficient documentation

## 2022-09-20 DIAGNOSIS — Z79899 Other long term (current) drug therapy: Secondary | ICD-10-CM | POA: Insufficient documentation

## 2022-09-20 DIAGNOSIS — K921 Melena: Secondary | ICD-10-CM | POA: Insufficient documentation

## 2022-09-20 DIAGNOSIS — K5900 Constipation, unspecified: Secondary | ICD-10-CM | POA: Insufficient documentation

## 2022-09-20 DIAGNOSIS — R1030 Lower abdominal pain, unspecified: Secondary | ICD-10-CM | POA: Insufficient documentation

## 2022-09-20 DIAGNOSIS — Z3A10 10 weeks gestation of pregnancy: Secondary | ICD-10-CM

## 2022-09-20 DIAGNOSIS — O219 Vomiting of pregnancy, unspecified: Secondary | ICD-10-CM | POA: Insufficient documentation

## 2022-09-20 DIAGNOSIS — K649 Unspecified hemorrhoids: Secondary | ICD-10-CM | POA: Diagnosis not present

## 2022-09-20 DIAGNOSIS — Z3480 Encounter for supervision of other normal pregnancy, unspecified trimester: Secondary | ICD-10-CM

## 2022-09-20 LAB — URINALYSIS, ROUTINE W REFLEX MICROSCOPIC
Bilirubin Urine: NEGATIVE
Glucose, UA: NEGATIVE mg/dL
Hgb urine dipstick: NEGATIVE
Ketones, ur: 5 mg/dL — AB
Leukocytes,Ua: NEGATIVE
Nitrite: NEGATIVE
Protein, ur: NEGATIVE mg/dL
Specific Gravity, Urine: 1.011 (ref 1.005–1.030)
pH: 5 (ref 5.0–8.0)

## 2022-09-20 LAB — CYTOLOGY - PAP

## 2022-09-20 MED ORDER — METOCLOPRAMIDE HCL 10 MG PO TABS
10.0000 mg | ORAL_TABLET | Freq: Once | ORAL | Status: AC
  Administered 2022-09-20: 10 mg via ORAL
  Filled 2022-09-20: qty 1

## 2022-09-20 MED ORDER — MORPHINE SULFATE (PF) 4 MG/ML IV SOLN
4.0000 mg | Freq: Once | INTRAVENOUS | Status: AC
  Administered 2022-09-20: 4 mg via INTRAMUSCULAR
  Filled 2022-09-20: qty 1

## 2022-09-20 MED ORDER — ALUM & MAG HYDROXIDE-SIMETH 200-200-20 MG/5ML PO SUSP
30.0000 mL | Freq: Once | ORAL | Status: AC
  Administered 2022-09-20: 30 mL via ORAL
  Filled 2022-09-20: qty 30

## 2022-09-20 MED ORDER — PROCHLORPERAZINE MALEATE 10 MG PO TABS
10.0000 mg | ORAL_TABLET | Freq: Four times a day (QID) | ORAL | 0 refills | Status: DC | PRN
Start: 2022-09-20 — End: 2022-10-15

## 2022-09-20 MED ORDER — HYDROCORTISONE (PERIANAL) 2.5 % EX CREA: TOPICAL_CREAM | Freq: Two times a day (BID) | CUTANEOUS | 0 refills | Status: DC

## 2022-09-20 MED ORDER — HYDROCORTISONE (PERIANAL) 2.5 % EX CREA
TOPICAL_CREAM | Freq: Two times a day (BID) | CUTANEOUS | Status: DC
  Filled 2022-09-20: qty 28.35

## 2022-09-20 MED ORDER — SORBITOL 70 % SOLN
960.0000 mL | TOPICAL_OIL | Freq: Once | ORAL | Status: AC
  Administered 2022-09-20: 960 mL via RECTAL
  Filled 2022-09-20: qty 240

## 2022-09-20 MED ORDER — LOPERAMIDE HCL 2 MG PO CAPS
2.0000 mg | ORAL_CAPSULE | Freq: Once | ORAL | Status: DC
  Filled 2022-09-20: qty 1

## 2022-09-20 MED ORDER — LIDOCAINE VISCOUS HCL 2 % MT SOLN
15.0000 mL | Freq: Once | OROMUCOSAL | Status: AC
  Administered 2022-09-20: 15 mL via ORAL
  Filled 2022-09-20: qty 15

## 2022-09-20 NOTE — MAU Provider Note (Signed)
History     CSN: [id number]  Arrival date and time: 09/20/22 2113   Event Date/Time   First Provider Initiated Contact with Patient 09/20/22 2205      Chief Complaint  Patient presents with  . Abdominal Pain  . Rectal Bleeding   Audrey Pearson , a  32 y.o. [id number] at [redacted]w[redacted]d presents to MAU with complaints of rectal bleeding and abdominal pain. Patient was recently seen in MAU for similar complaints. She states she was given an enema for constipation and has has a bowel movement since but reports that her rectum is very sore after going. She also states that her abdominal pain was a 8/10 prior to leaving earlier, but states that its a 10/10 now. She states "she cannot describe it." But is located in her lower abdomen across the bottom. She states the pain is so bad that she is nauseated. She states she is just dry heaving and occasionally throwing up stomach acid. She states that the pain is constant and "kinda sharp." She denies attempting to relieve symptoms at home. She reports blood in her stool prior to leaving earlier and has a known diagnosis of IBS-C. She states she was taking supplements for it and was well controlled. She denies abnormal vaginal discharge, vaginal bleeding and urinary symptoms.         OB History     Gravida  5   Para  2   Term  2   Preterm  0   AB  2   Living  2      SAB  2   IAB  0   Ectopic  0   Multiple  0   Live Births  2           Past Medical History:  Diagnosis Date  . Anxiety   . Asthma   . Depression   . IBS (irritable bowel syndrome)    w/ chronic constipation    Past Surgical History:  Procedure Laterality Date  . WISDOM TOOTH EXTRACTION  01/2019    Family History  Adopted: Yes  Problem Relation Age of Onset  . Breast cancer Mother   . Alcohol abuse Mother   . Drug abuse Mother   . Cancer Father   . Alcohol abuse Father   . Drug abuse Father   . Sickle cell anemia Sister   . Cancer - Cervical Sister   .  Drug abuse Brother     Social History   Tobacco Use  . Smoking status: Former    Types: Cigarettes    Quit date: 2019    Years since quitting: 5.3  . Smokeless tobacco: Never  Vaping Use  . Vaping Use: Former  . Substances: Flavoring  . Devices: Last used last week  Substance Use Topics  . Alcohol use: Not Currently    Comment: not since confirmed pregnancy  . Drug use: Not Currently    Types: Marijuana    Allergies:  Allergies  Allergen Reactions  . Bee Venom     Medications Prior to Admission  Medication Sig Dispense Refill Last Dose  . albuterol (PROVENTIL HFA;VENTOLIN HFA) 108 (90 Base) MCG/ACT inhaler Inhale 2 puffs into the lungs every 4 (four) hours as needed for wheezing or shortness of breath.     . Albuterol-Budesonide (AIRSUPRA) 90-80 MCG/ACT AERO Inhale into the lungs. (Patient not taking: Reported on 09/03/2022)     . Blood Pressure Monitoring (BLOOD PRESSURE KIT) DEVI 1 Device by Does  not apply route once a week. 1 each 0   . dicyclomine (BENTYL) 20 MG tablet Take 1 tablet by mouth 2 (two) times daily. (Patient not taking: Reported on 09/03/2022)     . famotidine (PEPCID) 20 MG tablet Take 1 tablet (20 mg total) by mouth 2 (two) times daily. (Patient not taking: Reported on 09/17/2022) 60 tablet 0   . promethazine (PHENERGAN) 25 MG tablet Take 1 tablet (25 mg total) by mouth every 6 (six) hours as needed for nausea or vomiting. (Patient not taking: Reported on 09/17/2022) 30 tablet 2   . scopolamine (TRANSDERM-SCOP) 1 MG/3DAYS Place 1 patch (1.5 mg total) onto the skin every 3 (three) days. (Patient not taking: Reported on 09/03/2022) 10 patch 2   . sertraline (ZOLOFT) 50 MG tablet Take 3 tablets (150 mg total) by mouth daily. (Patient not taking: Reported on 09/03/2022) 90 tablet 3     Review of Systems  Constitutional:  Negative for chills, fatigue and fever.  Eyes:  Negative for pain and visual disturbance.  Respiratory:  Negative for apnea, shortness of breath and  wheezing.   Cardiovascular:  Negative for chest pain and palpitations.  Gastrointestinal:  Positive for abdominal pain, blood in stool, constipation and rectal pain. Negative for diarrhea, nausea and vomiting.  Genitourinary:  Negative for difficulty urinating, dysuria, pelvic pain, vaginal bleeding, vaginal discharge and vaginal pain.  Musculoskeletal:  Negative for back pain.  Neurological:  Negative for seizures, weakness and headaches.  Psychiatric/Behavioral:  Negative for suicidal ideas.    Physical Exam   Blood pressure 127/72, pulse (!) 105, temperature (!) 97.4 F (36.3 C), height 5\' 6"  (1.676 m), weight 67.6 kg, last menstrual period 07/08/2022, SpO2 98 %, unknown if currently breastfeeding.  Physical Exam Vitals and nursing note reviewed.  Constitutional:      General: She is in acute distress.     Appearance: Normal appearance.     Comments: Patient rocking back and forth in the bed.   HENT:     Head: Normocephalic.  Pulmonary:     Effort: Pulmonary effort is normal.  Abdominal:     Palpations: Abdomen is soft.     Tenderness: There is abdominal tenderness in the right lower quadrant and left lower quadrant. There is guarding. There is no right CVA tenderness, left CVA tenderness or rebound. Negative signs include Murphy's sign.     Comments: Pregnant   Musculoskeletal:     Cervical back: Normal range of motion.  Skin:    General: Skin is warm and dry.  Neurological:     Mental Status: She is alert and oriented to person, place, and time.  Psychiatric:        Mood and Affect: Mood normal.   FHT obtained in triage  MAU Course  Procedures Orders Placed This Encounter  Procedures  . CBC with Differential/Platelet  . Discharge patient   Meds ordered this encounter  Medications  . hydrocortisone (ANUSOL-HC) 2.5 % rectal cream  . metoCLOPramide (REGLAN) tablet 10 mg  . morphine (PF) 4 MG/ML injection 4 mg  . AND Linked Order Group   . alum & mag  hydroxide-simeth (MAALOX/MYLANTA) 200-200-20 MG/5ML suspension 30 mL   . lidocaine (XYLOCAINE) 2 % viscous mouth solution 15 mL  . loperamide (IMODIUM) capsule 2 mg  . hydrocortisone (ANUSOL-HC) 2.5 % rectal cream    Sig: Place rectally 2 (two) times daily.    Dispense:  30 g    Refill:  0    Order  Specific Question:   Supervising Provider    Answer:   Reva Bores [[id number]]  . ondansetron (ZOFRAN-ODT) disintegrating tablet 8 mg  . dicyclomine (BENTYL) tablet 20 mg    MDM - Patient given IM morphine for abdominal pain and medication for nausea given.  -  Pain improved with IM morphine - Upon reassessment patient reports 3-4 more incidences of diarrhea with rectal  bleeding in MAU and patient reports pain coming back. Now described as cramping. Abdomen remains soft.  - GI cocktail ordered and PO imodium offered. Patient declined imodium  - CBC ordered to assess white count  - White count mildly elevated, 17.2  - Consult to Dr. Debroah Loop on antispamodic safe in pregnancy anf MD recommends Bentyl  - @ 2:16 AM- patient alseep on the toilet. Still reprots pain 7/10 and continued rectal bleeding.   - Patient vomited Bentyl shortly after ingesting. IM Bentyl ordered and phenergan for nausea and vomiting.  - Call placed to [facility] in regards to Abdominal CT for lower GI bleeding. Per Dr. Ladona Ridgel not recommended given gestation and acute problem.  - IM bentyl given.  - @ 4:03 AM - reassessment patient overall well appearing. Reports heat packs between cheeks is giving her significant relief.  - Plan for discharge   Assessment and Plan   1. Irritable bowel syndrome with constipation   2. Supervision of other normal pregnancy, antepartum   3. Diarrhea, unspecified type   4. Lower abdominal pain   5. [redacted] weeks gestation of pregnancy   6. Nausea   7. Rectal bleeding    - Reviewed symptoms associated with IBS and more specifically IBS-C.  - Recomneded that patient take 20mg  Bentyl QID and  referral placed to Gastroenterology. - Rx for reglan sent to [facility] for pick up.  - Discussed safe in pregnancy comfort measures like heat packs and warm sitz bath.  - Worsening signs and return precautions reviewed,  - [facility] discharged home in stable condition   Claudette Head, MSN CNM  09/21/2022, 2:15 AM

## 2022-09-20 NOTE — MAU Note (Addendum)
...  Audrey Pearson is a 32 y.o. at [redacted]w[redacted]d here in MAU reporting: Bilateral lower abdominal pain accompanied by constipation and N/V. She reports she has not had a bowel movement since this past Saturday, 5/5. She reports she has taken Colace, Miralax, and Glycerin suppositories with no relief. She reports she has IBS with Chronic Constipation. When asked what usually helps her have a BM she reports she usually takes "digestive supplements." She reports she stopped taking them once she found out she was pregnant and has not asked her provider if they are safe or not. She reports she has been experiencing nausea and vomiting her entire pregnancy. She reports she does not have any episodes of nausea or emesis on the weekends as she is cooking her own food. Denies vaginal bleeding, vaginal discharge, and urinary sx's.  Has Zofran, Scopolamine Patches, and Pepcid at home. Currently wearing a Scop Patch. Applied this morning. Last took Zofran yesterday. Not taking Pepcid. She reports she has been taking two Zofran every eight hours since this past Monday.  3 episodes of emesis since 0700.  Onset of complaint: 09/16/2022 Pain score: 9/10 left lower abdomen, 8/10 right lower abdomen  FHT: 166 doppler Lab orders placed from triage:  UA

## 2022-09-20 NOTE — MAU Note (Signed)
.  Audrey Pearson is a 32 y.o. at [redacted]w[redacted]d here in MAU reporting she was here earlier for constipation and was given and enema. States she is bleeding from her rectum and having bad abdominal pain. Pt states her pain was an 8 when she left at 1730 and then became 10 at 1800. Dry heaves in Triage and unable to sit down due to pain  Onset of complaint: today Pain score: 10 There were no vitals filed for this visit.   FHT:n/a Lab orders placed from triage:  none

## 2022-09-20 NOTE — MAU Provider Note (Signed)
History     CSN: [id number]  Arrival date and time: 09/20/22 1317   Event Date/Time   First Provider Initiated Contact with Patient 09/20/22 1512      Chief Complaint  Patient presents with   Constipation   Abdominal Pain   HPI Audrey Pearson is a 32 y.o. year old [id number] female at [redacted]w[redacted]d weeks gestation who presents to MAU reporting lower abdominal pain bilaterally, but L>R. She reports increased constipation and N/V. She has thrown up 3 times since 0700. She has not had a BM since 09/16/2022. She has a h/o IBS-C; doesn't take medication for it. She reports using "digestive supplements;" magnesium, digest gold and intestinal support and vitamin B12. She has not taken any of those medicines since she found out she was pregnant, because she has not had a chance to ask her OB provider if they are safe in pregnancy. She has taken Zofran yesterday, placed a Scope patch this AM and she has not take Pepcid. She had to stop taking Phenergan, because it was causing to fall asleep at work. She started taking Zofran every 8 hrs as prescribed on Monday 09/17/22. She receives [facility] with [facility]; next appt is 10/15/2022.   OB History     Gravida  5   Para  2   Term  2   Preterm  0   AB  2   Living  2      SAB  2   IAB  0   Ectopic  0   Multiple  0   Live Births  2           Past Medical History:  Diagnosis Date   Anxiety    Asthma    Depression    IBS (irritable bowel syndrome)    w/ chronic constipation    Past Surgical History:  Procedure Laterality Date   WISDOM TOOTH EXTRACTION  01/2019    Family History  Adopted: Yes  Problem Relation Age of Onset   Breast cancer Mother    Alcohol abuse Mother    Drug abuse Mother    Cancer Father    Alcohol abuse Father    Drug abuse Father    Sickle cell anemia Sister    Cancer - Cervical Sister    Drug abuse Brother     Social History   Tobacco Use   Smoking status: Former    Types: Cigarettes    Quit date: 2019     Years since quitting: 5.3   Smokeless tobacco: Never  Vaping Use   Vaping Use: Former   Substances: Flavoring   Devices: Last used last week  Substance Use Topics   Alcohol use: Not Currently    Comment: not since confirmed pregnancy   Drug use: Not Currently    Types: Marijuana    Allergies:  Allergies  Allergen Reactions   Bee Venom     Medications Prior to Admission  Medication Sig Dispense Refill Last Dose   albuterol (PROVENTIL HFA;VENTOLIN HFA) 108 (90 Base) MCG/ACT inhaler Inhale 2 puffs into the lungs every 4 (four) hours as needed for wheezing or shortness of breath.      Albuterol-Budesonide (AIRSUPRA) 90-80 MCG/ACT AERO Inhale into the lungs. (Patient not taking: Reported on 09/03/2022)      Blood Pressure Monitoring (BLOOD PRESSURE KIT) DEVI 1 Device by Does not apply route once a week. 1 each 0    dicyclomine (BENTYL) 20 MG tablet Take 1 tablet by mouth  2 (two) times daily. (Patient not taking: Reported on 09/03/2022)      famotidine (PEPCID) 20 MG tablet Take 1 tablet (20 mg total) by mouth 2 (two) times daily. (Patient not taking: Reported on 09/17/2022) 60 tablet 0    ondansetron (ZOFRAN) 4 MG tablet Take 1-2 tablets (4-8 mg total) by mouth every 8 (eight) hours as needed for nausea or vomiting. 60 tablet 3    promethazine (PHENERGAN) 25 MG tablet Take 1 tablet (25 mg total) by mouth every 6 (six) hours as needed for nausea or vomiting. (Patient not taking: Reported on 09/17/2022) 30 tablet 2    scopolamine (TRANSDERM-SCOP) 1 MG/3DAYS Place 1 patch (1.5 mg total) onto the skin every 3 (three) days. (Patient not taking: Reported on 09/03/2022) 10 patch 2    sertraline (ZOLOFT) 50 MG tablet Take 3 tablets (150 mg total) by mouth daily. (Patient not taking: Reported on 09/03/2022) 90 tablet 3    terconazole (TERAZOL 7) 0.4 % vaginal cream Place 1 applicator vaginally at bedtime. Use for seven days 45 g 0     Review of Systems  Constitutional: Negative.   HENT: Negative.     Eyes: Negative.   Respiratory: Negative.    Cardiovascular: Negative.   Gastrointestinal:  Positive for abdominal pain (LLQ) and constipation (no BM since 5/5).  Endocrine: Negative.   Genitourinary: Negative.   Musculoskeletal: Negative.   Skin: Negative.   Allergic/Immunologic: Negative.   Neurological: Negative.   Hematological: Negative.   Psychiatric/Behavioral: Negative.     Physical Exam   Blood pressure (!) 112/56, pulse 74, temperature 99.1 F (37.3 C), temperature source Oral, resp. rate 15, height 5\' 6"  (1.676 m), weight 69.9 kg, last menstrual period 07/08/2022, SpO2 97 %, unknown if currently breastfeeding.  Physical Exam Vitals and nursing note reviewed.  Constitutional:      Appearance: Normal appearance. She is normal weight.  Cardiovascular:     Rate and Rhythm: Normal rate.  Pulmonary:     Effort: Pulmonary effort is normal.  Abdominal:     Palpations: Abdomen is soft.  Musculoskeletal:        General: Normal range of motion.  Skin:    General: Skin is warm and dry.  Neurological:     Mental Status: She is alert and oriented to person, place, and time.  Psychiatric:        Mood and Affect: Mood normal.        Behavior: Behavior normal.        Thought Content: Thought content normal.        Judgment: Judgment normal.    FHTs by doppler: 166 bpm  MAU Course  Procedures  MDM CCUA SMOG Enema -- (+) results  Results for orders placed or performed during the hospital encounter of 09/20/22 (from the past 24 hour(s))  Urinalysis, Routine w reflex microscopic -Urine, Clean Catch     Status: Abnormal   Collection Time: 09/20/22  1:34 PM  Result Value Ref Range   Color, Urine YELLOW YELLOW   APPearance CLEAR CLEAR   Specific Gravity, Urine 1.011 1.005 - 1.030   pH 5.0 5.0 - 8.0   Glucose, UA NEGATIVE NEGATIVE mg/dL   Hgb urine dipstick NEGATIVE NEGATIVE   Bilirubin Urine NEGATIVE NEGATIVE   Ketones, ur 5 (A) NEGATIVE mg/dL   Protein, ur NEGATIVE  NEGATIVE mg/dL   Nitrite NEGATIVE NEGATIVE   Leukocytes,Ua NEGATIVE NEGATIVE    Assessment and Plan  1. Constipation during pregnancy in first trimester  2. Abdominal pain during pregnancy in first trimester - Information provided on abdominal pain in pregnancy   3. [redacted] weeks gestation of pregnancy   - Discharge patient - Keep scheduled appt with [facility] on 10/15/2022 - Patient verbalized an understanding of the plan of care and agrees.   Raelyn Mora, CNM 09/20/2022, 5:27 PM

## 2022-09-21 LAB — CBC WITH DIFFERENTIAL/PLATELET
Abs Immature Granulocytes: 0.08 10*3/uL — ABNORMAL HIGH (ref 0.00–0.07)
Basophils Absolute: 0 10*3/uL (ref 0.0–0.1)
Basophils Relative: 0 %
Eosinophils Absolute: 0 10*3/uL (ref 0.0–0.5)
Eosinophils Relative: 0 %
HCT: 34 % — ABNORMAL LOW (ref 36.0–46.0)
Hemoglobin: 12.1 g/dL (ref 12.0–15.0)
Immature Granulocytes: 1 %
Lymphocytes Relative: 4 %
Lymphs Abs: 0.6 10*3/uL — ABNORMAL LOW (ref 0.7–4.0)
MCH: 29.4 pg (ref 26.0–34.0)
MCHC: 35.6 g/dL (ref 30.0–36.0)
MCV: 82.5 fL (ref 80.0–100.0)
Monocytes Absolute: 0.3 10*3/uL (ref 0.1–1.0)
Monocytes Relative: 2 %
Neutro Abs: 16.3 10*3/uL — ABNORMAL HIGH (ref 1.7–7.7)
Neutrophils Relative %: 93 %
Platelets: 225 10*3/uL (ref 150–400)
RBC: 4.12 MIL/uL (ref 3.87–5.11)
RDW: 13.7 % (ref 11.5–15.5)
WBC: 17.3 10*3/uL — ABNORMAL HIGH (ref 4.0–10.5)
nRBC: 0 % (ref 0.0–0.2)

## 2022-09-21 MED ORDER — PROMETHAZINE HCL 25 MG PO TABS
12.5000 mg | ORAL_TABLET | Freq: Once | ORAL | Status: AC
  Administered 2022-09-21: 12.5 mg via ORAL
  Filled 2022-09-21: qty 1

## 2022-09-21 MED ORDER — DICYCLOMINE HCL 10 MG/ML IM SOLN
20.0000 mg | Freq: Once | INTRAMUSCULAR | Status: AC
  Administered 2022-09-21: 20 mg via INTRAMUSCULAR
  Filled 2022-09-21: qty 2

## 2022-09-21 MED ORDER — DICYCLOMINE HCL 10 MG PO CAPS: 20.0000 mg | ORAL_CAPSULE | Freq: Every day | ORAL | 3 refills | Status: DC

## 2022-09-21 MED ORDER — ONDANSETRON 4 MG PO TBDP
8.0000 mg | ORAL_TABLET | Freq: Once | ORAL | Status: DC
  Filled 2022-09-21: qty 2

## 2022-09-21 MED ORDER — DICYCLOMINE HCL 10 MG PO CAPS
20.0000 mg | ORAL_CAPSULE | Freq: Every day | ORAL | Status: DC
  Administered 2022-09-21: 20 mg via ORAL
  Filled 2022-09-21: qty 2

## 2022-09-23 LAB — PANORAMA PRENATAL TEST FULL PANEL:PANORAMA TEST PLUS 5 ADDITIONAL MICRODELETIONS: FETAL FRACTION: 7.9

## 2022-09-24 NOTE — Patient Instructions (Signed)

## 2022-09-25 ENCOUNTER — Encounter: Payer: Self-pay | Admitting: Gastroenterology

## 2022-09-25 ENCOUNTER — Telehealth: Payer: Self-pay | Admitting: *Deleted

## 2022-09-25 ENCOUNTER — Encounter: Payer: Self-pay | Admitting: Advanced Practice Midwife

## 2022-09-25 DIAGNOSIS — R87612 Low grade squamous intraepithelial lesion on cytologic smear of cervix (LGSIL): Secondary | ICD-10-CM | POA: Insufficient documentation

## 2022-09-25 LAB — HORIZON CUSTOM: REPORT SUMMARY: POSITIVE — AB

## 2022-09-25 NOTE — Progress Notes (Signed)
TC. Advised of results and option for colpo at upcoming visit or delayed to postpartum visit. Pt elected to proceed with colpo at next visit. Change to visit coordinated with schedulers. Pt to come in earlier at 8:55 on 10/15/22. Pt advised of new time. All questions answered. Pt verbalized understanding. [facility] message with education sent.

## 2022-09-25 NOTE — Telephone Encounter (Signed)
TC to advise pt of Pap results. [facility] results received during call. 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 Pt informed and all questions answered. FOB already collected partner test and [employer] is scheduled to pick up today. Pt advised genetic referral may be made once results are reviewed by provider.

## 2022-10-15 ENCOUNTER — Ambulatory Visit (INDEPENDENT_AMBULATORY_CARE_PROVIDER_SITE_OTHER): Payer: Medicaid Other | Admitting: Obstetrics and Gynecology

## 2022-10-15 ENCOUNTER — Encounter: Payer: Medicaid Other | Admitting: Obstetrics and Gynecology

## 2022-10-15 VITALS — BP 105/66 | HR 97 | Wt 151.0 lb

## 2022-10-15 DIAGNOSIS — Z3480 Encounter for supervision of other normal pregnancy, unspecified trimester: Secondary | ICD-10-CM

## 2022-10-15 DIAGNOSIS — R87612 Low grade squamous intraepithelial lesion on cytologic smear of cervix (LGSIL): Secondary | ICD-10-CM

## 2022-10-15 DIAGNOSIS — Z3A14 14 weeks gestation of pregnancy: Secondary | ICD-10-CM

## 2022-10-15 DIAGNOSIS — D573 Sickle-cell trait: Secondary | ICD-10-CM

## 2022-10-15 NOTE — Patient Instructions (Signed)
Senokot for constipation, buy OTC

## 2022-10-15 NOTE — Progress Notes (Signed)
ROB/COLPO.
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
## 2022-11-05 ENCOUNTER — Ambulatory Visit (INDEPENDENT_AMBULATORY_CARE_PROVIDER_SITE_OTHER): Payer: Medicaid Other | Admitting: Gastroenterology

## 2022-11-05 ENCOUNTER — Encounter: Payer: Self-pay | Admitting: Gastroenterology

## 2022-11-05 VITALS — BP 96/60 | HR 84 | Ht 66.0 in | Wt 151.5 lb

## 2022-11-05 DIAGNOSIS — K5909 Other constipation: Secondary | ICD-10-CM

## 2022-11-05 DIAGNOSIS — K649 Unspecified hemorrhoids: Secondary | ICD-10-CM

## 2022-11-05 DIAGNOSIS — K625 Hemorrhage of anus and rectum: Secondary | ICD-10-CM

## 2022-11-05 MED ORDER — CALMOL-4 76-10 % RE SUPP: RECTAL | 0 refills | Status: DC

## 2022-11-05 MED ORDER — POLYETHYLENE GLYCOL 3350 17 G PO PACK: 34.0000 g | PACK | Freq: Two times a day (BID) | ORAL | 0 refills | Status: DC

## 2022-11-05 NOTE — Patient Instructions (Addendum)
If your blood pressure at your visit was 140/90 or greater, please contact your primary care physician to follow up on this. ______________________________________________________  If you are age 34 or older, your body mass index should be between 23-30. Your Body mass index is 24.45 kg/m. If this is out of the aforementioned range listed, please consider follow up with your Primary Care Provider.  If you are age 76 or younger, your body mass index should be between 19-25. Your Body mass index is 24.45 kg/m. If this is out of the aformentioned range listed, please consider follow up with your Primary Care Provider.  ________________________________________________________  The  GI providers would like to encourage you to use [facility] to communicate with providers for non-urgent requests or questions.  Due to long hold times on the telephone, sending your provider a message by [facility] may be a faster and more efficient way to get a response.  Please allow 48 business hours for a response.  Please remember that this is for non-urgent requests.  _______________________________________________________  Due to recent changes in healthcare laws, you may see the results of your imaging and laboratory studies on MyChart before your provider has had a chance to review them.  We understand that in some cases there may be results that are confusing or concerning to you. Not all laboratory results come back in the same time frame and the provider may be waiting for multiple results in order to interpret others.  Please give [place] 48 hours in order for your provider to thoroughly review all the results before contacting the office for clarification of your results.   We have given you samples of the following medication to take: Calmol 4 suppositories: Use as needed  Please purchase the following medications over the counter and take as directed: Miralax: Use a double dose twice a day, titrate as  needed  Thank you for entrusting me with your care and for choosing [facility], Dr. Ileene Patrick

## 2022-11-05 NOTE — Progress Notes (Signed)
HPI :  32 year old female with a history of asthma, sickle cell trait, LGSIL/HPV, chronic constipation, currently pregnant, referred here by Sandra Cockayne CNM for constipation and rectal bleeding.  Patient is currently [redacted] weeks pregnant.  She states she has had chronic constipation her entire life, can have 1 bowel movement every 3 to 4 days on average.  Often passes hard stools with straining that are difficult to pass.  Historically she has tried a variety of over-the-counter regimens and enemas to treat her symptoms.  Milk of Magnesia, fiber supplementation, MiraLAX, other enemas, plant-based stimulant laxatives.  She had a very difficult time passing stool, did not have a bowel movement for a week, went to the ED.  Was given an enema.  She states shortly after the enema she started having some bleeding symptoms.  From May 6 to May 13, she states she was passing a fair amount of bright blood with her stool.  Since that time her symptoms have improved, that type of bleeding has not recurred.  She is passing brown stool, can rarely have some red blood on the toilet paper when wiping, but nothing other than that.  She had some rectal discomfort with the bleeding but not significant or shooting pains.  Since then has been having a bowel movement every 3 to 4 days again.  She is never had a prior colonoscopy.  No family history of colon cancer or colitis.  She was given some hydrocortisone/Anusol cream to use.  She states use this at the onset of her symptoms and it did help at the time of bleeding, she has since used only as needed.  She states over time she has had constipation and passing hard stools, she never really had any significant bleeding until this past month.  She has never been on prescription laxative.  Blood work in the ED at the time of symptoms showed a hemoglobin of 12.1.  Otherwise had some nausea in the first trimester but more recently her symptoms have been pretty well-controlled  as the pregnancy has progressed.  Otherwise denies complaints today.   RUQ [place] 08/18/22: IMPRESSION: Unremarkable right upper quadrant ultrasound.   Past Medical History:  Diagnosis Date   Anxiety    Asthma    Depression    IBS (irritable bowel syndrome)    w/ chronic constipation     Past Surgical History:  Procedure Laterality Date   WISDOM TOOTH EXTRACTION  01/2019   Family History  Adopted: Yes  Problem Relation Age of Onset   Breast cancer Mother    Alcohol abuse Mother    Drug abuse Mother    COPD Mother    Cancer Father    Alcohol abuse Father    Drug abuse Father    Pancreatic cancer Father    Sickle cell anemia Sister    Cancer - Cervical Sister    Drug abuse Brother    Lung cancer Paternal Grandmother    Colon cancer Neg Hx    Rectal cancer Neg Hx    Esophageal cancer Neg Hx    Liver cancer Neg Hx    Social History   Tobacco Use   Smoking status: Former    Types: Cigarettes    Quit date: 2019    Years since quitting: 5.4   Smokeless tobacco: Never  Vaping Use   Vaping Use: Former   Substances: Flavoring   Devices: Last used last week  Substance Use Topics   Alcohol use: Not Currently  Comment: not since confirmed pregnancy   Drug use: Not Currently    Types: Marijuana   Current Outpatient Medications  Medication Sig Dispense Refill   albuterol (PROVENTIL HFA;VENTOLIN HFA) 108 (90 Base) MCG/ACT inhaler Inhale 2 puffs into the lungs every 4 (four) hours as needed for wheezing or shortness of breath.     Albuterol-Budesonide (AIRSUPRA) 90-80 MCG/ACT AERO Inhale into the lungs.     Blood Pressure Monitoring (BLOOD PRESSURE KIT) DEVI 1 Device by Does not apply route once a week. 1 each 0   hydrocortisone (ANUSOL-HC) 2.5 % rectal cream Place rectally 2 (two) times daily. 30 g 0   No current facility-administered medications for this visit.   Allergies  Allergen Reactions   Bee Venom      Review of Systems: All systems reviewed and  negative except where noted in HPI.    No results found.  Physical Exam: BP 96/60   Pulse 84   Ht 5\' 6"  (1.676 m)   Wt 151 lb 8 oz (68.7 kg)   LMP 07/08/2022   SpO2 99%   BMI 24.45 kg/m  Constitutional: Pleasant,well-developed, female in no acute distress. HEENT: Normocephalic and atraumatic. Conjunctivae are normal. No scleral icterus. Neck supple.  Cardiovascular: Normal rate, regular rhythm.  Pulmonary/chest: Effort normal and breath sounds normal.  Abdominal: Soft, nondistended, nontender. There are no masses palpable.  DRE / Anoscopy - Ailene Rud CMA as standby - no fissure, internal hemorrhoids, no mass lesion.  Extremities: no edema Lymphadenopathy: No cervical adenopathy noted. Neurological: Alert and oriented to person place and time. Skin: Skin is warm and dry. No rashes noted. Psychiatric: Normal mood and affect. Behavior is normal.   ASSESSMENT: 32 y.o. female here for assessment of the following  1. Rectal bleeding   2. Hemorrhoids, unspecified hemorrhoid type   3. Chronic constipation    History of chronic constipation with straining and infrequent hard stools, presenting with multiple episodes of rectal bleeding in early May following administration of enema for worsening constipation.  Since that time, was treated with Anusol cream and her symptoms of bleeding have resolved.  Has some occasional scant blood on the toilet paper with passing hard stool.  Hemoglobin normal.  DRE and Anoscopy in the office today shows hemorrhoids as the likely cause.  No anal fissure.  No mass lesions or concerning process otherwise.  We discussed the situation and that hemorrhoids are the very likely cause of her symptoms, although unable to say for certain without performing an endoscopic evaluation.  We discussed what that would entail, colonoscopy versus flex sig.  Given her current status, second trimester pregnancy, will treat for hemorrhoids and constipation initially and hold  off on endoscopic evaluation for now given associated risks while pregnant.  If her symptoms persist or worsen moving forward can consider flex sig moving forward.  We discussed options for management of her constipation.  She has tried a variety of regimens.  It does not appear she has tried high-dose MiraLAX yet and I would recommend starting with that given favorable safety profile.  She will try double dose MiraLAX twice daily in addition to her over-the-counter plant-based regimen which he prefers to continue.  Over the combination of these regimens works.  If not we can use other options such as Linzess or Amitiza, however not much safety data with use in pregnancy on that type of regimen.  Will otherwise add Calmol 4 suppositories to use as needed over-the-counter.    Will see how  she does on this regimen.  I asked her to contact me if no better in the upcoming few weeks.  If her symptoms persist despite management of constipation and may consider flex sig as above.  PLAN: - start high dose Miralax - double dose BID and titrate down as tolerated - continue plant based OTC laxative PRN - Calmol 4 suppositories PRN - contact me if no better, can consider flex sig or colonoscopy if symptoms persist but want to avoid that if possible  Harlin Rain, MD [facility]  CC: Carlynn Herald, C*

## 2022-11-08 ENCOUNTER — Telehealth: Payer: Self-pay

## 2022-11-08 NOTE — Telephone Encounter (Signed)
Patient called and left message on triage vm stating that she is having flu like sx and wanted to know what she could take.  Returned patient call. No answer. Left vm for patient to return call.

## 2022-11-12 ENCOUNTER — Ambulatory Visit (INDEPENDENT_AMBULATORY_CARE_PROVIDER_SITE_OTHER): Payer: Medicaid Other | Admitting: Obstetrics and Gynecology

## 2022-11-12 ENCOUNTER — Encounter: Payer: Self-pay | Admitting: Obstetrics and Gynecology

## 2022-11-12 VITALS — BP 102/68 | HR 90 | Wt 149.0 lb

## 2022-11-12 DIAGNOSIS — O09529 Supervision of elderly multigravida, unspecified trimester: Secondary | ICD-10-CM | POA: Insufficient documentation

## 2022-11-12 DIAGNOSIS — Z3480 Encounter for supervision of other normal pregnancy, unspecified trimester: Secondary | ICD-10-CM

## 2022-11-12 DIAGNOSIS — B977 Papillomavirus as the cause of diseases classified elsewhere: Secondary | ICD-10-CM

## 2022-11-12 DIAGNOSIS — D573 Sickle-cell trait: Secondary | ICD-10-CM
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
## 2022-11-14 LAB — AFP, SERUM, OPEN SPINA BIFIDA
AFP MoM: 0.93
AFP Value: 46.1 ng/mL
Gest. Age on Collection Date: 18.1 weeks
Maternal Age At EDD: 34.5 yr
OSBR Risk 1 IN: 10000
Test Results:: NEGATIVE
Weight: 149 [lb_av]

## 2022-11-19 ENCOUNTER — Ambulatory Visit: Payer: Medicaid Other | Attending: Obstetrics and Gynecology

## 2022-11-19 ENCOUNTER — Encounter: Payer: Self-pay | Admitting: *Deleted

## 2022-11-19 ENCOUNTER — Other Ambulatory Visit: Payer: Self-pay | Admitting: Obstetrics and Gynecology

## 2022-11-19 ENCOUNTER — Ambulatory Visit: Payer: Medicaid Other | Admitting: *Deleted

## 2022-11-19 VITALS — BP 112/59 | HR 89

## 2022-11-19 DIAGNOSIS — O09522 Supervision of elderly multigravida, second trimester: Secondary | ICD-10-CM

## 2022-11-19 DIAGNOSIS — Z3480 Encounter for supervision of other normal pregnancy, unspecified trimester: Secondary | ICD-10-CM | POA: Insufficient documentation

## 2022-11-19 DIAGNOSIS — Z363 Encounter for antenatal screening for malformations: Secondary | ICD-10-CM | POA: Diagnosis not present

## 2022-11-19 DIAGNOSIS — Z3A19 19 weeks gestation of pregnancy: Secondary | ICD-10-CM

## 2022-11-19 DIAGNOSIS — D573 Sickle-cell trait: Secondary | ICD-10-CM | POA: Diagnosis not present

## 2022-11-19 DIAGNOSIS — O99012 Anemia complicating pregnancy, second trimester: Secondary | ICD-10-CM | POA: Diagnosis not present

## 2022-11-22 ENCOUNTER — Inpatient Hospital Stay (HOSPITAL_COMMUNITY)
Admission: AD | Admit: 2022-11-22 | Discharge: 2022-11-22 | Discharge status: Against Medical Advice | Payer: Medicaid Other | Attending: Obstetrics & Gynecology | Admitting: Obstetrics & Gynecology

## 2022-11-22 DIAGNOSIS — M549 Dorsalgia, unspecified: Secondary | ICD-10-CM | POA: Diagnosis not present

## 2022-11-22 DIAGNOSIS — O9A212 Injury, poisoning and certain other consequences of external causes complicating pregnancy, second trimester: Secondary | ICD-10-CM | POA: Diagnosis not present

## 2022-11-22 DIAGNOSIS — R1032 Left lower quadrant pain: Secondary | ICD-10-CM | POA: Diagnosis not present

## 2022-11-22 DIAGNOSIS — Z3480 Encounter for supervision of other normal pregnancy, unspecified trimester: Secondary | ICD-10-CM

## 2022-11-22 DIAGNOSIS — M542 Cervicalgia: Secondary | ICD-10-CM | POA: Insufficient documentation

## 2022-11-22 DIAGNOSIS — Z3A19 19 weeks gestation of pregnancy: Secondary | ICD-10-CM | POA: Insufficient documentation

## 2022-11-22 DIAGNOSIS — Z5329 Procedure and treatment not carried out because of patient's decision for other reasons: Secondary | ICD-10-CM | POA: Diagnosis not present
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
## 2022-11-22 NOTE — ED Notes (Signed)
Patient brought over by MAU. Patient states she needs to leave to get her children. Patient signed AMA. Left ambulatory with spouse.
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
## 2022-12-10 ENCOUNTER — Ambulatory Visit (INDEPENDENT_AMBULATORY_CARE_PROVIDER_SITE_OTHER): Payer: Medicaid Other | Admitting: Obstetrics and Gynecology

## 2022-12-10 VITALS — BP 100/67 | HR 83 | Wt 156.0 lb

## 2022-12-10 DIAGNOSIS — Z302 Encounter for sterilization: Secondary | ICD-10-CM

## 2022-12-10 DIAGNOSIS — Z3A22 22 weeks gestation of pregnancy: Secondary | ICD-10-CM

## 2022-12-10 DIAGNOSIS — Z3480 Encounter for supervision of other normal pregnancy, unspecified trimester: Secondary | ICD-10-CM

## 2022-12-10 DIAGNOSIS — D573 Sickle-cell trait: Secondary | ICD-10-CM

## 2022-12-10 DIAGNOSIS — R87612 Low grade squamous intraepithelial lesion on cytologic smear of cervix (LGSIL): Secondary | ICD-10-CM
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
## 2022-12-17 ENCOUNTER — Telehealth: Payer: Self-pay
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
## 2022-12-20 NOTE — Telephone Encounter (Signed)
Left message on vm for patient to return call to the office.

## 2023-01-07 ENCOUNTER — Ambulatory Visit: Payer: Medicaid Other | Admitting: Obstetrics and Gynecology

## 2023-01-07 ENCOUNTER — Encounter: Payer: Medicaid Other | Admitting: Obstetrics and Gynecology

## 2023-01-07 VITALS — BP 111/69 | HR 97 | Wt 164.0 lb

## 2023-01-07 DIAGNOSIS — Z302 Encounter for sterilization: Secondary | ICD-10-CM

## 2023-01-07 DIAGNOSIS — Z3480 Encounter for supervision of other normal pregnancy, unspecified trimester: Secondary | ICD-10-CM

## 2023-01-07 DIAGNOSIS — D573 Sickle-cell trait: Secondary | ICD-10-CM

## 2023-01-07 DIAGNOSIS — R87612 Low grade squamous intraepithelial lesion on cytologic smear of cervix (LGSIL): Secondary | ICD-10-CM
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
## 2023-01-08 ENCOUNTER — Encounter: Payer: Medicaid Other | Admitting: Advanced Practice Midwife

## 2023-01-22 ENCOUNTER — Encounter: Payer: Self-pay | Admitting: Obstetrics and Gynecology

## 2023-01-22 ENCOUNTER — Ambulatory Visit (INDEPENDENT_AMBULATORY_CARE_PROVIDER_SITE_OTHER): Payer: Medicaid Other | Admitting: Obstetrics and Gynecology

## 2023-01-22 ENCOUNTER — Other Ambulatory Visit: Payer: Medicaid Other

## 2023-01-22 VITALS — BP 107/70 | HR 92 | Wt 168.0 lb

## 2023-01-22 DIAGNOSIS — Z3A28 28 weeks gestation of pregnancy: Secondary | ICD-10-CM | POA: Diagnosis not present

## 2023-01-22 DIAGNOSIS — Z302 Encounter for sterilization: Secondary | ICD-10-CM

## 2023-01-22 DIAGNOSIS — Z3483 Encounter for supervision of other normal pregnancy, third trimester: Secondary | ICD-10-CM

## 2023-01-22 DIAGNOSIS — Z3480 Encounter for supervision of other normal pregnancy, unspecified trimester: Secondary | ICD-10-CM

## 2023-01-22 DIAGNOSIS — R87612 Low grade squamous intraepithelial lesion on cytologic smear of cervix (LGSIL): Secondary | ICD-10-CM
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
## 2023-01-22 NOTE — Progress Notes (Signed)
Pt states she is having a lot of pain in lower pelvic area.  Pt states it is making it hard to do activities - recommend support belt.  Pt states she has insomnia - Recommend Tylenol PM, Unisom.

## 2023-01-26 LAB — GLUCOSE TOLERANCE, 2 HOURS W/ 1HR
Glucose, 1 hour: 90 mg/dL (ref 70–179)
Glucose, 2 hour: 96 mg/dL (ref 70–152)
Glucose, Fasting: 74 mg/dL (ref 70–91)

## 2023-01-26 LAB — CBC
Hematocrit: 31.8 % — ABNORMAL LOW (ref 34.0–46.6)
Hemoglobin: 10.2 g/dL — ABNORMAL LOW (ref 11.1–15.9)
MCH: 26.8 pg (ref 26.6–33.0)
MCHC: 32.1 g/dL (ref 31.5–35.7)
MCV: 84 fL (ref 79–97)
Platelets: 227 10*3/uL (ref 150–450)
RBC: 3.81 x10E6/uL (ref 3.77–5.28)
RDW: 14 % (ref 11.7–15.4)
WBC: 9.5 10*3/uL (ref 3.4–10.8)

## 2023-01-26 LAB — HIV ANTIBODY (ROUTINE TESTING W REFLEX): HIV Screen 4th Generation wRfx: NONREACTIVE

## 2023-01-26 LAB — RPR: RPR Ser Ql: NONREACTIVE

## 2023-02-04 ENCOUNTER — Ambulatory Visit (INDEPENDENT_AMBULATORY_CARE_PROVIDER_SITE_OTHER): Payer: Medicaid Other | Admitting: Obstetrics & Gynecology

## 2023-02-04 VITALS — BP 111/72 | HR 102 | Wt 171.0 lb

## 2023-02-04 DIAGNOSIS — Z3A30 30 weeks gestation of pregnancy: Secondary | ICD-10-CM

## 2023-02-04 DIAGNOSIS — Z3480 Encounter for supervision of other normal pregnancy, unspecified trimester: Secondary | ICD-10-CM

## 2023-02-04 DIAGNOSIS — O99013 Anemia complicating pregnancy, third trimester: Secondary | ICD-10-CM

## 2023-02-04 DIAGNOSIS — D573 Sickle-cell trait: Secondary | ICD-10-CM

## 2023-02-04 MED ORDER — FERRIC MALTOL 30 MG PO CAPS
1.0000 | ORAL_CAPSULE | Freq: Two times a day (BID) | ORAL | 2 refills | Status: DC
Start: 2023-02-04 — End: 2023-07-08
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
## 2023-02-04 NOTE — Patient Instructions (Addendum)
Considering Waterbirth? Guide for patients at [facility]) Why consider waterbirth? Gentle birth for babies  Less pain medicine used in labor  May allow for passive descent/less pushing  May reduce perineal tears  More mobility and instinctive maternal position changes  Increased maternal relaxation   Is waterbirth safe? What are the risks of infection, drowning or other complications? Infection:  Very low risk (3.7 % for tub vs 4.8% for bed)  7 in 8000 waterbirths with documented infection  Poorly cleaned equipment most common cause  Slightly lower group B strep transmission rate  Drowning  Maternal:  Very low risk  Related to seizures or fainting  Newborn:  Very low risk. No evidence of increased risk of respiratory problems in multiple large studies  Physiological protection from breathing under water  Avoid underwater birth if there are any fetal complications  Once baby's head is out of the water, keep it out.  Birth complication  Some reports of cord trauma, but risk decreased by bringing baby to surface gradually  No evidence of increased risk of shoulder dystocia. Mothers can usually change positions faster in water than in a bed, possibly aiding the maneuvers to free the shoulder.   There are 2 things you MUST do to have a waterbirth with [facility]: Attend a waterbirth class at [facility]   3rd Wednesday of every month from 7-9 pm (virtual during COVID) [employer] at [email or web address] or [email or web address] or by calling [phone] Bring [place] the certificate from the class to your prenatal appointment or send via MyChart Meet with a midwife at 36 weeks* to see if you can still plan a waterbirth and to sign the consent.   *We also recommend that you schedule as many of your prenatal visits with a midwife as possible.    Helpful information: You may want to bring a bathing suit top to the hospital  to wear during labor but this is optional.  All other supplies are provided by the hospital. Please arrive at the hospital with signs of active labor, and do not wait at home until late in labor. It takes 45 min- 1 hour for fetal monitoring, and check in to your room to take place, plus transport and filling of the waterbirth tub.    Things that would prevent you from having a waterbirth: Premature, <37wks  Previous cesarean birth  Presence of thick meconium-stained fluid  Multiple gestation (Twins, triplets, etc.)  Uncontrolled diabetes or gestational diabetes requiring medication  Hypertension diagnosed in pregnancy or preexisting hypertension (gestational hypertension, preeclampsia, or chronic hypertension) Fetal growth restriction (your baby measures less than 10th percentile on ultrasound) Heavy vaginal bleeding  Non-reassuring fetal heart rate  Active infection (MRSA, etc.). Group B Strep is NOT a contraindication for waterbirth.  If your labor has to be induced and induction method requires continuous monitoring of the baby's heart rate  Other risks/issues identified by your obstetrical provider   Please remember that birth is unpredictable. Under certain unforeseeable circumstances your provider may advise against giving birth in the tub. These decisions will be made on a case-by-case basis and with the safety of you and your baby as our highest priority.    Updated 08/16/21   TDaP Vaccine Pregnancy Get the Whooping Cough Vaccine While You Are Pregnant (CDC)  It is important for women to get the whooping cough vaccine in the third trimester of each pregnancy. Vaccines are the best way to prevent this disease.  There are 2 different whooping cough vaccines. Both vaccines combine protection against whooping cough, tetanus and diphtheria, but they are for different age groups: Tdap: for everyone 11 years or older, including pregnant women  DTaP: for children 2 months through 6 years of  age  You need the whooping cough vaccine during each of your pregnancies The recommended time to get the shot is during your 27th through 36th week of pregnancy, preferably during the earlier part of this time period. The Centers for Disease Control and Prevention (CDC) recommends that pregnant women receive the whooping cough vaccine for adolescents and adults (called Tdap vaccine) during the third trimester of each pregnancy. The recommended time to get the shot is during your 27th through 36th week of pregnancy, preferably during the earlier part of this time period. This replaces the original recommendation that pregnant women get the vaccine only if they had not previously received it. The [employer] of Obstetricians and Gynecologists and the [employer] support this recommendation.  You should get the whooping cough vaccine while pregnant to pass protection to your baby frame support disabled and/or not supported in this browser  Learn why Vernona Rieger decided to get the whooping cough vaccine in her 3rd trimester of pregnancy and how her baby girl was born with some protection against the disease. Also available on YouTube. After receiving the whooping cough vaccine, your body will create protective antibodies (proteins produced by the body to fight off diseases) and pass some of them to your baby before birth. These antibodies provide your baby some short-term protection against whooping cough in early life. These antibodies can also protect your baby from some of the more serious complications that come along with whooping cough. Your protective antibodies are at their highest about 2 weeks after getting the vaccine, but it takes time to pass them to your baby. So the preferred time to get the whooping cough vaccine is early in your third trimester. The amount of whooping cough antibodies in your body decreases over time. That is why CDC recommends you get a whooping cough  vaccine during each pregnancy. Doing so allows each of your babies to get the greatest number of protective antibodies from you. This means each of your babies will get the best protection possible against this disease.  Getting the whooping cough vaccine while pregnant is better than getting the vaccine after you give birth Whooping cough vaccination during pregnancy is ideal so your baby will have short-term protection as soon as he is born. This early protection is important because your baby will not start getting his whooping cough vaccines until he is 2 months old. These first few months of life are when your baby is at greatest risk for catching whooping cough. This is also when he's at greatest risk for having severe, potentially life-threating complications from the infection. To avoid that gap in protection, it is best to get a whooping cough vaccine during pregnancy. You will then pass protection to your baby before he is born. To continue protecting your baby, he should get whooping cough vaccines starting at 2 months old. You may never have gotten the Tdap vaccine before and did not get it during this pregnancy. If so, you should make sure to get the vaccine immediately after you give birth, before leaving the hospital or birthing center. It will take about 2 weeks before your body develops protection (antibodies) in response to the vaccine. Once you have protection from the vaccine,  you are less likely to give whooping cough to your newborn while caring for him. But remember, your baby will still be at risk for catching whooping cough from others. A recent study looked to see how effective Tdap was at preventing whooping cough in babies whose mothers got the vaccine while pregnant or in the hospital after giving birth. The study found that getting Tdap between 27 through 36 weeks of pregnancy is 85% more effective at preventing whooping cough in babies younger than 2 months old. Blood tests  cannot tell if you need a whooping cough vaccine There are no blood tests that can tell you if you have enough antibodies in your body to protect yourself or your baby against whooping cough. Even if you have been sick with whooping cough in the past or previously received the vaccine, you still should get the vaccine during each pregnancy. Breastfeeding may pass some protective antibodies onto your baby By breastfeeding, you may pass some antibodies you have made in response to the vaccine to your baby. When you get a whooping cough vaccine during your pregnancy, you will have antibodies in your breast milk that you can share with your baby as soon as your milk comes in. However, your baby will not get protective antibodies immediately if you wait to get the whooping cough vaccine until after delivering your baby. This is because it takes about 2 weeks for your body to create antibodies. Learn more about the health benefits of breastfeeding.   RSV Vaccination for Pregnant People  CDC recommends two ways to protect babies from getting very sick with Respiratory Syncytial Virus (RSV):  An RSV vaccination given during pregnancy  Pfizer's vaccine Verdis Frederickson) is recommended for use during pregnancy. It is given during RSV season to people who are 32 through [redacted] weeks pregnant.  Or, An RSV immunization given directly to infants and some older babies  Babies born to mothers who get RSV vaccine at least 2 weeks before delivery will have protection and, in most cases, should not need an RSV immunization later.    When is RSV season?  In most regions of the [place] RSV season starts in the fall and peaks in the winter, but the timing and severity of RSV season can vary from place to place and year to year.   The goal of maternal RSV vaccination is to protect babies from getting very sick with RSV during their first RSV season.  In most of the [place], this means maternal RSV  vaccine will be given in September through January.  Who should get the maternal RSV vaccine?  People who are 60 through [redacted] weeks pregnant during September through January should get one dose of maternal RSV vaccine to protect their babies. RSV season can vary around the country.   How is the maternal RSV vaccine administered?  Maternal RSV vaccine is given as a shot into the mother's upper arm. Only a single dose (one shot) of maternal RSV vaccine is recommended.   It is not yet known whether another dose might be needed in later pregnancies.  How well does the maternal RSV vaccine work?  When someone gets RSV vaccine, their body responds by making a protein that protects against the virus that causes RSV. The process takes about 2 weeks. When a pregnant person gets RSV vaccine, their protective proteins (called antibodies) also pass to their baby. So, babies who are born at least 2 weeks after their mother gets RSV  vaccine are protected at birth, when infants are at the highest risk of severe RSV disease.   The vaccine can reduce a baby's risk of being hospitalized from RSV by 57% in the first six months after birth.  What are the possible side effects of the maternal RSV vaccine?  In the clinical trials, the side effects most often reported by pregnant people who received the maternal RSV vaccine were pain at the injection site, headache, muscle pain, and nausea.  Although not common, a dangerous high blood pressure condition called pre-eclampsia occurred in 1.8% of pregnant people who received the maternal RSV vaccine compared to 1.4% of pregnant people who received a placebo.  The clinical trials identified a small increase in the number of preterm births in vaccinated pregnant people. It is not clear if this is a true safety problem related to RSV vaccine or if this occurred for reasons unrelated to vaccination.  To reduce the potential risk of preterm birth and complications from RSV  disease, FDA approved the maternal RSV vaccine for use during weeks 32 through 58 of pregnancy while additional studies are conducted.  FDA is requiring the manufacturer to do additional studies that will look more closely at the potential risk of preterm births and pregnancy-related high blood pressure issues in mothers, including pre-eclampsia.  Severe allergic reactions to vaccines are rare but can happen after any vaccine and can be life-threatening. If you see signs of a severe allergic reaction after vaccination (hives, swelling of the face and throat, difficulty breathing, a fast heartbeat, dizziness, or weakness), seek immediate medical care by calling 911.  As with any medicine or vaccine there is a very remote chance of the vaccine causing other serious injury or death after vaccination.  Adverse events following vaccination should be reported to the Vaccine Adverse Event Reporting System (VAERS), even if it's not clear that the vaccine caused the adverse event. You or your doctor can report an adverse event to [facility] and FDA through VAERS. If you need further assistance reporting to VAERS, please email [email or web address] or call [phone].  If you have any questions about side effects from the maternal RSV vaccine, talk with your healthcare provider.  Do I need a prescription for a maternal RSV vaccine?  Until the vaccine available in the office, you will need a prescription to take to a local pharmacy that is providing the vaccine.   How do I pay for the maternal RSV vaccine?  Most private health insurance plans cover the maternal RSV vaccine, but there may be a cost to you depending on your plan.  Contact your insurer to find out.  Medicaid Beginning February 11, 2022, most people with coverage from [facility] and [employer] [facility]) will be guaranteed coverage of all vaccines recommended by the Advisory Committee on Immunization Practice at no cost to  them.   Source: [facility] for Immunization and Respiratory Diseases

## 2023-02-19 ENCOUNTER — Ambulatory Visit (INDEPENDENT_AMBULATORY_CARE_PROVIDER_SITE_OTHER): Payer: Medicaid Other | Admitting: Family Medicine

## 2023-02-19 ENCOUNTER — Other Ambulatory Visit: Payer: Self-pay

## 2023-02-19 ENCOUNTER — Inpatient Hospital Stay (HOSPITAL_COMMUNITY): Payer: Medicaid Other

## 2023-02-19 ENCOUNTER — Encounter (HOSPITAL_COMMUNITY): Payer: Self-pay | Admitting: Obstetrics and Gynecology

## 2023-02-19 ENCOUNTER — Inpatient Hospital Stay (HOSPITAL_COMMUNITY)
Admission: AD | Admit: 2023-02-19 | Discharge: 2023-02-19 | Disposition: A | Discharge status: Home / Self Care | Payer: Medicaid Other | Attending: Obstetrics and Gynecology | Admitting: Obstetrics and Gynecology

## 2023-02-19 VITALS — BP 124/77 | HR 102 | Wt 172.1 lb

## 2023-02-19 DIAGNOSIS — D573 Sickle-cell trait: Secondary | ICD-10-CM

## 2023-02-19 DIAGNOSIS — O98513 Other viral diseases complicating pregnancy, third trimester: Secondary | ICD-10-CM | POA: Insufficient documentation

## 2023-02-19 DIAGNOSIS — J069 Acute upper respiratory infection, unspecified: Secondary | ICD-10-CM | POA: Insufficient documentation

## 2023-02-19 DIAGNOSIS — O26893 Other specified pregnancy related conditions, third trimester: Secondary | ICD-10-CM | POA: Insufficient documentation

## 2023-02-19 DIAGNOSIS — B977 Papillomavirus as the cause of diseases classified elsewhere: Secondary | ICD-10-CM

## 2023-02-19 DIAGNOSIS — O4700 False labor before 37 completed weeks of gestation, unspecified trimester: Secondary | ICD-10-CM

## 2023-02-19 DIAGNOSIS — B9789 Other viral agents as the cause of diseases classified elsewhere: Secondary | ICD-10-CM | POA: Insufficient documentation

## 2023-02-19 DIAGNOSIS — Z3A32 32 weeks gestation of pregnancy: Secondary | ICD-10-CM | POA: Insufficient documentation

## 2023-02-19 DIAGNOSIS — Z3480 Encounter for supervision of other normal pregnancy, unspecified trimester: Secondary | ICD-10-CM

## 2023-02-19 DIAGNOSIS — O99513 Diseases of the respiratory system complicating pregnancy, third trimester: Secondary | ICD-10-CM | POA: Insufficient documentation

## 2023-02-19 DIAGNOSIS — Z302 Encounter for sterilization: Secondary | ICD-10-CM

## 2023-02-19 DIAGNOSIS — J4521 Mild intermittent asthma with (acute) exacerbation: Secondary | ICD-10-CM | POA: Diagnosis not present

## 2023-02-19 DIAGNOSIS — O99013 Anemia complicating pregnancy, third trimester: Secondary | ICD-10-CM

## 2023-02-19 DIAGNOSIS — Z1152 Encounter for screening for COVID-19: Secondary | ICD-10-CM | POA: Insufficient documentation

## 2023-02-19 DIAGNOSIS — Z87828 Personal history of other (healed) physical injury and trauma: Secondary | ICD-10-CM

## 2023-02-19 DIAGNOSIS — R87612 Low grade squamous intraepithelial lesion on cytologic smear of cervix (LGSIL): Secondary | ICD-10-CM

## 2023-02-19 DIAGNOSIS — J4541 Moderate persistent asthma with (acute) exacerbation: Secondary | ICD-10-CM

## 2023-02-19 LAB — CBC
HCT: 29.8 % — ABNORMAL LOW (ref 36.0–46.0)
Hemoglobin: 9.7 g/dL — ABNORMAL LOW (ref 12.0–15.0)
MCH: 25.8 pg — ABNORMAL LOW (ref 26.0–34.0)
MCHC: 32.6 g/dL (ref 30.0–36.0)
MCV: 79.3 fL — ABNORMAL LOW (ref 80.0–100.0)
Platelets: 223 10*3/uL (ref 150–400)
RBC: 3.76 MIL/uL — ABNORMAL LOW (ref 3.87–5.11)
RDW: 16.4 % — ABNORMAL HIGH (ref 11.5–15.5)
WBC: 12.8 10*3/uL — ABNORMAL HIGH (ref 4.0–10.5)
nRBC: 0 % (ref 0.0–0.2)

## 2023-02-19 LAB — RESP PANEL BY RT-PCR (RSV, FLU A&B, COVID)  RVPGX2
Influenza A by PCR: NEGATIVE
Influenza B by PCR: NEGATIVE
Resp Syncytial Virus by PCR: NEGATIVE
SARS Coronavirus 2 by RT PCR: NEGATIVE

## 2023-02-19 LAB — URINALYSIS, ROUTINE W REFLEX MICROSCOPIC
Bilirubin Urine: NEGATIVE
Glucose, UA: NEGATIVE mg/dL
Hgb urine dipstick: NEGATIVE
Ketones, ur: NEGATIVE mg/dL
Nitrite: NEGATIVE
Protein, ur: NEGATIVE mg/dL
Specific Gravity, Urine: 1.015 (ref 1.005–1.030)
pH: 5 (ref 5.0–8.0)

## 2023-02-19 MED ORDER — ALBUTEROL SULFATE HFA 108 (90 BASE) MCG/ACT IN AERS: 2.0000 | INHALATION_SPRAY | RESPIRATORY_TRACT | 2 refills | Status: AC | PRN
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
## 2023-02-19 NOTE — Discharge Instructions (Signed)
Meds safe for Colds/Coughs/Allergies: Benadryl (alcohol free) 25 mg every 6 hours as needed Breath right strips Claritin Cepacol throat lozenges Chloraseptic throat spray Cold-Eeze- up to three times per day Cough drops, alcohol free Flonase (by prescription only) Guaifenesin Mucinex Robitussin DM (plain only, alcohol free) Saline nasal spray/drops Sudafed (pseudoephedrine) & Actifed ** use only after [redacted] weeks gestation and if you do not have high blood pressure Tylenol Vicks Vaporub Zinc lozenges Zyrtec
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
## 2023-02-19 NOTE — MAU Note (Signed)
Audrey Pearson is a 32 y.o. at [redacted]w[redacted]d here in MAU reporting: sent from [facility] office due to body aches, sore throat, congestion, coughing and reported fevers at home. Reports every time she coughs she feels like she is peeing on herself, unsure if it amniotic fluid or not. Reports ongoing contractions that are sporadic and vaginal pressure. Reports the vaginal pressure feels different the last 2 weeks. Pt does have asthma and reports using her inhaler as needed. Is not taking any OTC flu and cold meds. Reports +FM and denies any vaginal bleeding.  Note from OB appointment this am: Patient doing current sick with what she believes is viral respiratory illness c/b asthma exacerbation. She reports contractions since illness started 10/4 and comes in waves that last about an hour of 3-60mins ctx and getting more frequent despite proper oral hydration with clear urine , fatigue, and nocturnal cough with associated F/C . She doesn't have daily controller but has rescue inhaler that she reports only using sometimes.   Onset of complaint: 02/15/23 Pain score: 7 abdomen and throat  Vitals:   02/19/23 1119  BP: 122/75  Pulse: 98  Resp: 18  Temp: 97.6 F (36.4 C)     FHT:125 Lab orders placed from triage:  UA, resp swab

## 2023-03-01 ENCOUNTER — Telehealth: Payer: Self-pay

## 2023-03-01 ENCOUNTER — Inpatient Hospital Stay (HOSPITAL_COMMUNITY)
Admission: AD | Admit: 2023-03-01 | Discharge: 2023-03-01 | Disposition: A | Discharge status: Home / Self Care | Payer: Medicaid Other | Attending: Obstetrics & Gynecology | Admitting: Obstetrics & Gynecology

## 2023-03-01 ENCOUNTER — Encounter (HOSPITAL_COMMUNITY): Payer: Self-pay | Admitting: Obstetrics & Gynecology

## 2023-03-01 DIAGNOSIS — Z0371 Encounter for suspected problem with amniotic cavity and membrane ruled out: Secondary | ICD-10-CM | POA: Diagnosis not present

## 2023-03-01 DIAGNOSIS — M549 Dorsalgia, unspecified: Secondary | ICD-10-CM | POA: Insufficient documentation

## 2023-03-01 DIAGNOSIS — O26893 Other specified pregnancy related conditions, third trimester: Secondary | ICD-10-CM | POA: Insufficient documentation

## 2023-03-01 DIAGNOSIS — Z3A33 33 weeks gestation of pregnancy: Secondary | ICD-10-CM | POA: Diagnosis not present

## 2023-03-01 DIAGNOSIS — O26899 Other specified pregnancy related conditions, unspecified trimester: Secondary | ICD-10-CM

## 2023-03-01 DIAGNOSIS — R109 Unspecified abdominal pain: Secondary | ICD-10-CM | POA: Insufficient documentation

## 2023-03-01 HISTORY — DX: Anemia, unspecified: D64.9

## 2023-03-01 HISTORY — DX: Unspecified abnormal cytological findings in specimens from vagina: R87.629

## 2023-03-01 LAB — URINALYSIS, ROUTINE W REFLEX MICROSCOPIC
Bilirubin Urine: NEGATIVE
Glucose, UA: NEGATIVE mg/dL
Hgb urine dipstick: NEGATIVE
Ketones, ur: NEGATIVE mg/dL
Leukocytes,Ua: NEGATIVE
Nitrite: NEGATIVE
Protein, ur: NEGATIVE mg/dL
Specific Gravity, Urine: 1.002 — ABNORMAL LOW (ref 1.005–1.030)
pH: 6 (ref 5.0–8.0)

## 2023-03-01 LAB — POCT FERN TEST: POCT Fern Test: NEGATIVE

## 2023-03-01 NOTE — MAU Note (Signed)
Productive cough continues, green mucous.   Denies fever. Has been having nose bleeds.

## 2023-03-01 NOTE — Telephone Encounter (Signed)
Called patient to follow up on after hours call yesterday afternoon stating that she lost her mucus plug, experiencing a lot of pressure, back pain, and period constant period cramps.  Patient was advised to go to MAU for evaluation. Patient did not go. States that she didn't have childcare.  Followed up with patient regarding sx. States that sx are unchanged. Advised that she needs to be evaluated for preterm labor and advised to go.  Patient verbalized understanding. Patient will try to make arrangement for childcare

## 2023-03-01 NOTE — MAU Note (Signed)
.  Audrey Pearson is a 32 y.o. at [redacted]w[redacted]d here in MAU reporting: lost part of her mucus plug yesterday  and more came out today. Has been having cramping off and on and stronger today. Good fetal movement felt. No leaking or bleeding. Reports some back pain as well.  LMP:  Onset of complaint: yesterday Pain score: 7 Vitals:   03/01/23 1112  BP: 121/61  Pulse: 99  Resp: 18  Temp: 97.9 F (36.6 C)  SpO2: 99%     FHT:141 Lab orders placed from triage:  u/a

## 2023-03-01 NOTE — MAU Provider Note (Signed)
History     CSN: [id number]  Arrival date and time: 03/01/23 1059   Event Date/Time   First Provider Initiated Contact with Patient 03/01/23 1153      Chief Complaint  Patient presents with   Contractions   Back Pain   Vaginal Discharge   HPI Audrey Pearson is a 32 y.o. K2H0623 at [redacted]w[redacted]d who presents for vaginal discharge & abdominal pain. Reports abdominal cramping all week. Worse when she coughs. Has had URI for last week, was evaluated for it in MAU last week. Has had some vaginal discharge recently as well. States she think she lost her mucous plug but has also noticed watery discharge, worse when she has coughing episode.  Denies n/v/d, dysuria, vaginal bleeding. Reports good fetal movement.   OB History     Gravida  5   Para  2   Term  2   Preterm  0   AB  2   Living  2      SAB  2   IAB  0   Ectopic  0   Multiple  0   Live Births  2           Past Medical History:  Diagnosis Date   Anemia    Anxiety    Asthma    Depression    IBS (irritable bowel syndrome)    w/ chronic constipation   Irritable bowel syndrome with constipation 08/03/2016   IBS-C   Mild intermittent asthma without complication 06/13/2016   Vaginal Pap smear, abnormal     Past Surgical History:  Procedure Laterality Date   WISDOM TOOTH EXTRACTION  01/2019    Family History  Adopted: Yes  Problem Relation Age of Onset   Breast cancer Mother    Alcohol abuse Mother    Drug abuse Mother    COPD Mother    Cancer Father    Alcohol abuse Father    Drug abuse Father    Pancreatic cancer Father    Sickle cell anemia Sister    Cancer - Cervical Sister    Drug abuse Brother    Lung cancer Paternal Grandmother    Colon cancer Neg Hx    Rectal cancer Neg Hx    Esophageal cancer Neg Hx    Liver cancer Neg Hx     Social History   Tobacco Use   Smoking status: Former    Current packs/day: 0.00    Types: Cigarettes    Quit date: 2019    Years since quitting: 5.8    Smokeless tobacco: Never   Tobacco comments:    Quit vaping prior to [employer] Use   Vaping status: Former   Substances: Flavoring  Substance Use Topics   Alcohol use: Not Currently    Comment: not since confirmed pregnancy   Drug use: Not Currently    Types: Marijuana    Comment: last used months ago    Allergies:  Allergies  Allergen Reactions   Bee Venom     Medications Prior to Admission  Medication Sig Dispense Refill Last Dose   albuterol (VENTOLIN HFA) 108 (90 Base) MCG/ACT inhaler Inhale 2 puffs into the lungs every 4 (four) hours as needed for wheezing or shortness of breath. 18 g 2 Past Week   Ferric Maltol 30 MG CAPS Take 1 capsule (30 mg total) by mouth 2 (two) times daily. Please take one hour before breakfast and dinner 60 capsule 2 03/01/2023   hydrocortisone (ANUSOL-HC)  2.5 % rectal cream Place rectally 2 (two) times daily. 30 g 0 Past Month   Albuterol-Budesonide (AIRSUPRA) 90-80 MCG/ACT AERO Inhale into the lungs.   More than a month   Blood Pressure Monitoring (BLOOD PRESSURE KIT) DEVI 1 Device by Does not apply route once a week. 1 each 0     Review of Systems Physical Exam   Blood pressure 120/63, pulse 96, temperature 97.9 F (36.6 C), resp. rate 18, height 5\' 6"  (1.676 m), weight 80.3 kg, last menstrual period 07/08/2022, SpO2 98%, unknown if currently breastfeeding.  Physical Exam Vitals and nursing note reviewed. Exam conducted with a chaperone present.  Constitutional:      General: She is not in acute distress.    Appearance: Normal appearance.  HENT:     Head: Normocephalic and atraumatic.  Abdominal:     Palpations: Abdomen is soft.     Tenderness: There is no abdominal tenderness.     Comments: gravid  Genitourinary:    Comments: SSE: No pooling. No blood. Physiologic discharge Neurological:     Mental Status: She is alert.  Psychiatric:        Mood and Affect: Mood normal.        Behavior: Behavior normal.    NST:  Baseline: 135  bpm, Variability: Good {> 6 bpm), Accelerations: Reactive, and Decelerations: Absent  MAU Course  Procedures Results for orders placed or performed during the hospital encounter of 03/01/23 (from the past 24 hour(s))  Urinalysis, Routine w reflex microscopic -Urine, Clean Catch     Status: Abnormal   Collection Time: 03/01/23 10:59 AM  Result Value Ref Range   Color, Urine STRAW (A) YELLOW   APPearance CLEAR CLEAR   Specific Gravity, Urine 1.002 (L) 1.005 - 1.030   pH 6.0 5.0 - 8.0   Glucose, UA NEGATIVE NEGATIVE mg/dL   Hgb urine dipstick NEGATIVE NEGATIVE   Bilirubin Urine NEGATIVE NEGATIVE   Ketones, ur NEGATIVE NEGATIVE mg/dL   Protein, ur NEGATIVE NEGATIVE mg/dL   Nitrite NEGATIVE NEGATIVE   Leukocytes,Ua NEGATIVE NEGATIVE  POCT fern test     Status: Normal   Collection Time: 03/01/23 12:13 PM  Result Value Ref Range   POCT Fern Test Negative = intact amniotic membranes     MDM NST SSE - no pooling. Fern negative Cervix closed U/a without signs of infection  Assessment and Plan   1. Encounter for suspected PROM, with rupture of membranes not found   2. Abdominal cramping affecting pregnancy   3. [redacted] weeks gestation of pregnancy    -SSE performed. No pooling of fluid & fern negative -No ctx on monitor. Cervix closed/thick -Reviewed return precautions  Judeth Horn 03/01/2023, 11:53 AM

## 2023-03-02 NOTE — Plan of Care (Signed)
[facility] Tonsillectomy/Adenoidectomy, Postoperative PEDS care plan entered in error.

## 2023-03-05 ENCOUNTER — Inpatient Hospital Stay (HOSPITAL_COMMUNITY)
Admission: AD | Admit: 2023-03-05 | Discharge: 2023-03-05 | Disposition: A | Discharge status: Home / Self Care | Payer: Medicaid Other | Attending: Obstetrics and Gynecology | Admitting: Obstetrics and Gynecology

## 2023-03-05 ENCOUNTER — Other Ambulatory Visit: Payer: Self-pay | Admitting: Advanced Practice Midwife

## 2023-03-05 ENCOUNTER — Encounter: Payer: Self-pay | Admitting: Advanced Practice Midwife

## 2023-03-05 ENCOUNTER — Telehealth: Payer: Self-pay

## 2023-03-05 ENCOUNTER — Encounter (HOSPITAL_COMMUNITY): Payer: Self-pay | Admitting: Obstetrics and Gynecology

## 2023-03-05 ENCOUNTER — Ambulatory Visit (INDEPENDENT_AMBULATORY_CARE_PROVIDER_SITE_OTHER): Payer: Medicaid Other | Admitting: Advanced Practice Midwife

## 2023-03-05 VITALS — BP 118/71 | HR 108 | Wt 176.0 lb

## 2023-03-05 DIAGNOSIS — Z3689 Encounter for other specified antenatal screening: Secondary | ICD-10-CM | POA: Insufficient documentation

## 2023-03-05 DIAGNOSIS — R102 Pelvic and perineal pain: Secondary | ICD-10-CM

## 2023-03-05 DIAGNOSIS — O99013 Anemia complicating pregnancy, third trimester: Secondary | ICD-10-CM | POA: Insufficient documentation

## 2023-03-05 DIAGNOSIS — O4703 False labor before 37 completed weeks of gestation, third trimester: Secondary | ICD-10-CM | POA: Diagnosis not present

## 2023-03-05 DIAGNOSIS — Z3A34 34 weeks gestation of pregnancy: Secondary | ICD-10-CM

## 2023-03-05 DIAGNOSIS — D649 Anemia, unspecified: Secondary | ICD-10-CM | POA: Insufficient documentation

## 2023-03-05 DIAGNOSIS — R87612 Low grade squamous intraepithelial lesion on cytologic smear of cervix (LGSIL): Secondary | ICD-10-CM

## 2023-03-05 DIAGNOSIS — Z302 Encounter for sterilization: Secondary | ICD-10-CM

## 2023-03-05 DIAGNOSIS — D573 Sickle-cell trait: Secondary | ICD-10-CM | POA: Diagnosis not present

## 2023-03-05 DIAGNOSIS — Z3480 Encounter for supervision of other normal pregnancy, unspecified trimester: Secondary | ICD-10-CM

## 2023-03-05 DIAGNOSIS — O26893 Other specified pregnancy related conditions, third trimester: Secondary | ICD-10-CM

## 2023-03-05 LAB — URINALYSIS, ROUTINE W REFLEX MICROSCOPIC
Bilirubin Urine: NEGATIVE
Glucose, UA: NEGATIVE mg/dL
Hgb urine dipstick: NEGATIVE
Ketones, ur: NEGATIVE mg/dL
Leukocytes,Ua: NEGATIVE
Nitrite: NEGATIVE
Protein, ur: NEGATIVE mg/dL
Specific Gravity, Urine: 1.009 (ref 1.005–1.030)
pH: 6 (ref 5.0–8.0)

## 2023-03-05 MED ORDER — NIFEDIPINE 20 MG PO CAPS
20.0000 mg | ORAL_CAPSULE | Freq: Three times a day (TID) | ORAL | 0 refills | Status: DC
Start: 2023-03-05 — End: 2023-03-05

## 2023-03-05 MED ORDER — NIFEDIPINE 10 MG PO CAPS
10.0000 mg | ORAL_CAPSULE | ORAL | Status: AC
Start: 2023-03-05 — End: 2023-03-05
  Administered 2023-03-05 (×2): 10 mg via ORAL
  Filled 2023-03-05 (×2): qty 1

## 2023-03-05 MED ORDER — NIFEDIPINE 20 MG PO CAPS: 20.0000 mg | ORAL_CAPSULE | Freq: Three times a day (TID) | ORAL | 0 refills | Status: DC

## 2023-03-05 NOTE — Telephone Encounter (Signed)
Audrey Pearson, patient will be scheduled as soon as possible  Auth Submission: NO AUTH NEEDED Site of care: Site of care: [facility] Payer: [employer] Medication & CPT/J Code(s) submitted: Venofer (Iron Sucrose) J1756 Route of submission (phone, fax, portal):  Phone # Fax # Auth type: Buy/Bill PB Units/visits requested: 200mg  x 5 doses Reference number:  Approval from: 03/05/23 to 05/14/23

## 2023-03-05 NOTE — Progress Notes (Signed)
Pt reports fetal movement with some pressure and irritability, denies bleeding. Pt states that she has been slowly losing her mucus plug over the last 2 weeks
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
## 2023-03-05 NOTE — MAU Note (Signed)
Audrey Pearson is a 32 y.o. at [redacted]w[redacted]d here in MAU reporting: Pt had an office appt today and they checked her cervix and pt reports she was 1 cm. Pt states she's been ctx on and off for awhile but it got worse yesterday and today. Pt doesn't know how far apart ctx are but pt rates the pain 7/10 in her lower abdomen. Pt denies LOF or VB. +FM   Onset of complaint: yesterday  Pain score: 7/10 L ab Vitals:   03/05/23 1908  BP: 126/74  Pulse: 92  Resp: 19  Temp: 97.8 F (36.6 C)  SpO2: 99%     FHT:138 Lab orders placed from triage:

## 2023-03-06 ENCOUNTER — Telehealth: Payer: Self-pay

## 2023-03-06 ENCOUNTER — Other Ambulatory Visit: Payer: Self-pay | Admitting: Obstetrics and Gynecology

## 2023-03-06 MED ORDER — NIFEDIPINE 10 MG PO CAPS: 10.0000 mg | ORAL_CAPSULE | Freq: Three times a day (TID) | ORAL | 1 refills | Status: DC | PRN

## 2023-03-06 NOTE — Telephone Encounter (Signed)
Left detailed message on pt vm informing of new rx

## 2023-03-06 NOTE — Telephone Encounter (Signed)
Patient called stating that she was seen at [facility]U on yesterday for contractions. She was given Procardia 10 mg while there, but was prescribed 20 mg to take at home. She states that the 20 mg is to strong for her causing her to feel dizzy. Patient request for the 10 mg rx to be sent to the pharmacy.

## 2023-03-08 LAB — URINE CULTURE, OB REFLEX: Organism ID, Bacteria: NO GROWTH

## 2023-03-08 LAB — CULTURE, OB URINE

## 2023-03-11 ENCOUNTER — Ambulatory Visit (INDEPENDENT_AMBULATORY_CARE_PROVIDER_SITE_OTHER): Payer: Medicaid Other

## 2023-03-11 VITALS — BP 106/55 | HR 87 | Temp 98.2°F | Resp 18 | Ht 66.0 in | Wt 175.4 lb

## 2023-03-11 DIAGNOSIS — D508 Other iron deficiency anemias: Secondary | ICD-10-CM

## 2023-03-11 DIAGNOSIS — O99013 Anemia complicating pregnancy, third trimester: Secondary | ICD-10-CM

## 2023-03-11 DIAGNOSIS — Z3A35 35 weeks gestation of pregnancy: Secondary | ICD-10-CM | POA: Diagnosis not present

## 2023-03-11 MED ORDER — DIPHENHYDRAMINE HCL 25 MG PO CAPS
25.0000 mg | ORAL_CAPSULE | Freq: Once | ORAL | Status: AC
Start: 2023-03-11 — End: 2023-03-11
  Administered 2023-03-11: 25 mg via ORAL
  Filled 2023-03-11: qty 1

## 2023-03-11 MED ORDER — IRON SUCROSE 20 MG/ML IV SOLN
200.0000 mg | Freq: Once | INTRAVENOUS | Status: AC
Start: 2023-03-11 — End: 2023-03-11
  Administered 2023-03-11: 200 mg via INTRAVENOUS
  Filled 2023-03-11: qty 10

## 2023-03-11 MED ORDER — ACETAMINOPHEN 325 MG PO TABS
650.0000 mg | ORAL_TABLET | Freq: Once | ORAL | Status: AC
Start: 2023-03-11 — End: 2023-03-11
  Administered 2023-03-11: 650 mg via ORAL
  Filled 2023-03-11: qty 2

## 2023-03-11 NOTE — Patient Instructions (Signed)
Iron Sucrose Injection What is this medication? IRON SUCROSE (EYE ern SOO krose) treats low levels of iron (iron deficiency anemia) in people with kidney disease. Iron is a mineral that plays an important role in making red blood cells, which carry oxygen from your lungs to the rest of your body. This medicine may be used for other purposes; ask your health care provider or pharmacist if you have questions. COMMON BRAND NAME(S): Venofer What should I tell my care team before I take this medication? They need to know if you have any of these conditions: Anemia not caused by low iron levels Heart disease High levels of iron in the blood Kidney disease Liver disease An unusual or allergic reaction to iron, other medications, foods, dyes, or preservatives Pregnant or trying to get pregnant Breastfeeding How should I use this medication? This medication is for infusion into a vein. It is given in a hospital or clinic setting. Talk to your care team about the use of this medication in children. While this medication may be prescribed for children as young as 2 years for selected conditions, precautions do apply. Overdosage: If you think you have taken too much of this medicine contact a poison control center or emergency room at once. NOTE: This medicine is only for you. Do not share this medicine with others. What if I miss a dose? Keep appointments for follow-up doses. It is important not to miss your dose. Call your care team if you are unable to keep an appointment. What may interact with this medication? Do not take this medication with any of the following: Deferoxamine Dimercaprol Other iron products This medication may also interact with the following: Chloramphenicol Deferasirox This list may not describe all possible interactions. Give your health care provider a list of all the medicines, herbs, non-prescription drugs, or dietary supplements you use. Also tell them if you smoke,  drink alcohol, or use illegal drugs. Some items may interact with your medicine. What should I watch for while using this medication? Visit your care team regularly. Tell your care team if your symptoms do not start to get better or if they get worse. You may need blood work done while you are taking this medication. You may need to follow a special diet. Talk to your care team. Foods that contain iron include: whole grains/cereals, dried fruits, beans, or peas, leafy green vegetables, and organ meats (liver, kidney). What side effects may I notice from receiving this medication? Side effects that you should report to your care team as soon as possible: Allergic reactions--skin rash, itching, hives, swelling of the face, lips, tongue, or throat Low blood pressure--dizziness, feeling faint or lightheaded, blurry vision Shortness of breath Side effects that usually do not require medical attention (report to your care team if they continue or are bothersome): Flushing Headache Joint pain Muscle pain Nausea Pain, redness, or irritation at injection site This list may not describe all possible side effects. Call your doctor for medical advice about side effects. You may report side effects to FDA at 1-800-FDA-1088. Where should I keep my medication? This medication is given in a hospital or clinic. It will not be stored at home. NOTE: This sheet is a summary. It may not cover all possible information. If you have questions about this medicine, talk to your doctor, pharmacist, or health care provider.  2024 Elsevier/Gold Standard (2022-10-05 00:00:00)

## 2023-03-11 NOTE — Progress Notes (Signed)
Diagnosis: Iron Deficiency Anemia  Provider:  Chilton Greathouse MD  Procedure: IV Infusion  IV Type: Peripheral, IV Location: L Antecubital  Venofer (Iron Sucrose), Dose: 200 mg  Infusion Start Time: 1459  Infusion Stop Time: 1506  Post Infusion IV Care: Observation period completed and Peripheral IV Discontinued  Discharge: Condition: Good, Destination: Home . AVS Provided  Performed by:  Rico Ala, LPN

## 2023-03-12 ENCOUNTER — Encounter: Payer: Self-pay | Admitting: Obstetrics and Gynecology

## 2023-03-12 ENCOUNTER — Ambulatory Visit (INDEPENDENT_AMBULATORY_CARE_PROVIDER_SITE_OTHER): Payer: Medicaid Other | Admitting: Obstetrics and Gynecology

## 2023-03-12 VITALS — BP 106/62 | HR 86 | Wt 177.0 lb

## 2023-03-12 DIAGNOSIS — O99013 Anemia complicating pregnancy, third trimester: Secondary | ICD-10-CM

## 2023-03-12 DIAGNOSIS — Z3A35 35 weeks gestation of pregnancy: Secondary | ICD-10-CM

## 2023-03-12 DIAGNOSIS — D573 Sickle-cell trait: Secondary | ICD-10-CM

## 2023-03-12 DIAGNOSIS — Z3480 Encounter for supervision of other normal pregnancy, unspecified trimester: Secondary | ICD-10-CM

## 2023-03-12 DIAGNOSIS — Z302 Encounter for sterilization: Secondary | ICD-10-CM

## 2023-03-12 NOTE — Progress Notes (Signed)
Pt had iron infusion yesterday.  Pt states she has been contracting since yesterday -  irregular but frequent.
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
## 2023-03-13 ENCOUNTER — Ambulatory Visit (INDEPENDENT_AMBULATORY_CARE_PROVIDER_SITE_OTHER): Payer: Medicaid Other

## 2023-03-13 VITALS — BP 127/75 | HR 84 | Temp 98.0°F | Resp 14 | Ht 66.0 in | Wt 175.2 lb

## 2023-03-13 DIAGNOSIS — D508 Other iron deficiency anemias: Secondary | ICD-10-CM | POA: Diagnosis not present

## 2023-03-13 DIAGNOSIS — Z3A35 35 weeks gestation of pregnancy: Secondary | ICD-10-CM

## 2023-03-13 DIAGNOSIS — O99013 Anemia complicating pregnancy, third trimester: Secondary | ICD-10-CM

## 2023-03-13 MED ORDER — DIPHENHYDRAMINE HCL 25 MG PO CAPS
25.0000 mg | ORAL_CAPSULE | Freq: Once | ORAL | Status: AC
  Administered 2023-03-13: 25 mg via ORAL
  Filled 2023-03-13: qty 1

## 2023-03-13 MED ORDER — ACETAMINOPHEN 325 MG PO TABS
650.0000 mg | ORAL_TABLET | Freq: Once | ORAL | Status: AC
  Administered 2023-03-13: 650 mg via ORAL
  Filled 2023-03-13: qty 2

## 2023-03-13 MED ORDER — IRON SUCROSE 20 MG/ML IV SOLN
200.0000 mg | Freq: Once | INTRAVENOUS | Status: AC
  Administered 2023-03-13: 200 mg via INTRAVENOUS
  Filled 2023-03-13: qty 10

## 2023-03-13 NOTE — Progress Notes (Signed)
Diagnosis: Iron Deficiency Anemia  Provider:  Chilton Greathouse MD  Procedure: IV Push  IV Type: Peripheral, IV Location: R Antecubital  Venofer (Iron Sucrose), Dose: 200 mg  Post Infusion IV Care: Observation period completed and Peripheral IV Discontinued  Discharge: Condition: Good, Destination: Home . AVS Declined  Performed by:  Loney Hering, LPN

## 2023-03-14 ENCOUNTER — Ambulatory Visit: Payer: Medicaid Other | Attending: Advanced Practice Midwife | Admitting: Physical Therapy

## 2023-03-14 ENCOUNTER — Other Ambulatory Visit: Payer: Self-pay

## 2023-03-14 DIAGNOSIS — M62838 Other muscle spasm: Secondary | ICD-10-CM | POA: Insufficient documentation

## 2023-03-14 DIAGNOSIS — M6281 Muscle weakness (generalized): Secondary | ICD-10-CM | POA: Insufficient documentation

## 2023-03-14 DIAGNOSIS — R279 Unspecified lack of coordination: Secondary | ICD-10-CM | POA: Insufficient documentation

## 2023-03-14 DIAGNOSIS — R293 Abnormal posture: Secondary | ICD-10-CM | POA: Diagnosis present
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
## 2023-03-15 ENCOUNTER — Ambulatory Visit (INDEPENDENT_AMBULATORY_CARE_PROVIDER_SITE_OTHER): Payer: Medicaid Other

## 2023-03-15 VITALS — BP 112/70 | HR 100 | Temp 98.1°F | Resp 16 | Ht 65.0 in | Wt 176.6 lb

## 2023-03-15 DIAGNOSIS — O99013 Anemia complicating pregnancy, third trimester: Secondary | ICD-10-CM | POA: Diagnosis not present

## 2023-03-15 DIAGNOSIS — D508 Other iron deficiency anemias: Secondary | ICD-10-CM | POA: Diagnosis not present

## 2023-03-15 DIAGNOSIS — Z3A35 35 weeks gestation of pregnancy: Secondary | ICD-10-CM

## 2023-03-15 MED ORDER — DIPHENHYDRAMINE HCL 25 MG PO CAPS
25.0000 mg | ORAL_CAPSULE | Freq: Once | ORAL | Status: DC
Start: 2023-03-15 — End: 2023-03-15

## 2023-03-15 MED ORDER — ACETAMINOPHEN 325 MG PO TABS
650.0000 mg | ORAL_TABLET | Freq: Once | ORAL | Status: AC
  Administered 2023-03-15: 650 mg via ORAL
  Filled 2023-03-15: qty 2

## 2023-03-15 MED ORDER — IRON SUCROSE 20 MG/ML IV SOLN
200.0000 mg | Freq: Once | INTRAVENOUS | Status: AC
  Administered 2023-03-15: 200 mg via INTRAVENOUS
  Filled 2023-03-15: qty 10

## 2023-03-15 NOTE — Progress Notes (Signed)
Diagnosis: Acute Anemia  Provider:  Chilton Greathouse MD  Procedure: IV Push  IV Type: Peripheral, IV Location: L Forearm  Venofer (Iron Sucrose), Dose: 200 mg  Post Infusion IV Care: Observation period completed and Peripheral IV Discontinued  Discharge: Condition: Stable, Destination: Home . AVS Declined  Performed by:  Wyvonne Lenz, RN

## 2023-03-17 ENCOUNTER — Inpatient Hospital Stay (HOSPITAL_COMMUNITY)
Admission: AD | Admit: 2023-03-17 | Discharge: 2023-03-17 | Disposition: A | Discharge status: Home / Self Care | Payer: Medicaid Other | Attending: Obstetrics and Gynecology | Admitting: Obstetrics and Gynecology

## 2023-03-17 ENCOUNTER — Other Ambulatory Visit: Payer: Self-pay

## 2023-03-17 ENCOUNTER — Encounter (HOSPITAL_COMMUNITY): Payer: Self-pay | Admitting: Obstetrics and Gynecology

## 2023-03-17 DIAGNOSIS — Z3A36 36 weeks gestation of pregnancy: Secondary | ICD-10-CM

## 2023-03-17 DIAGNOSIS — O4703 False labor before 37 completed weeks of gestation, third trimester: Secondary | ICD-10-CM | POA: Diagnosis present

## 2023-03-17 DIAGNOSIS — O479 False labor, unspecified: Secondary | ICD-10-CM

## 2023-03-17 NOTE — MAU Note (Signed)
.  Audrey Pearson is a 32 y.o. at [redacted]w[redacted]d here in MAU reporting: ctx all day since 1000 - have been every 3-5 minutes since 0000. Denies VB or LOF. +FM   Onset of complaint: 0000 Pain score: 8 Vitals:   03/17/23 0126  BP: (!) 121/55  Pulse: (!) 104  Resp: 20  Temp: 98.5 F (36.9 C)  SpO2: 99%     FHT:132 Lab orders placed from triage:  labor eval

## 2023-03-17 NOTE — MAU Provider Note (Signed)
Labor Check      S: [name] is a 32 y.o. [phone] at [redacted]w[redacted]d  who presents to MAU today complaining contractions q 3-5 minutes since 0001. She denies vaginal bleeding. She denies LOF. She reports normal fetal movement.    O: BP (!) 121/55 (BP Location: Right Arm)   Pulse (!) 104   Temp 98.5 F (36.9 C) (Oral)   Resp 20   Ht 5\' 6"  (1.676 m)   Wt 80.9 kg   LMP 07/08/2022   SpO2 99%   BMI 28.78 kg/m  GENERAL: Well-developed, well-nourished female in no acute distress.  HEAD: Normocephalic, atraumatic.  CHEST: Normal effort of breathing, regular heart rate ABDOMEN: Soft, nontender, gravid  Cervical exam:  Dilation: 2 Effacement (%): Thick Station: Ballotable Exam by:: A RODRIGUEZ RN   Fetal Monitoring: Baseline: 120 Variability: moderate variability Accelerations: + Decelerations: none Contractions: every 10-67mins   A: SIUP at [redacted]w[redacted]d  False labor  P: #[redacted] weeks gestation #False Labor Stable for discharge, given usual/strict return precautions   Hessie Dibble, MD 03/17/2023 2:14 AM

## 2023-03-18 ENCOUNTER — Ambulatory Visit: Payer: Medicaid Other

## 2023-03-18 VITALS — BP 109/69 | HR 103 | Temp 98.6°F | Resp 16 | Ht 66.0 in | Wt 176.8 lb

## 2023-03-18 DIAGNOSIS — Z3A36 36 weeks gestation of pregnancy: Secondary | ICD-10-CM

## 2023-03-18 DIAGNOSIS — D508 Other iron deficiency anemias: Secondary | ICD-10-CM | POA: Diagnosis not present

## 2023-03-18 DIAGNOSIS — O99013 Anemia complicating pregnancy, third trimester: Secondary | ICD-10-CM

## 2023-03-18 MED ORDER — DIPHENHYDRAMINE HCL 25 MG PO CAPS
25.0000 mg | ORAL_CAPSULE | Freq: Once | ORAL | Status: DC
Start: 2023-03-18 — End: 2023-03-18

## 2023-03-18 MED ORDER — ACETAMINOPHEN 325 MG PO TABS
650.0000 mg | ORAL_TABLET | Freq: Once | ORAL | Status: AC
  Administered 2023-03-18: 650 mg via ORAL
  Filled 2023-03-18: qty 2

## 2023-03-18 MED ORDER — IRON SUCROSE 20 MG/ML IV SOLN
200.0000 mg | Freq: Once | INTRAVENOUS | Status: AC
  Administered 2023-03-18: 200 mg via INTRAVENOUS
  Filled 2023-03-18: qty 10

## 2023-03-18 NOTE — Progress Notes (Signed)
Diagnosis: Iron Deficiency Anemia  Provider:  Chilton Greathouse MD  Procedure: IV Push  IV Type: Peripheral, IV Location: L Antecubital  Venofer (Iron Sucrose), Dose: 200 mg  Post Infusion IV Care: Observation period completed and Peripheral IV Discontinued  Discharge: Condition: Good, Destination: Home . AVS Declined  Performed by:  Loney Hering, LPN

## 2023-03-19 ENCOUNTER — Other Ambulatory Visit (HOSPITAL_COMMUNITY)
Admission: RE | Admit: 2023-03-19 | Discharge: 2023-03-19 | Disposition: A | Discharge status: Home / Self Care | Payer: Medicaid Other | Source: Ambulatory Visit | Attending: Advanced Practice Midwife | Admitting: Advanced Practice Midwife

## 2023-03-19 ENCOUNTER — Ambulatory Visit (INDEPENDENT_AMBULATORY_CARE_PROVIDER_SITE_OTHER): Payer: Medicaid Other | Admitting: Advanced Practice Midwife

## 2023-03-19 VITALS — BP 117/77 | HR 98 | Wt 178.0 lb

## 2023-03-19 DIAGNOSIS — Z3480 Encounter for supervision of other normal pregnancy, unspecified trimester: Secondary | ICD-10-CM | POA: Insufficient documentation

## 2023-03-19 DIAGNOSIS — Z302 Encounter for sterilization: Secondary | ICD-10-CM

## 2023-03-19 DIAGNOSIS — O99013 Anemia complicating pregnancy, third trimester: Secondary | ICD-10-CM

## 2023-03-19 DIAGNOSIS — R87612 Low grade squamous intraepithelial lesion on cytologic smear of cervix (LGSIL): Secondary | ICD-10-CM

## 2023-03-19 DIAGNOSIS — Z3A36 36 weeks gestation of pregnancy: Secondary | ICD-10-CM
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
## 2023-03-20 ENCOUNTER — Ambulatory Visit: Payer: Medicaid Other

## 2023-03-20 VITALS — BP 108/66 | HR 98 | Temp 98.0°F | Resp 18 | Ht 66.0 in | Wt 179.0 lb

## 2023-03-20 DIAGNOSIS — Z3A36 36 weeks gestation of pregnancy: Secondary | ICD-10-CM | POA: Diagnosis not present

## 2023-03-20 DIAGNOSIS — O99013 Anemia complicating pregnancy, third trimester: Secondary | ICD-10-CM | POA: Diagnosis not present

## 2023-03-20 DIAGNOSIS — D508 Other iron deficiency anemias: Secondary | ICD-10-CM | POA: Diagnosis not present

## 2023-03-20 LAB — CERVICOVAGINAL ANCILLARY ONLY
Chlamydia: NEGATIVE
Comment: NEGATIVE
Comment: NEGATIVE
Comment: NORMAL
Neisseria Gonorrhea: NEGATIVE
Trichomonas: NEGATIVE

## 2023-03-20 MED ORDER — ACETAMINOPHEN 325 MG PO TABS
650.0000 mg | ORAL_TABLET | Freq: Once | ORAL | Status: AC
Start: 2023-03-20 — End: 2023-03-20
  Administered 2023-03-20: 650 mg via ORAL
  Filled 2023-03-20: qty 2

## 2023-03-20 MED ORDER — IRON SUCROSE 20 MG/ML IV SOLN
200.0000 mg | Freq: Once | INTRAVENOUS | Status: AC
  Administered 2023-03-20: 200 mg via INTRAVENOUS
  Filled 2023-03-20: qty 10

## 2023-03-20 MED ORDER — DIPHENHYDRAMINE HCL 25 MG PO CAPS: 25.0000 mg | ORAL_CAPSULE | Freq: Once | ORAL | Status: DC

## 2023-03-20 NOTE — Progress Notes (Signed)
Diagnosis: Iron Deficiency Anemia  Provider:  Chilton Greathouse MD  Procedure:  IV Push  IV Type: Peripheral, IV Location: R Forearm  Venofer (Iron Sucrose), Dose: 200 mg  Infusion Start Time: 1152   Post Infusion IV Care: Patient declined observation and Peripheral IV Discontinued  Discharge: Condition: Good, Destination: Home . AVS Declined  Performed by:  Garnette Czech, RN

## 2023-03-23 LAB — CULTURE, BETA STREP (GROUP B ONLY): Strep Gp B Culture: NEGATIVE

## 2023-03-26 ENCOUNTER — Ambulatory Visit (INDEPENDENT_AMBULATORY_CARE_PROVIDER_SITE_OTHER): Payer: Medicaid Other | Admitting: Advanced Practice Midwife

## 2023-03-26 VITALS — BP 117/71 | HR 96 | Wt 178.9 lb

## 2023-03-26 DIAGNOSIS — Z302 Encounter for sterilization: Secondary | ICD-10-CM

## 2023-03-26 DIAGNOSIS — D573 Sickle-cell trait: Secondary | ICD-10-CM

## 2023-03-26 DIAGNOSIS — R87612 Low grade squamous intraepithelial lesion on cytologic smear of cervix (LGSIL): Secondary | ICD-10-CM

## 2023-03-26 DIAGNOSIS — Z3A37 37 weeks gestation of pregnancy: Secondary | ICD-10-CM

## 2023-03-26 DIAGNOSIS — O99013 Anemia complicating pregnancy, third trimester: Secondary | ICD-10-CM | POA: Diagnosis not present

## 2023-03-26 DIAGNOSIS — Z3480 Encounter for supervision of other normal pregnancy, unspecified trimester: Secondary | ICD-10-CM

## 2023-03-26 NOTE — Progress Notes (Signed)
Pt presents for ROB visit. Pt c/o pressure and contractions. Requesting cervical check
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
## 2023-03-27 ENCOUNTER — Encounter: Payer: Medicaid Other | Admitting: Obstetrics & Gynecology

## 2023-04-02 ENCOUNTER — Encounter: Payer: Self-pay | Admitting: Advanced Practice Midwife

## 2023-04-02 ENCOUNTER — Ambulatory Visit (INDEPENDENT_AMBULATORY_CARE_PROVIDER_SITE_OTHER): Payer: Medicaid Other | Admitting: Advanced Practice Midwife

## 2023-04-02 VITALS — BP 112/70 | HR 92 | Wt 180.0 lb

## 2023-04-02 DIAGNOSIS — Z3A38 38 weeks gestation of pregnancy: Secondary | ICD-10-CM

## 2023-04-02 DIAGNOSIS — O99013 Anemia complicating pregnancy, third trimester: Secondary | ICD-10-CM

## 2023-04-02 DIAGNOSIS — Z3480 Encounter for supervision of other normal pregnancy, unspecified trimester: Secondary | ICD-10-CM

## 2023-04-02 DIAGNOSIS — R87612 Low grade squamous intraepithelial lesion on cytologic smear of cervix (LGSIL): Secondary | ICD-10-CM

## 2023-04-02 DIAGNOSIS — Z302 Encounter for sterilization: Secondary | ICD-10-CM
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
## 2023-04-02 NOTE — Progress Notes (Signed)
Pt presents for ROB visit. Pt c/o contraction 7-10 mins apart. Requesting cervical check

## 2023-04-03 ENCOUNTER — Encounter: Payer: Medicaid Other | Admitting: Obstetrics and Gynecology

## 2023-04-05 ENCOUNTER — Ambulatory Visit (HOSPITAL_COMMUNITY)
Admission: AD | Admit: 2023-04-05 | Discharge: 2023-04-06 | Disposition: A | Discharge status: Home / Self Care | Payer: Medicaid Other | Source: Home / Self Care | Attending: Obstetrics & Gynecology | Admitting: Obstetrics & Gynecology

## 2023-04-05 ENCOUNTER — Encounter (HOSPITAL_COMMUNITY): Payer: Self-pay | Admitting: Obstetrics & Gynecology

## 2023-04-05 DIAGNOSIS — Z0371 Encounter for suspected problem with amniotic cavity and membrane ruled out: Secondary | ICD-10-CM | POA: Insufficient documentation

## 2023-04-05 DIAGNOSIS — Z3A38 38 weeks gestation of pregnancy: Secondary | ICD-10-CM

## 2023-04-05 DIAGNOSIS — Z3689 Encounter for other specified antenatal screening: Secondary | ICD-10-CM

## 2023-04-05 DIAGNOSIS — O471 False labor at or after 37 completed weeks of gestation: Secondary | ICD-10-CM

## 2023-04-05 DIAGNOSIS — Z3480 Encounter for supervision of other normal pregnancy, unspecified trimester: Secondary | ICD-10-CM

## 2023-04-05 LAB — POCT FERN TEST
POCT Fern Test: NEGATIVE
POCT Fern Test: NEGATIVE

## 2023-04-05 NOTE — MAU Note (Signed)
Pt says UC strong since 7pm PNC- [facility]- 2 cm on Tuesday  Denies HSV GBS- neg Feels pain in chest x4 days- like trapped gas. Feels baby moving

## 2023-04-05 NOTE — MAU Provider Note (Signed)
Event Date/Time   First Provider Initiated Contact with Patient 04/05/23 2216       S: [name] is a 32 y.o. [id number] at [redacted]w[redacted]d  who presents to MAU today for contractions.  However, she reports one incident of leaking after urinating. She denies vaginal bleeding. She endorses contractions. She reports normal fetal movement.    O: BP 115/78   Pulse 93   Temp 98.5 F (36.9 C) (Oral)   Resp 18   Ht 5\' 6"  (1.676 m)   Wt 83.1 kg   LMP 07/08/2022   SpO2 99%   BMI 29.55 kg/m  GENERAL: Well-developed, well-nourished female in no acute distress.  HEAD: Normocephalic, atraumatic.  CHEST: Normal effort of breathing, regular heart rate ABDOMEN: Soft, nontender, gravid PELVIC: Normal external female genitalia. Vagina is pink and rugated. Cervix with normal contour, no lesions. Normal discharge.  Negative pooling. Fern Collected  Cervical exam:  Dilation: 3 Effacement (%): 50 Cervical Position: Posterior Station: -3 Presentation: Vertex Exam by:: Anastasio Champion, RN   Fetal Monitoring: FHT: 120 bpm, Mod Var, -Decels, +15 x 15 Accels Toco: Irregular  Results for orders placed or performed during the hospital encounter of 04/05/23 (from the past 24 hour(s))  Fern Test     Status: None   Collection Time: 04/05/23  9:52 PM  Result Value Ref Range   POCT Fern Test Negative = intact amniotic membranes      A: SIUP at [redacted]w[redacted]d  Membranes intact Cat I FT  P: -Fern negative. -NST reactive -Monitor and reassess for labor progression.   Gerrit Heck, CNM 04/05/2023 10:17 PM  Reassessment (11:41 PM) -Nurse reports no cervical change after evaluation. -Discharge to home with precautions.  Cherre Robins MSN, CNM Advanced Practice Provider, Center for [employer]

## 2023-04-07 ENCOUNTER — Inpatient Hospital Stay (HOSPITAL_COMMUNITY)
Admission: RE | Admit: 2023-04-07 | Discharge: 2023-04-10 | DRG: 807 | Disposition: A | Discharge status: Home / Self Care | Payer: Medicaid Other | Attending: Family Medicine | Admitting: Family Medicine

## 2023-04-07 ENCOUNTER — Encounter (HOSPITAL_COMMUNITY): Payer: Self-pay | Admitting: Family Medicine

## 2023-04-07 DIAGNOSIS — D573 Sickle-cell trait: Secondary | ICD-10-CM | POA: Diagnosis present

## 2023-04-07 DIAGNOSIS — O26893 Other specified pregnancy related conditions, third trimester: Secondary | ICD-10-CM | POA: Diagnosis present

## 2023-04-07 DIAGNOSIS — Z803 Family history of malignant neoplasm of breast: Secondary | ICD-10-CM | POA: Diagnosis not present

## 2023-04-07 DIAGNOSIS — Z3490 Encounter for supervision of normal pregnancy, unspecified, unspecified trimester: Principal | ICD-10-CM

## 2023-04-07 DIAGNOSIS — Z87891 Personal history of nicotine dependence: Secondary | ICD-10-CM

## 2023-04-07 DIAGNOSIS — Z3A39 39 weeks gestation of pregnancy: Secondary | ICD-10-CM | POA: Diagnosis not present

## 2023-04-07 DIAGNOSIS — O9902 Anemia complicating childbirth: Secondary | ICD-10-CM | POA: Diagnosis present

## 2023-04-07 DIAGNOSIS — Z832 Family history of diseases of the blood and blood-forming organs and certain disorders involving the immune mechanism: Secondary | ICD-10-CM | POA: Diagnosis not present

## 2023-04-07 LAB — CBC
HCT: 35.7 % — ABNORMAL LOW (ref 36.0–46.0)
Hemoglobin: 11.7 g/dL — ABNORMAL LOW (ref 12.0–15.0)
MCH: 26.8 pg (ref 26.0–34.0)
MCHC: 32.8 g/dL (ref 30.0–36.0)
MCV: 81.7 fL (ref 80.0–100.0)
Platelets: 203 10*3/uL (ref 150–400)
RBC: 4.37 MIL/uL (ref 3.87–5.11)
RDW: 24.4 % — ABNORMAL HIGH (ref 11.5–15.5)
WBC: 10.8 10*3/uL — ABNORMAL HIGH (ref 4.0–10.5)
nRBC: 0 % (ref 0.0–0.2)

## 2023-04-07 LAB — TYPE AND SCREEN
ABO/RH(D): O POS
Antibody Screen: NEGATIVE

## 2023-04-07 MED ORDER — ONDANSETRON HCL 4 MG/2ML IJ SOLN
4.0000 mg | Freq: Four times a day (QID) | INTRAMUSCULAR | Status: DC | PRN
Start: 2023-04-07 — End: 2023-04-08

## 2023-04-07 MED ORDER — ACETAMINOPHEN 325 MG PO TABS: 650.0000 mg | ORAL_TABLET | ORAL | Status: DC | PRN

## 2023-04-07 MED ORDER — OXYTOCIN-SODIUM CHLORIDE 30-0.9 UT/500ML-% IV SOLN
2.5000 [IU]/h | INTRAVENOUS | Status: DC
  Filled 2023-04-07: qty 500

## 2023-04-07 MED ORDER — MISOPROSTOL 25 MCG QUARTER TABLET
25.0000 ug | ORAL_TABLET | Freq: Once | ORAL | Status: AC
  Administered 2023-04-07: 25 ug via VAGINAL
  Filled 2023-04-07: qty 1

## 2023-04-07 MED ORDER — OXYCODONE-ACETAMINOPHEN 5-325 MG PO TABS: 2.0000 | ORAL_TABLET | ORAL | Status: DC | PRN

## 2023-04-07 MED ORDER — LACTATED RINGERS IV SOLN: INTRAVENOUS | Status: DC

## 2023-04-07 MED ORDER — SOD CITRATE-CITRIC ACID 500-334 MG/5ML PO SOLN: 30.0000 mL | ORAL | Status: DC | PRN

## 2023-04-07 MED ORDER — TERBUTALINE SULFATE 1 MG/ML IJ SOLN: 0.2500 mg | Freq: Once | INTRAMUSCULAR | Status: DC | PRN

## 2023-04-07 MED ORDER — LACTATED RINGERS IV SOLN: 500.0000 mL | INTRAVENOUS | Status: DC | PRN

## 2023-04-07 MED ORDER — MISOPROSTOL 50MCG HALF TABLET
50.0000 ug | ORAL_TABLET | Freq: Once | ORAL | Status: AC
  Administered 2023-04-07: 50 ug via ORAL
  Filled 2023-04-07: qty 1

## 2023-04-07 MED ORDER — LIDOCAINE HCL (PF) 1 % IJ SOLN: 30.0000 mL | INTRAMUSCULAR | Status: DC | PRN

## 2023-04-07 MED ORDER — OXYTOCIN BOLUS FROM INFUSION
333.0000 mL | Freq: Once | INTRAVENOUS | Status: AC
  Administered 2023-04-08: 333 mL via INTRAVENOUS

## 2023-04-07 MED ORDER — OXYCODONE-ACETAMINOPHEN 5-325 MG PO TABS: 1.0000 | ORAL_TABLET | ORAL | Status: DC | PRN
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
## 2023-04-08 ENCOUNTER — Encounter (HOSPITAL_COMMUNITY): Payer: Self-pay | Admitting: Obstetrics and Gynecology

## 2023-04-08 ENCOUNTER — Inpatient Hospital Stay (HOSPITAL_COMMUNITY): Payer: Medicaid Other | Admitting: Anesthesiology

## 2023-04-08 DIAGNOSIS — Z3A39 39 weeks gestation of pregnancy: Secondary | ICD-10-CM

## 2023-04-08 LAB — RPR: RPR Ser Ql: NONREACTIVE

## 2023-04-08 MED ORDER — FENTANYL-BUPIVACAINE-NACL 0.5-0.125-0.9 MG/250ML-% EP SOLN
12.0000 mL/h | EPIDURAL | Status: DC | PRN
  Filled 2023-04-08: qty 250

## 2023-04-08 MED ORDER — ZOLPIDEM TARTRATE 5 MG PO TABS: 5.0000 mg | ORAL_TABLET | Freq: Every evening | ORAL | Status: DC | PRN

## 2023-04-08 MED ORDER — WITCH HAZEL-GLYCERIN EX PADS: 1.0000 | MEDICATED_PAD | CUTANEOUS | Status: DC | PRN

## 2023-04-08 MED ORDER — LIDOCAINE HCL (PF) 1 % IJ SOLN
INTRAMUSCULAR | Status: DC | PRN
  Administered 2023-04-08: 2 mL via EPIDURAL
  Administered 2023-04-08: 8 mL via EPIDURAL

## 2023-04-08 MED ORDER — BENZOCAINE-MENTHOL 20-0.5 % EX AERO
1.0000 | INHALATION_SPRAY | CUTANEOUS | Status: DC | PRN
  Administered 2023-04-09: 1 via TOPICAL
  Filled 2023-04-08: qty 56

## 2023-04-08 MED ORDER — EPHEDRINE 5 MG/ML INJ
10.0000 mg | INTRAVENOUS | Status: DC | PRN
Start: 2023-04-08 — End: 2023-04-08

## 2023-04-08 MED ORDER — ACETAMINOPHEN 325 MG PO TABS
650.0000 mg | ORAL_TABLET | ORAL | Status: DC | PRN
  Administered 2023-04-08 – 2023-04-10 (×2): 650 mg via ORAL
  Filled 2023-04-08 (×2): qty 2

## 2023-04-08 MED ORDER — PHENYLEPHRINE 80 MCG/ML (10ML) SYRINGE FOR IV PUSH (FOR BLOOD PRESSURE SUPPORT): 80.0000 ug | PREFILLED_SYRINGE | INTRAVENOUS | Status: DC | PRN

## 2023-04-08 MED ORDER — LACTATED RINGERS IV SOLN
500.0000 mL | Freq: Once | INTRAVENOUS | Status: AC
  Administered 2023-04-08: 500 mL via INTRAVENOUS

## 2023-04-08 MED ORDER — FENTANYL-BUPIVACAINE-NACL 0.5-0.125-0.9 MG/250ML-% EP SOLN
EPIDURAL | Status: DC | PRN
  Administered 2023-04-08: 12 mL/h via EPIDURAL

## 2023-04-08 MED ORDER — ONDANSETRON HCL 4 MG PO TABS: 4.0000 mg | ORAL_TABLET | ORAL | Status: DC | PRN

## 2023-04-08 MED ORDER — FENTANYL CITRATE (PF) 100 MCG/2ML IJ SOLN
100.0000 ug | Freq: Once | INTRAMUSCULAR | Status: AC
  Administered 2023-04-08: 100 ug via INTRAVENOUS

## 2023-04-08 MED ORDER — PRENATAL MULTIVITAMIN CH
1.0000 | ORAL_TABLET | Freq: Every day | ORAL | Status: DC
  Administered 2023-04-08 – 2023-04-10 (×3): 1 via ORAL
  Filled 2023-04-08 (×3): qty 1

## 2023-04-08 MED ORDER — SIMETHICONE 80 MG PO CHEW: 80.0000 mg | CHEWABLE_TABLET | ORAL | Status: DC | PRN

## 2023-04-08 MED ORDER — DIPHENHYDRAMINE HCL 50 MG/ML IJ SOLN: 12.5000 mg | INTRAMUSCULAR | Status: DC | PRN

## 2023-04-08 MED ORDER — FENTANYL CITRATE (PF) 100 MCG/2ML IJ SOLN
INTRAMUSCULAR | Status: AC
  Filled 2023-04-08: qty 2

## 2023-04-08 MED ORDER — EPHEDRINE 5 MG/ML INJ: 10.0000 mg | INTRAVENOUS | Status: DC | PRN

## 2023-04-08 MED ORDER — TETANUS-DIPHTH-ACELL PERTUSSIS 5-2.5-18.5 LF-MCG/0.5 IM SUSY: 0.5000 mL | PREFILLED_SYRINGE | Freq: Once | INTRAMUSCULAR | Status: DC

## 2023-04-08 MED ORDER — DIPHENHYDRAMINE HCL 25 MG PO CAPS
25.0000 mg | ORAL_CAPSULE | Freq: Four times a day (QID) | ORAL | Status: DC | PRN
  Administered 2023-04-08: 25 mg via ORAL
  Filled 2023-04-08: qty 1

## 2023-04-08 MED ORDER — ONDANSETRON HCL 4 MG/2ML IJ SOLN: 4.0000 mg | INTRAMUSCULAR | Status: DC | PRN

## 2023-04-08 MED ORDER — IBUPROFEN 600 MG PO TABS
600.0000 mg | ORAL_TABLET | Freq: Four times a day (QID) | ORAL | Status: DC
  Administered 2023-04-08 – 2023-04-10 (×9): 600 mg via ORAL
  Filled 2023-04-08 (×9): qty 1

## 2023-04-08 MED ORDER — COCONUT OIL OIL
1.0000 | TOPICAL_OIL | Status: DC | PRN
  Administered 2023-04-09: 1 via TOPICAL

## 2023-04-08 MED ORDER — SENNOSIDES-DOCUSATE SODIUM 8.6-50 MG PO TABS
2.0000 | ORAL_TABLET | ORAL | Status: DC
  Administered 2023-04-08 – 2023-04-10 (×3): 2 via ORAL
  Filled 2023-04-08 (×3): qty 2

## 2023-04-08 MED ORDER — DIBUCAINE (PERIANAL) 1 % EX OINT
1.0000 | TOPICAL_OINTMENT | CUTANEOUS | Status: DC | PRN
Start: 2023-04-08 — End: 2023-04-10

## 2023-04-08 NOTE — Anesthesia Preprocedure Evaluation (Signed)
Anesthesia Evaluation  Patient identified by MRN, date of birth, ID band Patient awake    Reviewed: Allergy & Precautions, Patient's Chart, lab work & pertinent test results  Airway Mallampati: II  TM Distance: >3 FB Neck ROM: Full    Dental no notable dental hx.    Pulmonary asthma , former smoker   Pulmonary exam normal breath sounds clear to auscultation       Cardiovascular negative cardio ROS Normal cardiovascular exam Rhythm:Regular Rate:Normal     Neuro/Psych negative neurological ROS  negative psych ROS   GI/Hepatic negative GI ROS, Neg liver ROS,,,  Endo/Other  negative endocrine ROS    Renal/GU negative Renal ROS  negative genitourinary   Musculoskeletal negative musculoskeletal ROS (+)    Abdominal   Peds negative pediatric ROS (+)  Hematology negative hematology ROS (+)   Anesthesia Other Findings   Reproductive/Obstetrics (+) Pregnancy 3rd baby, attempted water birth now requesting epidural at 7cm                             Anesthesia Physical Anesthesia Plan  ASA: 2  Anesthesia Plan: Epidural   Post-op Pain Management:    Induction:   PONV Risk Score and Plan: 2  Airway Management Planned: Natural Airway  Additional Equipment: None  Intra-op Plan:   Post-operative Plan:   Informed Consent: I have reviewed the patients History and Physical, chart, labs and discussed the procedure including the risks, benefits and alternatives for the proposed anesthesia with the patient or authorized representative who has indicated his/her understanding and acceptance.       Plan Discussed with:   Anesthesia Plan Comments:        Anesthesia Quick Evaluation

## 2023-04-08 NOTE — Anesthesia Procedure Notes (Signed)
Epidural Patient location during procedure: OB Start time: 04/08/2023 5:59 AM End time: 04/08/2023 6:10 AM  Staffing Anesthesiologist: Lannie Fields, DO Performed: anesthesiologist   Preanesthetic Checklist Completed: patient identified, IV checked, risks and benefits discussed, monitors and equipment checked, pre-op evaluation and timeout performed  Epidural Patient position: sitting Prep: DuraPrep and site prepped and draped Patient monitoring: continuous pulse ox, blood pressure, heart rate and cardiac monitor Approach: midline Location: L3-L4 Injection technique: LOR air  Needle:  Needle type: Tuohy  Needle gauge: 17 G Needle length: 9 cm Needle insertion depth: 6 cm Catheter type: closed end flexible Catheter size: 19 Gauge Catheter at skin depth: 11 cm Test dose: negative  Assessment Sensory level: T8 Events: blood not aspirated, no cerebrospinal fluid, injection not painful, no injection resistance, no paresthesia and negative IV test  Additional Notes Patient identified. Risks/Benefits/Options discussed with patient including but not limited to bleeding, infection, nerve damage, paralysis, failed block, incomplete pain control, headache, blood pressure changes, nausea, vomiting, reactions to medication both or allergic, itching and postpartum back pain. Confirmed with bedside nurse the patient's most recent platelet count. Confirmed with patient that they are not currently taking any anticoagulation, have any bleeding history or any family history of bleeding disorders. Patient expressed understanding and wished to proceed. All questions were answered. Sterile technique was used throughout the entire procedure. Please see nursing notes for vital signs. Test dose was given through epidural catheter and negative prior to continuing to dose epidural or start infusion. Warning signs of high block given to the patient including shortness of breath, tingling/numbness in  hands, complete motor block, or any concerning symptoms with instructions to call for help. Patient was given instructions on fall risk and not to get out of bed. All questions and concerns addressed with instructions to call with any issues or inadequate analgesia.  Reason for block:procedure for pain

## 2023-04-08 NOTE — Lactation Note (Signed)
This note was copied from a baby's chart. Lactation Consultation Note  Patient Name: Girl Nayela Stoica ZOXWR'U Date: 04/08/2023 Age:34 hours Reason for consult: Initial assessment;Term.  Infant currently receiving phototherapy due to high billi level 1st day of life. MOB has decided not to latch infant at the breast until bilirubin levels are normal, she plans to exclusively pump and use donor breast milk. LC gave handout with "Feeding Amounts" on Day 1 of life MOB knows to offer infant 5-15 mls per feeding or more of EBM/Donor milk. LC reviewed hand expression with breast model and MOB self expressed 3 mls of colostrum.  MOB was set up with DEBP with 24 mm breast flange and pumped additional 1 ml, infant was bottle feed 4 mls of colostrum and 6 mls of donor breast milk this feeding using White Nfant bottle nipple. Afterwards, infant had small emesis of clear mucus, Per MOB, infant has been spitty. MOB is experienced with breastfeeding she BF her 1st child for 6 months and 2nd child for 4-6 months, her 2nd child is currently 2 years old. LC made MOB hands free bra for pumping. MOB was  made aware of O/P services, breastfeeding support groups, community resources, and our phone # for post-discharge questions.    MOB knows that her EBM is safe for 4 hours at room temperature whereas donor breast milk once warmed must be used within 1 hour.   Today's Current Feeding Plan:  1- MOB will continue to feed infant by cues, every 2-3 hours, MOB will offer any EBM that is pumped 1st and then donor breast milk. 2- Once infant is off bili light MOB will resume latching infant at the breast. 3- MOB will continue to use DEBP every 3 hours for 15 minutes on initial setting.  Maternal Data Has patient been taught Hand Expression?: Yes  Feeding Mother's Current Feeding Choice: Breast Milk and Donor Milk Nipple Type: Nfant Standard Flow (white)  LATCH Score  MOB is currently not latching infant at the breast.                    Lactation Tools Discussed/Used Tools: Flanges;Pump Flange Size: 24 Breast pump type: Double-Electric Breast Pump Pump Education: Setup, frequency, and cleaning;Milk Storage Reason for Pumping: MOM's choice infant on billi lights and MOB decided to pump only as long as infant under billi lights. Once infant is no longer having  phototherapy she will resume latching infant at the breast. Pumping frequency: MOB will continue to pump every 3 hours for 15 minutes and give infant her EBM first and then donor breast milk  Interventions    Discharge Pump: DEBP;Personal  Consult Status Consult Status: Follow-up Date: 04/09/23    SHAMBRA JARCHOW 04/08/2023, 5:35 PM

## 2023-04-08 NOTE — Discharge Summary (Signed)
Postpartum Discharge Summary  Date of Service updated***     Patient Name: Audrey Pearson DOB: 1991/02/12 MRN: [id number]  Date of admission: 04/07/2023 Delivery date:04/08/2023 Delivering provider: Wynelle Bourgeois Date of discharge: 04/08/2023  Admitting diagnosis: Pregnant [Z34.90] Intrauterine pregnancy: [redacted]w[redacted]d     Secondary diagnosis:  Principal Problem:   Pregnant Active Problems:   Vaginal delivery  Additional problems: Shoulder Dystocia    Discharge diagnosis: Term Pregnancy Delivered                                              Post partum procedures:{Postpartum procedures:23558} Augmentation: Cytotec Complications: {OB Labor/Delivery Complications:20784}Shoulder dystocia  Hospital course: Induction of Labor With Vaginal Delivery   32 y.o. yo [id number] at [redacted]w[redacted]d was admitted to the hospital 04/07/2023 for induction of labor.  Indication for induction: Elective.  Patient had an labor course complicated by nothing until delivery when she had a shoulder dystocia.  Had planned a waterbirth.  Got in tub for a short while but decided she wanted an epidural  Membrane Rupture Time/Date: 5:20 AM,04/08/2023  Delivery Method:Vaginal, Spontaneous Operative Delivery:{Operative Delivery:30121} Episiotomy: None Lacerations:  None Details of delivery can be found in separate delivery note.  Patient had a postpartum course complicated by***. Patient is discharged home 04/08/23.  Newborn Data: Birth date:04/08/2023 Birth time:7:23 AM Gender:Female Living status:Living Apgars:6 ,9  Weight:   Magnesium Sulfate received: No BMZ received: No Rhophylac:No MMR:{MMR:30440033} T-DaP:{Tdap:23962} Flu: {JYN:82956} RSV Vaccine received: {RSV:31013} Transfusion:{Transfusion received:30440034} Immunizations administered: Immunization History  Administered Date(s) Administered   Pneumococcal Polysaccharide-23 12/21/2016, 05/20/2018   Tdap 08/13/2015, 10/15/2016, 03/04/2018, 01/06/2021     Physical exam  Vitals:   04/08/23 0700 04/08/23 0731 04/08/23 0750 04/08/23 0801  BP: 111/66 (!) 119/59 109/68 (!) 104/56  Pulse: (!) 113 (!) 111 (!) 107 86  Resp:  16 15 16   Temp:      TempSrc:      SpO2:      Weight:      Height:       General: {Exam; general:21111117} Lochia: {Desc; appropriate/inappropriate:30686::"appropriate"} Uterine Fundus: {Desc; firm/soft:30687} Incision: {Exam; incision:21111123} DVT Evaluation: {Exam; dvt:2111122} Labs: Lab Results  Component Value Date   WBC 10.8 (H) 04/07/2023   HGB 11.7 (L) 04/07/2023   HCT 35.7 (L) 04/07/2023   MCV 81.7 04/07/2023   PLT 203 04/07/2023      Latest Ref Rng & Units 08/18/2022    4:28 PM  CMP  Glucose 70 - 99 mg/dL 89   BUN 6 - 20 mg/dL 5   Creatinine 2.13 - 0.86 mg/dL 5.78   Sodium 469 - 629 mmol/L 137   Potassium 3.5 - 5.1 mmol/L 3.8   Chloride 98 - 111 mmol/L 102   CO2 22 - 32 mmol/L 20   Calcium 8.9 - 10.3 mg/dL 9.6   Total Protein 6.5 - 8.1 g/dL 6.9   Total Bilirubin 0.3 - 1.2 mg/dL 0.9   Alkaline Phos 38 - 126 U/L 54   AST 15 - 41 U/L 15   ALT 0 - 44 U/L 11    Edinburgh Score:    06/23/2018   11:10 AM  Edinburgh Postnatal Depression Scale Screening Tool  I have been able to laugh and see the funny side of things. 0  I have looked forward with enjoyment to things. 0  I have blamed myself unnecessarily when  things went wrong. 0  I have been anxious or worried for no good reason. 0  I have felt scared or panicky for no good reason. 0  Things have been getting on top of me. 0  I have been so unhappy that I have had difficulty sleeping. 0  I have felt sad or miserable. 0  I have been so unhappy that I have been crying. 1  The thought of harming myself has occurred to me. 0  Edinburgh Postnatal Depression Scale Total 1      After visit meds:  Allergies as of 04/08/2023       Reactions   Bee Venom      Med Rec must be completed prior to using this  [facility]***        Discharge home in stable condition Infant Feeding: Breast Infant Disposition:{CHL IP OB HOME WITH EAVWUJ:81191} Discharge instruction: per After Visit Summary and Postpartum booklet. Activity: Advance as tolerated. Pelvic rest for 6 weeks.  Diet: routine diet Anticipated Birth Control:  vasectomy Postpartum Appointment:{Outpatient follow up:23559} Additional Postpartum F/U: {PP Procedure:23957} Future Appointments: Future Appointments  Date Time Provider Department Center  04/09/2023 10:35 AM Celedonio Savage, MD [facility] None  04/16/2023  9:55 AM Joanne Gavel, MD [facility] None  05/30/2023 10:15 AM Barbaraann Faster, PT [facility] None  06/06/2023 10:15 AM Barbaraann Faster, PT [facility] None  06/13/2023 10:15 AM Barbaraann Faster, PT [facility] None  06/20/2023 10:15 AM Barbaraann Faster, PT [facility] None   Follow up Visit:      04/08/2023 Wynelle Bourgeois, CNM

## 2023-04-08 NOTE — Progress Notes (Signed)
LABOR PROGRESS NOTE  Patient Name: Audrey Pearson, female   DOB: [birth year], 32 y.o.  MRN: [id number]  [id number] at [redacted]w[redacted]d admitted for eIOL  S: Feeling painful, regular ctx. Nitrous  O:  BP 116/70   Pulse 92   Temp 99.2 F (37.3 C) (Oral)   Resp 18   Ht 5\' 6"  (1.676 m)   Wt 83 kg   LMP 07/08/2022   SpO2 98%   BMI 29.53 kg/m  EFM:125 bpm/Moderate variability/ 15x15 accels/ None decels CAT: 1 Toco: regular, every 2-3 minutes   CVE: Dilation: 4 Effacement (%): 70 Station: -3 Presentation: Vertex Exam by:: Simmie Davies RN   A&P:   #Labor: Progressing well. Still contracting regularly. CNM to sign water birth consent and move into tub soon #Pain: Nitrous oxide #FWB: CAT 1 #GBS negative #Anticipate vaginal delivery  Joanne Gavel, MD FMOB Fellow, Faculty practice [facility], Center for [facility] 04/08/23  2:08 AM

## 2023-04-08 NOTE — Progress Notes (Signed)
Patient ID: Audrey Pearson, female   DOB: 1990/08/25, 32 y.o.   MRN: [id number] Requesting epidural  Does not want to pursue waterbirth  Vitals:   04/08/23 0426 04/08/23 0430  BP:    Pulse:  (!) 110  Resp: 18   Temp: 98.7 F (37.1 C)   SpO2:     FHR stable UCs q31m  SROM clear fluid  Will turn care over to Dr Ilsa Iha, Elby Showers, CNM

## 2023-04-08 NOTE — Anesthesia Postprocedure Evaluation (Signed)
Anesthesia Post Note  Patient: Audrey Pearson  Procedure(s) Performed: AN AD HOC LABOR EPIDURAL     Patient location during evaluation: Mother Baby Anesthesia Type: Epidural Level of consciousness: awake and alert Pain management: pain level controlled Vital Signs Assessment: post-procedure vital signs reviewed and stable Respiratory status: spontaneous breathing, nonlabored ventilation and respiratory function stable Cardiovascular status: stable Postop Assessment: no headache, no backache, no apparent nausea or vomiting, adequate PO intake and able to ambulate Anesthetic complications: no   No notable events documented.  Last Vitals:  Vitals:   04/08/23 1429 04/08/23 1834  BP: (!) 105/54 121/69  Pulse: 91 96  Resp: 18 18  Temp: 37.4 C 36.7 C  SpO2: 98%     Last Pain:  Vitals:   04/08/23 1834  TempSrc: Oral  PainSc:    Pain Goal:                   Blythe Stanford

## 2023-04-08 NOTE — Plan of Care (Signed)
Earlene Plater, RN

## 2023-04-08 NOTE — Progress Notes (Signed)
Patient ID: Audrey Pearson, female   DOB: 04-02-1991, 32 y.o.   MRN: [id number] Saw patient at 0220. Discussed waterbirth and signed consent form She states she still wants to pursue waterbirth Has been using Nitrous Oxide for pain management and just requested Fentanyl  Vitals:   04/08/23 0007 04/08/23 0213 04/08/23 0426 04/08/23 0430  BP:  115/63    Pulse:  81  (!) 110  Resp:  18 18   Temp:  98.3 F (36.8 C) 98.7 F (37.1 C)   TempSrc:  Axillary Oral   SpO2: 98%     Weight:      Height:       FHR reassuring, average variability No decels, accels present  Dilation: 7 Effacement (%): 100 Station: -2 Presentation: Vertex Exam by:: Simmie Davies RN  RN States patient still wants to do waterbirth Will need to wait until Fentanyl wears off. Will observe  Aviva Signs, CNM

## 2023-04-09 ENCOUNTER — Encounter: Payer: Medicaid Other | Admitting: Family Medicine

## 2023-04-09 NOTE — Progress Notes (Signed)
CSW followed up with parents at [facility] bedside. CSW introduced self and explained role. CSW inquired about how parents were doing, parents reported that they were doing fine. CSW informed parents about the NICU, what to expect, and supports available while infant is admitted to the NICU. Parents denied any questions regarding the NICU at this time. CSW emphasized the importance of parents caring for themselves during this time. CSW encouraged parents to notify CSW if any needs/concerns arise.   Celso Sickle, LCSW Clinical Social Worker [facility] Cell#: [phone]
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
## 2023-04-09 NOTE — Lactation Note (Signed)
This note was copied from a baby's chart.  NICU Lactation Consultation Note  Patient Name: Audrey Pearson [id number] Date: 04/09/2023 Age:34 hours  Reason for consult: Follow-up assessment; Term; NICU baby; Other (Comment); Hyperbilirubinemia (Transfer from the MBU)  SUBJECTIVE Visited with family of 36 hours old FT NICU female; Audrey Pearson is a P3 and reports she was putting baby to breast yesterday prior NICU admission, she's been pumping ever since, baby "Audrey Pearson" is under the lights due to triple phototherapy. She reported that pumping is going well, although it could get a bit painful sometimes; she just started using coconut oil to ease that discomfort (see maternal assessment). Let her know that this LC can come back to observe a pumping session, she mentioned she has "elastic nipples"; size # 24 seems appropriate at this time. Assisted with hand expression and she was able to get small droplets of colostrum, praised her for her efforts. Reviewed pumping schedule, pumping log, lactogenesis II, hyperbilirubinemia and anticipatory guidelines.   OBJECTIVE Infant data: Mother's Current Feeding Choice: Breast Milk and Donor Milk  O2 Device: Room Air  Infant feeding assessment Scale for Readiness: 1 Scale for Quality: 3   Maternal data: [id number] Vaginal, Spontaneous Has patient been taught Hand Expression?: Yes Hand Expression Comments: LC used breast model and MOB self expressed 3 mls of colostrum. Significant Breast History:: (+) breast changes during the pregnancy, she also pumped prenatally Current breast feeding challenges:: NICU admission Does the patient have breastfeeding experience prior to this delivery?: Yes Pumping frequency: 4 times/24 hours Pumped volume: 0 mL (drops) Flange Size: 24 Risk factor for low/delayed milk supply:: infant separation  Pump: Personal  ASSESSMENT Infant: Feeding Status: Ad lib Feeding method: Bottle Nipple Type: Slow - flow  Maternal: Milk  volume: Normal Both nipples are intact; no S/S of nipple trauma  INTERVENTIONS/PLAN Interventions: Interventions: Breast feeding basics reviewed; Coconut oil; DEBP; Education; NICU Pumping Log Tools: Pump; Flanges; Coconut oil Pump Education: Setup, frequency, and cleaning; Milk Storage  Plan: Encouraged pumping every 3 hours, ideally 8 pumping sessions/24 hours Breast massage, hand expression and coconut oil were also encouraged prior pumping She'll call for assistance when baby Audrey Pearson is ready to go back to breast  FOB present. All questions and concerns answered, family to contact [facility] services PRN.  Consult Status: NICU follow-up NICU Follow-up type: New admission follow up   Koston Hennes S Philis Nettle 04/09/2023, 4:11 PM

## 2023-04-09 NOTE — Progress Notes (Signed)
Post Partum Day 1 Subjective: no complaints  Objective: Blood pressure 98/62, pulse 76, temperature 98.5 F (36.9 C), temperature source Oral, resp. rate 18, height 5\' 6"  (1.676 m), weight 83 kg, last menstrual period 07/08/2022, SpO2 97%, unknown if currently breastfeeding.  Physical Exam:  General: alert, cooperative, and no distress Lochia: appropriate Uterine Fundus: firm Incision:  DVT Evaluation: No evidence of DVT seen on physical exam.  Recent Labs    04/07/23 1859  HGB 11.7*  HCT 35.7*    Assessment/Plan: Plan for discharge tomorrow   LOS: 2 days   Scheryl Darter, MD 04/09/2023, 9:42 AM

## 2023-04-09 NOTE — Progress Notes (Signed)
CSW attempted to follow up with parents, no one at MOB's bedside. CSW checked in infant's room, MOB present. MOB shared that FOB has not arrived yet. CSW agreed to check back at a later time, MOB agreeable and denied any current needs.   Celso Sickle, LCSW Clinical Social Worker [facility] Cell#: [phone]

## 2023-04-10 ENCOUNTER — Other Ambulatory Visit: Payer: Self-pay

## 2023-04-10 MED ORDER — IBUPROFEN 600 MG PO TABS: 600.0000 mg | ORAL_TABLET | Freq: Four times a day (QID) | ORAL | 0 refills | Status: AC

## 2023-04-10 NOTE — Lactation Note (Addendum)
This note was copied from a baby's chart.  NICU Lactation Consultation Note  Patient Name: Audrey Pearson Today's Date: 04/10/2023 Age:34 hours  Reason for consult: Maternal discharge; Term; Other (Comment); NICU baby; Hyperbilirubinemia; Follow-up assessment (infant on phototherapy, DAT+)  SUBJECTIVE P3 - LC consulted with mom in NICU room. Infant is 27 hours old and on phototherapy protocol. Mom is to discharge today from [facility]. Discussed pumping frequency and output. Mom reports expressing 70ml at 1100am pump session and is pumping about 6 times a day (does not wake overnight to pump but will begin to try). Mom showed LC the expressed breast milk. LC praised mom for great output. Reviewed importance of pumping frequency including overnight. Mom reports that her nipples continue to expand towards the back of the pump flange connector. Mom reports that she feels her nipples are elastic. Reviewed sizing down the flange to 21mm but to lubricate with coconut oil first. Mom updated LC that infant is taking maternal breast milk adlib and receives donor milk as needed.  LC Mili reminded mom to switch pump settings to maintain mode in which she has already done including to remove all pump parts from the pump lid (caps and tubing) to transport to NICU room for use.  Reviewed engorgement prevention and expectations over the next several days.  Mom understanding of all education  OBJECTIVE Infant data: Mother's Current Feeding Choice: Breast Milk and Donor Milk  O2 Device: Room Air  Infant feeding assessment Scale for Readiness: 1 Scale for Quality: 2   Maternal data: [id number] Vaginal, Spontaneous Has patient been taught Hand Expression?: Yes Significant Breast History:: (+) breast changes during the pregnancy, she also pumped prenatally Current breast feeding challenges:: infant is on triple phototherapy; mom desires to pause nursing at the breast until infant is off phototherapy Does the  patient have breastfeeding experience prior to this delivery?: Yes Pumping frequency: every 3 hours; at least 6 times per 24 hours Pumped volume: 70 mL Flange Size: 24 (may size down to 21 due to nipple elasticity reported by mom) Risk factor for low/delayed milk supply:: infant separation  Pump: Personal, Hands Free (Medela Free style Hands Free; Medela Pump in Style)  ASSESSMENT Infant:    Feeding Status: Ad lib Feeding method: Bottle Nipple Type: Slow - flow  Maternal: Milk volume: Normal  INTERVENTIONS/PLAN Interventions: Interventions: Breast feeding basics reviewed; Education Discharge Education: Engorgement and breast care Tools: Flanges; Pump; Coconut oil Pump Education: Setup, frequency, and cleaning  Plan:  When visiting infant in NICU, pump at the bedside. Maintain 8 pumping sessions per 24 hours Add in breast massage and/or hand expression Apply cold compresses to the breasts to help relief breast engorgement/inflammation Call out to lactation to observe a pumping session due to the elastic nipples  Lactation to be consulted PRN. Mom verbalized understanding of all education. Consult Status: NICU follow-up NICU Follow-up type: Weekly NICU follow up; Verify absence of engorgement; Verify onset of copious milk   T'Errah N Torie Towle 04/10/2023, 12:28 PM

## 2023-04-10 NOTE — Discharge Summary (Addendum)
Postpartum Discharge Summary    Patient Name: Audrey Pearson DOB: 05/19/1990 MRN: [id number]  Date of admission: 04/07/2023 Delivery date:04/08/2023 Delivering provider: Wynelle Bourgeois Date of discharge: 04/10/2023  Admitting diagnosis: Pregnant [Z34.90] Intrauterine pregnancy: [redacted]w[redacted]d     Secondary diagnosis:  Principal Problem:   Pregnant Active Problems:   Vaginal delivery  Additional problems: n/a     Discharge diagnosis: Term Pregnancy Delivered                                              Post partum procedures: n/a  Augmentation: Cytotec Complications: None  Hospital course: Induction of Labor With Vaginal Delivery   32 y.o. yo [id number] at [redacted]w[redacted]d was admitted to the hospital 04/07/2023 for induction of labor.  Indication for induction: Elective.  Patient had an labor course complicated by shoulder dystocia requiring McRoberts, SP pressure, Rubins and posterior axilla grasp with rocking motion side to side to deliver.  Membrane Rupture Time/Date: 5:20 AM,04/08/2023  Delivery Method:Vaginal, Spontaneous Operative Delivery:N/A Episiotomy: None Lacerations:  None Details of delivery can be found in separate delivery note.  Patient had a postpartum course complicated by n/a. Patient is discharged home 04/10/23.  Newborn Data: Birth date:04/08/2023 Birth time:7:23 AM Gender:Female Living status:Living Apgars:6 ,9  Weight:3540 g  Magnesium Sulfate received: No BMZ received: No Rhophylac:N/A MMR:N/A T-DaP: n/a Flu: No RSV Vaccine received: No Transfusion:No  Immunizations received: Immunization History  Administered Date(s) Administered   Pneumococcal Polysaccharide-23 12/21/2016, 05/20/2018   Tdap 08/13/2015, 10/15/2016, 03/04/2018, 01/06/2021    Physical exam  Vitals:   04/09/23 0529 04/09/23 1248 04/09/23 2057 04/10/23 0530  BP: 98/62 121/81 (!) 93/54 (!) 93/53  Pulse: 76 86 83 90  Resp: 18 18 19 18   Temp: 98.5 F (36.9 C) 98.5 F (36.9 C) 98.4 F  (36.9 C) 97.6 F (36.4 C)  TempSrc: Oral Oral Oral Axillary  SpO2: 97% 99% 97% 98%  Weight:      Height:       General: alert, cooperative, and no distress Lochia: appropriate Uterine Fundus: firm Incision: N/A DVT Evaluation: No evidence of DVT seen on physical exam. Labs: Lab Results  Component Value Date   WBC 10.8 (H) 04/07/2023   HGB 11.7 (L) 04/07/2023   HCT 35.7 (L) 04/07/2023   MCV 81.7 04/07/2023   PLT 203 04/07/2023      Latest Ref Rng & Units 08/18/2022    4:28 PM  CMP  Glucose 70 - 99 mg/dL 89   BUN 6 - 20 mg/dL 5   Creatinine 4.74 - 2.59 mg/dL 5.63   Sodium 875 - 643 mmol/L 137   Potassium 3.5 - 5.1 mmol/L 3.8   Chloride 98 - 111 mmol/L 102   CO2 22 - 32 mmol/L 20   Calcium 8.9 - 10.3 mg/dL 9.6   Total Protein 6.5 - 8.1 g/dL 6.9   Total Bilirubin 0.3 - 1.2 mg/dL 0.9   Alkaline Phos 38 - 126 U/L 54   AST 15 - 41 U/L 15   ALT 0 - 44 U/L 11    Edinburgh Score:    04/09/2023   12:55 PM  Edinburgh Postnatal Depression Scale Screening Tool  I have been able to laugh and see the funny side of things. 0  I have looked forward with enjoyment to things. 0  I have blamed myself unnecessarily when things  went wrong. 1  I have been anxious or worried for no good reason. 2  I have felt scared or panicky for no good reason. 1  Things have been getting on top of me. 1  I have been so unhappy that I have had difficulty sleeping. 0  I have felt sad or miserable. 2  I have been so unhappy that I have been crying. 1  The thought of harming myself has occurred to me. 0  Edinburgh Postnatal Depression Scale Total 8   Edinburgh Postnatal Depression Scale Total: 8   After visit meds:  Allergies as of 04/10/2023       Reactions   Bee Venom         Medication List     STOP taking these medications    Blood Pressure Kit Devi   NIFEdipine 10 MG capsule Commonly known as: PROCARDIA       TAKE these medications    Airsupra 90-80 MCG/ACT Aero Generic  drug: Albuterol-Budesonide Inhale into the lungs.   albuterol 108 (90 Base) MCG/ACT inhaler Commonly known as: VENTOLIN HFA Inhale 2 puffs into the lungs every 4 (four) hours as needed for wheezing or shortness of breath.   Ferric Maltol 30 MG Caps Take 1 capsule (30 mg total) by mouth 2 (two) times daily. Please take one hour before breakfast and dinner   hydrocortisone 2.5 % rectal cream Commonly known as: ANUSOL-HC Place rectally 2 (two) times daily.   ibuprofen 600 MG tablet Commonly known as: ADVIL Take 1 tablet (600 mg total) by mouth every 6 (six) hours.         Discharge home in stable condition Infant Feeding: Breast Infant Disposition:NICU Discharge instruction: per After Visit Summary and Postpartum booklet. Activity: Advance as tolerated. Pelvic rest for 6 weeks.  Diet: routine diet Future Appointments: Future Appointments  Date Time Provider Department Center  05/17/2023 10:55 AM Sue Lush, FNP [facility] None  05/30/2023 10:15 AM Barbaraann Faster, PT [facility] None  06/06/2023 10:15 AM Barbaraann Faster, PT [facility] None  06/13/2023 10:15 AM Barbaraann Faster, PT [facility] None  06/20/2023 10:15 AM Barbaraann Faster, PT [facility] None   Follow up Visit: Message to [facility] 11/27  Please schedule this patient for a In person postpartum visit in 4 weeks with the following provider: Any provider. Additional Postpartum F/U:Postpartum Depression checkup  Low risk pregnancy complicated by:  baby in NICU Delivery mode:  Vaginal, Spontaneous Anticipated Birth Control:   vasectomy    04/10/2023 Hessie Dibble, MD

## 2023-04-11 ENCOUNTER — Ambulatory Visit (HOSPITAL_COMMUNITY): Payer: Self-pay

## 2023-04-11 NOTE — Lactation Note (Signed)
This note was copied from a baby's chart.  NICU Lactation Consultation Note  Patient Name: Audrey Pearson [id number] Date: 04/11/2023 Age:34 hours  Reason for consult: Follow-up assessment; NICU baby; Hyperbilirubinemia; Other (Comment); Term; Mother's request (DAT (+))  SUBJECTIVE Visited with family of 57 hours old FT NICU female; Audrey Pearson is a P3 and wanted to show this LC the bottles she pumped earlier, she's been pumping consistently day and night and her supply continues to increase, praised her for her efforts. She denies any S/S of engorgement at this time and reported that nipple pain/sensitivity has shown improvement since she started limiting her pumping sessions to 30 minutes at a time. She will stay with # 24 flanges since the # 21 was too narrow for her elastic nipples. She tried warm compresses when her breasts got really full since the ice felt uncomfortable with her breasts. Revised the difference between being "full" and being "engorged" and how to proceed in each case. Baby "Audrey Pearson" is still undergoing phototherapy.  OBJECTIVE Infant data: Mother's Current Feeding Choice: Breast Milk  O2 Device: Room Air  Infant feeding assessment Scale for Readiness: 2 Scale for Quality: 1   Maternal data: [id number] Vaginal, Spontaneous Has patient been taught Hand Expression?: Yes Current breast feeding challenges:: infant is on triple phototherapy; mom desires to pause nursing at the breast until infant is off phototherapy Pumping frequency: 7-8 times/24 hours Pumped volume: 90 mL Flange Size: 24 Hands-free pumping top sizes: Small/Medium (Blue)  Pump: Personal, Hands Free (Medela Free style Hands Free; Medela Pump in Style)  ASSESSMENT Infant: Feeding Status: Ad lib Feeding method: Bottle Nipple Type: Slow - flow  Maternal: Milk volume: Abundant  INTERVENTIONS/PLAN Interventions: Interventions: Breast feeding basics reviewed; Coconut oil; DEBP; Education Discharge  Education: Engorgement and breast care Tools: Pump; Flanges; Coconut oil; Hands-free pumping top Pump Education: Setup, frequency, and cleaning; Milk Storage  Plan: Encouraged to continue pumping every 3 hours, ideally 8 pumping sessions/24 hours Breast massage, hand expression and coconut oil were also encouraged prior pumping She'll apply ice packs in case of engorgement She'll call for assistance when baby Audrey Pearson is ready to go back to breast   FOB present and supportive. All questions and concerns answered, family to contact [facility] services PRN.  Consult Status: NICU follow-up NICU Follow-up type: Weekly NICU follow up   [name] 04/11/2023, 11:33 AM

## 2023-04-16 ENCOUNTER — Encounter: Payer: Medicaid Other | Admitting: Obstetrics and Gynecology

## 2023-04-17 ENCOUNTER — Telehealth: Payer: Self-pay | Admitting: *Deleted

## 2023-04-17 NOTE — Telephone Encounter (Signed)
Pt called to office stating she is PP and having HA's.  Attempt to return call. LVM asking pt to check BP now that she is delivered, may be shifts in hormones, make sure she is eating/drinking ok and she may take Tylenol or Ibuprofen as needed.  Advised to call with any other concerns she may have.

## 2023-04-19 ENCOUNTER — Telehealth (HOSPITAL_COMMUNITY): Payer: Self-pay | Admitting: *Deleted

## 2023-04-19 NOTE — Telephone Encounter (Signed)
04/19/2023  Name: AMILIAH MCLAIN MRN: [id number] DOB: 1990-07-06  Reason for Call:  Transition of Care Hospital Discharge Call  Contact Status: Patient Contact Status: Message  Language assistant needed:          Follow-Up Questions:    Inocente Salles Postnatal Depression Scale:  In the Past 7 Days:    PHQ2-9 Depression Scale:     Discharge Follow-up:    Post-discharge interventions: NA  Salena Saner, RN 04/19/2023 12:27

## 2023-04-21 ENCOUNTER — Inpatient Hospital Stay (HOSPITAL_COMMUNITY): Payer: Medicaid Other

## 2023-05-16 NOTE — Progress Notes (Signed)
 Post Partum Visit Note  Audrey Pearson is a 33 y.o. [phone] female who presents for a postpartum visit. She is [redacted]w[redacted]d postpartum following a normal spontaneous vaginal delivery.  I have fully reviewed the prenatal and intrapartum course. The delivery was at [redacted]w[redacted]d gestational weeks.  Anesthesia: epidural. Postpartum course has been okay. Baby is doing well. Baby is feeding by breast. Patient's periods have returned. Bowel function is normal. Bladder function is normal. Patient is sexually active at 4 weeks postpartum. Contraception method is vasectomy. Postpartum depression screening: negative.       Health Maintenance Due  Topic Date Due   INFLUENZA VACCINE  Never done   COVID-19 Vaccine (1 - 2024-25 season) Never done    The following portions of the patient's history were reviewed and updated as appropriate: allergies, current medications, past family history, past medical history, past social history, past surgical history, and problem list.  Review of Systems Pertinent items are noted in HPI. Pertinent items noted in HPI and remainder of comprehensive ROS otherwise negative.  Objective:  LMP 07/08/2022    General:  alert, cooperative, and appears stated age   Breasts:  deferred  Lungs: clear to auscultation bilaterally  Heart:  regular rate and rhythm, S1, S2 normal, no murmur, click, rub or gallop  Abdomen: soft, non-tender; bowel sounds normal; no masses,  no organomegaly   Wound N/A  GU exam:  not indicated       Assessment:   1. Encounter for routine postpartum follow-up (Primary) Patient stable and okay mood postpartum. Feels safe at home with partner and has support system in family and friends that she can talk with. Discussed resources and tools available including IBH. Negative depression screening. Unremarkable postpartum physical exam.  -Emphasized importance of f/u colposcopy, noted in wrap-up to schedule with MD.   Plan:   Essential components of care  per ACOG recommendations:  1.  Mood and well being: Patient with negative depression screening today. Reviewed local resources for support.  - Patient tobacco use? Former cigarette use.   - hx of drug use? Former alcohol, marijuana  2. Infant care and feeding:  -Patient currently breastmilk feeding? Yes. Reviewed importance of draining breast regularly to support lactation.  -Social determinants of health (SDOH) reviewed in EPIC. No concerns identified.   3. Sexuality, contraception and birth spacing - Patient does not want a pregnancy in the next year.  Desired family size is 3 children.  - Reviewed reproductive life planning. Reviewed contraceptive methods based on pt preferences and effectiveness.  Patient desired Vasectomy today.   - Discussed birth spacing of 18 months  4. Sleep and fatigue -Encouraged family/partner/community support of 4 hrs of uninterrupted sleep to help with mood and fatigue  5. Physical Recovery  - Discussed patients delivery and complications. She describes her labor as bad due interpersonal conflict with partner.  - Patient had a vaginal delivery with shoulder dystocia. Patient had   no  laceration. Perineal healing reviewed. Patient expressed understanding - Patient has urinary incontinence? No. - Patient is safe to resume physical and sexual activity  6.  Health Maintenance - HM due items addressed Yes - Last pap smear   Diagnosis  Date Value Ref Range Status  09/17/2022 - Low grade squamous intraepithelial lesion (LSIL) (A)  Final   Pap smear not indicated at today's visit.   -Breast Cancer screening indicated? No.   7. Chronic Disease/Pregnancy Condition follow up: None  - PCP follow up  Lashaye Fisk E  Nicholaus, PA-C Center for [employer], [facility]

## 2023-05-17 ENCOUNTER — Ambulatory Visit (INDEPENDENT_AMBULATORY_CARE_PROVIDER_SITE_OTHER): Payer: Medicaid Other | Admitting: Obstetrics and Gynecology

## 2023-05-17 ENCOUNTER — Encounter: Payer: Self-pay | Admitting: Obstetrics and Gynecology

## 2023-05-17 NOTE — Progress Notes (Deleted)
    Post Partum Visit Note  Audrey Pearson is a 33 y.o. [phone] female who presents for a postpartum visit. She is 5 weeks postpartum following a normal spontaneous vaginal delivery.  I have fully reviewed the prenatal and intrapartum course. The delivery was at 39 gestational weeks.  Anesthesia: epidural. Postpartum course has been good. Baby is doing well. Baby is feeding by breast. Bleeding  heavy bleeding since 06/13/22 . Bowel function is normal. Bladder function is normal. Patient is sexually active. Contraception method is condoms and vasectomy. Postpartum depression screening: negative.   The pregnancy intention screening data noted above was reviewed. Potential methods of contraception were discussed. The patient elected to proceed with No data recorded.    Health Maintenance Due  Topic Date Due   INFLUENZA VACCINE  Never done   COVID-19 Vaccine (1 - 2024-25 season) Never done    {Common ambulatory SmartLinks:19316}  Review of Systems {ros; complete:30496}  Objective:  LMP 07/08/2022    General:  {gen appearance:16600}   Breasts:  {desc; normal/abnormal/not indicated:14647}  Lungs: {lung exam:16931}  Heart:  {heart exam:5510}  Abdomen: {abdomen exam:16834}   Wound {Wound assessment:11097}  GU exam:  {desc; normal/abnormal/not indicated:14647}       Assessment:    There are no diagnoses linked to this encounter.  *** postpartum exam.   Plan:   Essential components of care per ACOG recommendations:  1.  Mood and well being: Patient with {gen negative/positive:315881} depression screening today. Reviewed local resources for support.  - Patient tobacco use? {tobacco use:25506}  - hx of drug use? {yes/no:25505}    2. Infant care and feeding:  -Patient currently breastmilk feeding? {yes/no:25502}  -Social determinants of health (SDOH) reviewed in EPIC. No concerns***The following needs were identified***  3. Sexuality, contraception and birth spacing - Patient  {DOES_DOES WNU:81435} want a pregnancy in the next year.  Desired family size is {NUMBER 1-10:22536} children.  - Reviewed reproductive life planning. Reviewed contraceptive methods based on pt preferences and effectiveness.  Patient desired {[employer] Methods:24109} today.   - Discussed birth spacing of 18 months  4. Sleep and fatigue -Encouraged family/partner/community support of 4 hrs of uninterrupted sleep to help with mood and fatigue  5. Physical Recovery  - Discussed patients delivery and complications. She describes her labor as {description:25511} - Patient had a {CHL AMB DELIVERY:[phone]}. Patient had a {laceration:25518} laceration. Perineal healing reviewed. Patient expressed understanding - Patient has urinary incontinence? {yes/no:25515} - Patient {ACTION; IS/IS WNU:[id number]} safe to resume physical and sexual activity  6.  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  -Breast Cancer screening indicated? {indicated:25516}  7. Chronic Disease/Pregnancy Condition follow up: {Follow up:25499}  - PCP follow up  Alvia Rosina GAILS, CMA Center for [facility], [facility], [facility]

## 2023-05-21 NOTE — Progress Notes (Signed)
Attestation of visit: Intake and evaluation was performed by the nurse.  I have reviewed the note and the chart, and I agree with the management and plan.   Albertine Grates, FNP [facility] for [employer], [facility]

## 2023-05-30 ENCOUNTER — Other Ambulatory Visit (INDEPENDENT_AMBULATORY_CARE_PROVIDER_SITE_OTHER): Payer: Self-pay | Admitting: Advanced Practice Midwife

## 2023-05-30 ENCOUNTER — Ambulatory Visit: Payer: Medicaid Other | Attending: Advanced Practice Midwife | Admitting: Physical Therapy

## 2023-05-30 DIAGNOSIS — R279 Unspecified lack of coordination: Secondary | ICD-10-CM | POA: Diagnosis present

## 2023-05-30 DIAGNOSIS — M62838 Other muscle spasm: Secondary | ICD-10-CM | POA: Insufficient documentation

## 2023-05-30 DIAGNOSIS — R293 Abnormal posture: Secondary | ICD-10-CM | POA: Diagnosis present

## 2023-05-30 DIAGNOSIS — M6281 Muscle weakness (generalized): Secondary | ICD-10-CM | POA: Diagnosis present
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
## 2023-06-06 ENCOUNTER — Ambulatory Visit: Payer: Medicaid Other | Admitting: Physical Therapy

## 2023-06-13 ENCOUNTER — Ambulatory Visit: Payer: Medicaid Other | Admitting: Physical Therapy

## 2023-06-13 DIAGNOSIS — M6281 Muscle weakness (generalized): Secondary | ICD-10-CM

## 2023-06-13 DIAGNOSIS — R279 Unspecified lack of coordination: Secondary | ICD-10-CM

## 2023-06-13 DIAGNOSIS — R293 Abnormal posture: Secondary | ICD-10-CM

## 2023-06-13 DIAGNOSIS — M62838 Other muscle spasm: Secondary | ICD-10-CM
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
## 2023-06-20 ENCOUNTER — Encounter: Payer: Self-pay | Admitting: Physical Therapy

## 2023-06-20 ENCOUNTER — Ambulatory Visit: Payer: Medicaid Other | Attending: Advanced Practice Midwife | Admitting: Physical Therapy

## 2023-06-20 DIAGNOSIS — R293 Abnormal posture: Secondary | ICD-10-CM | POA: Diagnosis present

## 2023-06-20 DIAGNOSIS — M6281 Muscle weakness (generalized): Secondary | ICD-10-CM | POA: Diagnosis present

## 2023-06-20 DIAGNOSIS — R279 Unspecified lack of coordination: Secondary | ICD-10-CM | POA: Insufficient documentation
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
## 2023-07-08 ENCOUNTER — Ambulatory Visit (INDEPENDENT_AMBULATORY_CARE_PROVIDER_SITE_OTHER): Payer: Medicaid Other | Admitting: Family Medicine

## 2023-07-08 ENCOUNTER — Encounter: Payer: Self-pay | Admitting: Family Medicine

## 2023-07-08 ENCOUNTER — Other Ambulatory Visit (HOSPITAL_COMMUNITY)
Admission: RE | Admit: 2023-07-08 | Discharge: 2023-07-08 | Disposition: A | Discharge status: Home / Self Care | Payer: Medicaid Other | Source: Ambulatory Visit | Attending: Family Medicine | Admitting: Family Medicine

## 2023-07-08 VITALS — BP 125/70 | HR 84 | Ht 66.0 in | Wt 163.0 lb

## 2023-07-08 DIAGNOSIS — R87612 Low grade squamous intraepithelial lesion on cytologic smear of cervix (LGSIL): Secondary | ICD-10-CM | POA: Insufficient documentation

## 2023-07-08 DIAGNOSIS — Z3202 Encounter for pregnancy test, result negative: Secondary | ICD-10-CM

## 2023-07-08 DIAGNOSIS — Z9889 Other specified postprocedural states: Secondary | ICD-10-CM

## 2023-07-08 LAB — POCT URINE PREGNANCY: Preg Test, Ur: NEGATIVE

## 2023-07-08 MED ORDER — IBUPROFEN 600 MG PO TABS: 600.0000 mg | ORAL_TABLET | Freq: Four times a day (QID) | ORAL | 1 refills | Status: AC | PRN

## 2023-07-08 NOTE — Addendum Note (Signed)
 Addended by: Lyndee Hensen on: 07/08/2023 01:44 PM   Modules accepted: Orders

## 2023-07-08 NOTE — Addendum Note (Signed)
 Addended by: Sundra Aland on: 07/08/2023 01:43 PM   Modules accepted: Orders

## 2023-07-08 NOTE — Addendum Note (Signed)
 Addended by: Salome Holmes on: 07/08/2023 01:37 PM   Modules accepted: Orders

## 2023-07-08 NOTE — Progress Notes (Signed)
 Wants to discuss Tubal Ligation today.

## 2023-07-08 NOTE — Progress Notes (Signed)
    GYNECOLOGY CLINIC COLPOSCOPY PROCEDURE NOTE  33 y.o. [id number] here for colposcopy for pap finding of:  Result Date Procedure Results Follow-ups  09/17/2022 Cytology - PAP( [facility]) Adequacy: Satisfactory for evaluation; transformation zone component PRESENT. Diagnosis: - Low grade squamous intraepithelial lesion (LSIL) (A)   11/08/2020 Cytology - PAP( [facility]) High risk HPV: Positive (A) HPV 16: Negative HPV 18 / 45: Negative Adequacy: Satisfactory for evaluation; transformation zone component PRESENT. Diagnosis: - Negative for intraepithelial lesion or malignancy (NILM) Comment: Normal Reference Range HPV - Negative Comment: Normal Reference Range HPV 16 18 45 -Negative   06/08/2016 Cytology - PAP Adequacy: Satisfactory for evaluation  endocervical/transformation zone component PRESENT. Diagnosis: NEGATIVE FOR INTRAEPITHELIAL LESIONS OR MALIGNANCY. Material Submitted: CervicoVaginal Pap [ThinPrep Imaged]     Discussed role for HPV in cervical dysplasia, need for surveillance, nature of the procedure, and risks and benefits.  Pregnancy test: Lab Results  Component Value Date   PREGTESTUR POSITIVE (A) 08/18/2022    Allergies  Allergen Reactions   Bee Venom     Patient given informed consent, signed copy in the chart, time out was performed.    Placed in lithotomy position. Cervix viewed with speculum and colposcope after application of acetic acid.   Colposcopy Adequacy Cervix fully visualized: Yes  SCJ fully visualized: Yes  Colposcopy Findings visible lesion(s) at 3 o'clock -- exophytic lesions c/w condylomatous changes noted  Corresponding biopsies were obtained.    ECC specimen was not obtained.  All specimens were labeled and sent to pathology.  Hemostatic measures: Pressure, Monsel's solution, and Silver nitrate  Complications: none  Patient tolerated the procedure fairly well.  Physical Exam Genitourinary:     Genitourinary Comments: SCJ seen,  no significant acetowhite changes noted, exophytic condylomatous changes noted at 3 o'clock     Cervix is parous.  Exam conducted with a chaperone present.    Colposcopy Impressions Normal colposcopy Biopsies x 4 obtained, will await final path  Plan Treatment plan pending biopsy results, per patient preference they will be communicated by MyChart or phone pending results.  Patient was given post procedure instructions.  Will follow up pathology and manage accordingly; patient will be contacted with results and recommendations.  Routine preventative health maintenance measures emphasized.   Sundra Aland, MD

## 2023-07-11 LAB — SURGICAL PATHOLOGY

## 2023-07-11 NOTE — Progress Notes (Signed)
 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
